# Patient Record
Sex: Female | Born: 1950
Health system: Southern US, Community
[De-identification: ages and names within clinical notes are randomized; demographics above are authoritative.]

## PROBLEM LIST (undated history)

## (undated) DIAGNOSIS — E669 Obesity, unspecified: Secondary | ICD-10-CM

## (undated) DIAGNOSIS — I4892 Unspecified atrial flutter: Secondary | ICD-10-CM

## (undated) DIAGNOSIS — I517 Cardiomegaly: Secondary | ICD-10-CM

## (undated) DIAGNOSIS — I10 Essential (primary) hypertension: Secondary | ICD-10-CM

## (undated) DIAGNOSIS — I639 Cerebral infarction, unspecified: Secondary | ICD-10-CM

## (undated) DIAGNOSIS — I4891 Unspecified atrial fibrillation: Secondary | ICD-10-CM

## (undated) HISTORY — DX: Cerebral infarction, unspecified: I63.9

## (undated) HISTORY — DX: Unspecified atrial flutter: I48.92

## (undated) HISTORY — DX: Cardiomegaly: I51.7

## (undated) HISTORY — PX: ABDOMINAL HYSTERECTOMY: SHX81

## (undated) HISTORY — DX: Essential (primary) hypertension: I10

## (undated) HISTORY — DX: Unspecified atrial fibrillation: I48.91

## (undated) HISTORY — DX: Obesity, unspecified: E66.9

---

## 1998-02-22 ENCOUNTER — Inpatient Hospital Stay (HOSPITAL_COMMUNITY): Admission: EM | Admit: 1998-02-22 | Discharge: 1998-02-23 | Payer: Self-pay | Admitting: *Deleted

## 1998-03-30 ENCOUNTER — Inpatient Hospital Stay (HOSPITAL_COMMUNITY): Admission: RE | Admit: 1998-03-30 | Discharge: 1998-04-01 | Payer: Self-pay | Admitting: Obstetrics and Gynecology

## 1999-11-05 ENCOUNTER — Encounter: Payer: Self-pay | Admitting: Obstetrics and Gynecology

## 1999-11-05 ENCOUNTER — Encounter: Admission: RE | Admit: 1999-11-05 | Discharge: 1999-11-05 | Payer: Self-pay | Admitting: Obstetrics and Gynecology

## 2001-01-19 ENCOUNTER — Encounter: Admission: RE | Admit: 2001-01-19 | Discharge: 2001-01-19 | Payer: Self-pay | Admitting: Obstetrics and Gynecology

## 2001-01-19 ENCOUNTER — Encounter: Payer: Self-pay | Admitting: Obstetrics and Gynecology

## 2002-01-25 ENCOUNTER — Encounter: Payer: Self-pay | Admitting: Obstetrics and Gynecology

## 2002-01-25 ENCOUNTER — Encounter: Admission: RE | Admit: 2002-01-25 | Discharge: 2002-01-25 | Payer: Self-pay | Admitting: Obstetrics and Gynecology

## 2002-10-24 ENCOUNTER — Encounter: Payer: Self-pay | Admitting: Emergency Medicine

## 2002-10-24 ENCOUNTER — Emergency Department (HOSPITAL_COMMUNITY): Admission: EM | Admit: 2002-10-24 | Discharge: 2002-10-24 | Payer: Self-pay | Admitting: Emergency Medicine

## 2003-01-31 ENCOUNTER — Encounter: Admission: RE | Admit: 2003-01-31 | Discharge: 2003-01-31 | Payer: Self-pay | Admitting: Obstetrics and Gynecology

## 2003-01-31 ENCOUNTER — Encounter: Payer: Self-pay | Admitting: Obstetrics and Gynecology

## 2004-02-13 ENCOUNTER — Ambulatory Visit (HOSPITAL_COMMUNITY): Admission: RE | Admit: 2004-02-13 | Discharge: 2004-02-13 | Payer: Self-pay | Admitting: Obstetrics and Gynecology

## 2005-03-04 ENCOUNTER — Ambulatory Visit (HOSPITAL_COMMUNITY): Admission: RE | Admit: 2005-03-04 | Discharge: 2005-03-04 | Payer: Self-pay | Admitting: Obstetrics and Gynecology

## 2006-03-10 ENCOUNTER — Ambulatory Visit (HOSPITAL_COMMUNITY): Admission: RE | Admit: 2006-03-10 | Discharge: 2006-03-10 | Payer: Self-pay | Admitting: Obstetrics and Gynecology

## 2007-03-16 ENCOUNTER — Ambulatory Visit (HOSPITAL_COMMUNITY): Admission: RE | Admit: 2007-03-16 | Discharge: 2007-03-16 | Payer: Self-pay | Admitting: Obstetrics and Gynecology

## 2008-03-21 ENCOUNTER — Ambulatory Visit (HOSPITAL_COMMUNITY): Admission: RE | Admit: 2008-03-21 | Discharge: 2008-03-21 | Payer: Self-pay | Admitting: Obstetrics and Gynecology

## 2008-05-22 ENCOUNTER — Ambulatory Visit: Payer: Self-pay | Admitting: Cardiology

## 2008-05-22 ENCOUNTER — Observation Stay (HOSPITAL_COMMUNITY): Admission: EM | Admit: 2008-05-22 | Discharge: 2008-05-23 | Payer: Self-pay | Admitting: Emergency Medicine

## 2008-05-30 ENCOUNTER — Ambulatory Visit: Payer: Self-pay | Admitting: Cardiology

## 2008-05-30 ENCOUNTER — Encounter: Payer: Self-pay | Admitting: Cardiology

## 2008-05-30 ENCOUNTER — Ambulatory Visit: Payer: Self-pay

## 2008-05-30 LAB — CONVERTED CEMR LAB
BUN: 12 mg/dL (ref 6–23)
CO2: 30 meq/L (ref 19–32)
Calcium: 9.4 mg/dL (ref 8.4–10.5)
Chloride: 104 meq/L (ref 96–112)
Potassium: 3.9 meq/L (ref 3.5–5.1)
Sodium: 140 meq/L (ref 135–145)

## 2008-06-16 ENCOUNTER — Ambulatory Visit: Payer: Self-pay | Admitting: Cardiology

## 2008-10-12 ENCOUNTER — Ambulatory Visit: Payer: Self-pay | Admitting: Cardiology

## 2008-10-12 DIAGNOSIS — I471 Supraventricular tachycardia, unspecified: Secondary | ICD-10-CM | POA: Insufficient documentation

## 2008-10-12 DIAGNOSIS — I10 Essential (primary) hypertension: Secondary | ICD-10-CM | POA: Insufficient documentation

## 2008-11-10 ENCOUNTER — Encounter: Payer: Self-pay | Admitting: Cardiology

## 2008-11-10 ENCOUNTER — Ambulatory Visit: Payer: Self-pay | Admitting: Cardiology

## 2008-11-10 ENCOUNTER — Encounter (INDEPENDENT_AMBULATORY_CARE_PROVIDER_SITE_OTHER): Payer: Self-pay

## 2008-11-10 DIAGNOSIS — E669 Obesity, unspecified: Secondary | ICD-10-CM | POA: Insufficient documentation

## 2008-12-29 ENCOUNTER — Encounter: Payer: Self-pay | Admitting: Cardiology

## 2008-12-29 ENCOUNTER — Ambulatory Visit: Payer: Self-pay | Admitting: Cardiology

## 2009-01-27 ENCOUNTER — Telehealth (INDEPENDENT_AMBULATORY_CARE_PROVIDER_SITE_OTHER): Payer: Self-pay | Admitting: *Deleted

## 2009-02-02 ENCOUNTER — Telehealth: Payer: Self-pay | Admitting: Cardiology

## 2009-02-15 ENCOUNTER — Telehealth: Payer: Self-pay | Admitting: Cardiology

## 2009-03-09 ENCOUNTER — Ambulatory Visit: Payer: Self-pay | Admitting: Cardiology

## 2009-03-09 DIAGNOSIS — I4891 Unspecified atrial fibrillation: Secondary | ICD-10-CM | POA: Insufficient documentation

## 2009-03-09 LAB — CONVERTED CEMR LAB
Chloride: 105 meq/L (ref 96–112)
Creatinine, Ser: 0.8 mg/dL (ref 0.4–1.2)
GFR calc non Af Amer: 95.1 mL/min (ref >60)
Potassium: 4.2 meq/L (ref 3.5–5.1)

## 2009-03-23 ENCOUNTER — Ambulatory Visit (HOSPITAL_COMMUNITY): Admission: RE | Admit: 2009-03-23 | Discharge: 2009-03-23 | Payer: Self-pay | Admitting: Obstetrics and Gynecology

## 2009-04-10 ENCOUNTER — Ambulatory Visit: Payer: Self-pay | Admitting: Cardiology

## 2009-05-12 ENCOUNTER — Telehealth: Payer: Self-pay | Admitting: Cardiology

## 2009-05-18 ENCOUNTER — Telehealth: Payer: Self-pay | Admitting: Cardiology

## 2009-05-23 ENCOUNTER — Ambulatory Visit: Payer: Self-pay | Admitting: Cardiology

## 2009-05-23 ENCOUNTER — Encounter: Payer: Self-pay | Admitting: Cardiology

## 2009-05-23 LAB — CONVERTED CEMR LAB: POC INR: 2

## 2009-05-29 ENCOUNTER — Ambulatory Visit: Payer: Self-pay | Admitting: Internal Medicine

## 2009-05-29 LAB — CONVERTED CEMR LAB: POC INR: 2

## 2009-06-05 ENCOUNTER — Ambulatory Visit: Payer: Self-pay | Admitting: Cardiology

## 2009-06-12 ENCOUNTER — Ambulatory Visit: Payer: Self-pay | Admitting: Cardiology

## 2009-06-19 ENCOUNTER — Ambulatory Visit: Payer: Self-pay | Admitting: Internal Medicine

## 2009-07-03 ENCOUNTER — Ambulatory Visit: Payer: Self-pay | Admitting: Cardiovascular Disease

## 2009-07-03 LAB — CONVERTED CEMR LAB: POC INR: 2

## 2009-07-10 ENCOUNTER — Ambulatory Visit: Payer: Self-pay | Admitting: Cardiology

## 2009-07-17 ENCOUNTER — Ambulatory Visit: Payer: Self-pay | Admitting: Internal Medicine

## 2009-07-24 ENCOUNTER — Ambulatory Visit: Payer: Self-pay | Admitting: Cardiology

## 2009-07-28 ENCOUNTER — Encounter: Payer: Self-pay | Admitting: Cardiology

## 2009-07-28 ENCOUNTER — Telehealth: Payer: Self-pay | Admitting: Cardiology

## 2009-07-31 ENCOUNTER — Ambulatory Visit: Payer: Self-pay | Admitting: Internal Medicine

## 2009-07-31 LAB — CONVERTED CEMR LAB: POC INR: 2.2

## 2009-08-03 ENCOUNTER — Encounter (INDEPENDENT_AMBULATORY_CARE_PROVIDER_SITE_OTHER): Payer: Self-pay | Admitting: Nurse Practitioner

## 2009-08-07 ENCOUNTER — Ambulatory Visit (HOSPITAL_COMMUNITY): Admission: RE | Admit: 2009-08-07 | Discharge: 2009-08-07 | Payer: Self-pay | Admitting: Cardiology

## 2009-08-07 ENCOUNTER — Encounter: Payer: Self-pay | Admitting: Cardiology

## 2009-08-14 ENCOUNTER — Ambulatory Visit: Payer: Self-pay | Admitting: Cardiology

## 2009-08-14 LAB — CONVERTED CEMR LAB: POC INR: 2.1

## 2009-08-21 ENCOUNTER — Encounter (INDEPENDENT_AMBULATORY_CARE_PROVIDER_SITE_OTHER): Payer: Self-pay | Admitting: Cardiology

## 2009-08-21 ENCOUNTER — Ambulatory Visit: Payer: Self-pay | Admitting: Cardiology

## 2009-08-28 ENCOUNTER — Ambulatory Visit: Payer: Self-pay | Admitting: Cardiovascular Disease

## 2009-08-28 LAB — CONVERTED CEMR LAB: POC INR: 2.9

## 2009-09-04 ENCOUNTER — Ambulatory Visit: Payer: Self-pay | Admitting: Cardiology

## 2009-09-04 LAB — CONVERTED CEMR LAB: POC INR: 2.5

## 2009-09-05 ENCOUNTER — Telehealth (INDEPENDENT_AMBULATORY_CARE_PROVIDER_SITE_OTHER): Payer: Self-pay | Admitting: *Deleted

## 2009-09-06 ENCOUNTER — Telehealth: Payer: Self-pay | Admitting: Cardiology

## 2009-09-07 ENCOUNTER — Telehealth: Payer: Self-pay | Admitting: Cardiology

## 2009-09-07 ENCOUNTER — Ambulatory Visit: Payer: Self-pay | Admitting: Cardiology

## 2009-09-07 ENCOUNTER — Ambulatory Visit (HOSPITAL_COMMUNITY): Admission: RE | Admit: 2009-09-07 | Discharge: 2009-09-07 | Payer: Self-pay | Admitting: Cardiology

## 2009-09-11 ENCOUNTER — Ambulatory Visit: Payer: Self-pay | Admitting: Internal Medicine

## 2009-09-11 ENCOUNTER — Encounter (INDEPENDENT_AMBULATORY_CARE_PROVIDER_SITE_OTHER): Payer: Self-pay | Admitting: *Deleted

## 2009-09-11 LAB — CONVERTED CEMR LAB: POC INR: 2.2

## 2009-09-18 ENCOUNTER — Telehealth: Payer: Self-pay | Admitting: Cardiology

## 2009-09-19 ENCOUNTER — Inpatient Hospital Stay (HOSPITAL_COMMUNITY): Admission: AD | Admit: 2009-09-19 | Discharge: 2009-09-22 | Payer: Self-pay | Admitting: Cardiology

## 2009-09-19 ENCOUNTER — Ambulatory Visit: Payer: Self-pay | Admitting: Cardiology

## 2009-09-20 ENCOUNTER — Encounter: Payer: Self-pay | Admitting: Cardiology

## 2009-09-25 ENCOUNTER — Telehealth: Payer: Self-pay | Admitting: Cardiology

## 2009-09-27 ENCOUNTER — Telehealth: Payer: Self-pay | Admitting: Cardiology

## 2009-10-02 ENCOUNTER — Ambulatory Visit: Payer: Self-pay | Admitting: Cardiology

## 2009-10-17 ENCOUNTER — Encounter (INDEPENDENT_AMBULATORY_CARE_PROVIDER_SITE_OTHER): Payer: Self-pay | Admitting: *Deleted

## 2009-10-17 ENCOUNTER — Ambulatory Visit: Payer: Self-pay | Admitting: Cardiology

## 2009-10-30 ENCOUNTER — Ambulatory Visit: Payer: Self-pay | Admitting: Internal Medicine

## 2009-10-30 LAB — CONVERTED CEMR LAB: POC INR: 2.2

## 2009-11-01 ENCOUNTER — Telehealth: Payer: Self-pay | Admitting: Cardiology

## 2009-11-27 ENCOUNTER — Ambulatory Visit: Payer: Self-pay | Admitting: Cardiology

## 2009-12-26 ENCOUNTER — Ambulatory Visit: Payer: Self-pay | Admitting: Cardiovascular Disease

## 2009-12-26 ENCOUNTER — Ambulatory Visit: Payer: Self-pay | Admitting: Cardiology

## 2009-12-26 LAB — CONVERTED CEMR LAB
Calcium: 9.1 mg/dL (ref 8.4–10.5)
Magnesium: 1.9 mg/dL (ref 1.5–2.5)
POC INR: 2
Potassium: 3.7 meq/L (ref 3.5–5.1)

## 2010-02-06 ENCOUNTER — Encounter: Payer: Self-pay | Admitting: Internal Medicine

## 2010-02-15 ENCOUNTER — Telehealth: Payer: Self-pay | Admitting: Cardiology

## 2010-02-20 ENCOUNTER — Encounter: Payer: Self-pay | Admitting: Cardiology

## 2010-02-26 ENCOUNTER — Telehealth: Payer: Self-pay | Admitting: Cardiology

## 2010-03-29 ENCOUNTER — Ambulatory Visit (HOSPITAL_COMMUNITY): Admission: RE | Admit: 2010-03-29 | Discharge: 2010-03-29 | Payer: Self-pay | Admitting: Obstetrics and Gynecology

## 2010-03-29 LAB — HM MAMMOGRAPHY: HM Mammogram: NORMAL

## 2010-07-03 ENCOUNTER — Encounter: Payer: Self-pay | Admitting: Cardiology

## 2010-07-03 ENCOUNTER — Ambulatory Visit: Payer: Self-pay | Admitting: Cardiology

## 2010-07-13 ENCOUNTER — Ambulatory Visit (HOSPITAL_COMMUNITY): Admission: RE | Admit: 2010-07-13 | Discharge: 2010-07-13 | Payer: Self-pay | Admitting: Cardiology

## 2010-07-13 ENCOUNTER — Encounter: Payer: Self-pay | Admitting: Cardiology

## 2010-07-13 ENCOUNTER — Ambulatory Visit: Payer: Self-pay

## 2010-07-13 ENCOUNTER — Ambulatory Visit: Payer: Self-pay | Admitting: Internal Medicine

## 2010-09-24 ENCOUNTER — Telehealth (INDEPENDENT_AMBULATORY_CARE_PROVIDER_SITE_OTHER): Payer: Self-pay | Admitting: *Deleted

## 2010-10-16 NOTE — Letter (Signed)
Summary: [employer]  [employer]   Imported By: Marylou Mccoy 11/02/2009 09:18:03  _____________________________________________________________________  External Attachment:    Type:   Image     Comment:   External Document

## 2010-10-16 NOTE — Medication Information (Signed)
Summary: [email or web address].mp  Anticoagulant Therapy  Managed by: Bethena Midget, RN, BSN Referring MD: Antoine Poche MD, Fayrene Fearing PCP: Alyce Pagan, MD Supervising MD: Jens Som MD, Arlys John Indication 1: Atrial Fibrillation Lab Used: [facility] [place] Site: [address] INR POC 2.2 INR RANGE 2.0-3.0  Dietary changes: no    Health status changes: no    Bleeding/hemorrhagic complications: no    Recent/future hospitalizations: no    Any changes in medication regimen? yes       Details: Ran out of Tikosyn, took last dose on Sunday AM. Schedule for BID. Ordered med pharmacy ran out  Recent/future dental: no  Any missed doses?: no       Is patient compliant with meds? yes       Allergies: No Known Drug Allergies  Anticoagulation Management History:      The patient is taking warfarin and comes in today for a routine follow up visit.  Negative risk factors for bleeding include an age less than 13 years old.  The bleeding index is 'low risk'.  Positive CHADS2 values include History of HTN.  Negative CHADS2 values include Age > 1 years old.  Anticoagulation responsible provider: Jens Som MD, Arlys John.  INR POC: 2.2.  Cuvette Lot#: [id number].  Exp: 12/2010.    Anticoagulation Management Assessment/Plan:      The patient's current anticoagulation dose is Coumadin 5 mg tabs: Take as directed by coumadin clinic..  The target INR is 2.0-3.0.  The next INR is due 11/27/2009.  Anticoagulation instructions were given to patient.  Results were reviewed/authorized by Bethena Midget, RN, BSN.  She was notified by Bethena Midget, RN, BSN.         Prior Anticoagulation Instructions: INR 2.8  Continue same dose of 1.5 tablets daily except 2 tablets on Mondays, Wednesdays, and Fridays. Recheck in 4 weeks.  Current Anticoagulation Instructions: INR 2.2 7.5mg s everyday excpet 10mg s on Mondays ,Wednesdays and Fridays. Recheck in 4 weeks.   Appended Document: rov.mp    Prescriptions: TIKOSYN 250 MCG CAPS  (DOFETILIDE) Take one capsule by mouth twice a day  #14 x 0   Entered by:   Bethena Midget, RN, BSN   Authorized by:   Shelby Dubin PharmD, BCPS, CPP   Signed by:   Bethena Midget, RN, BSN on 10/30/2009   Method used:   Samples Given   RxID:   [id number]  Tikosyn 250 micrograms caps #14 given to patient b/c retail pharmacy ran out and was unable to obtain prio r to patient missing 10/29/09 pm dose.  D/W Dr. Shirlee Latch who agreed to obtain sample and ask patient to resume normal dosing scheme.  Lot # [id number] / exp: 01/2011.  Pt verbalizes understanding of risks/benefits and agrees to continue dosing.  WCB in future if pharmacy not able to obtain.Shelby Dubin, PharmD

## 2010-10-16 NOTE — Letter (Signed)
Summary: Work Writer, Main Office  [address]   [place], [place] [address]   Phone: [phone]  Fax: [phone]         July 03, 2010    [facility]   The above named patient had a medical visit today 07/03/2010  Please take this into consideration when reviewing the time away from work/school.      Sincerely yours,     Sander Nephew, RN for Dr Rollene Rotunda [facility]

## 2010-10-16 NOTE — Medication Information (Signed)
Summary: [facility]  Anticoagulant Therapy  Managed by: Lew Dawes, PharmD Candidate Referring MD: Antoine Poche MD, Fayrene Fearing PCP: Alyce Pagan, MD Supervising MD: Jens Som MD, Arlys John Indication 1: Atrial Fibrillation Lab Used: [facility] [place] Site: [address] INR POC 2.8 INR RANGE 2.0-3.0  Dietary changes: no    Health status changes: no    Bleeding/hemorrhagic complications: no    Recent/future hospitalizations: yes       Details: Patient started Tikosyn. Was in hospital 1/4-1/7.  Any changes in medication regimen? yes       Details: Tikosyn bid.  Recent/future dental: no  Any missed doses?: no       Is patient compliant with meds? yes       Current Medications (verified): 1)  Benicar 40 Mg Tabs (Olmesartan Medoxomil) .... Take One Tablet By Mouth Daily 2)  Premarin 0.625 Mg Tabs (Estrogens Conjugated) .... One By Mouth Daily 3)  Coumadin 5 Mg Tabs (Warfarin Sodium) .... Take As Directed By Coumadin Clinic. 4)  Tikosyn 250 Mcg Caps (Dofetilide) .... Take One Capsule By Mouth Twice A Day  Allergies (verified): No Known Drug Allergies  Anticoagulation Management History:      The patient is taking warfarin and comes in today for a routine follow up visit.  Negative risk factors for bleeding include an age less than 57 years old.  The bleeding index is 'low risk'.  Positive CHADS2 values include History of HTN.  Negative CHADS2 values include Age > 45 years old.  Anticoagulation responsible provider: Jens Som MD, Arlys John.  INR POC: 2.8.  Cuvette Lot#: [id number].  Exp: 12/2010.    Anticoagulation Management Assessment/Plan:      The patient's current anticoagulation dose is Coumadin 5 mg tabs: Take as directed by coumadin clinic..  The target INR is 2.0-3.0.  The next INR is due 10/30/2009.  Anticoagulation instructions were given to patient.  Results were reviewed/authorized by Lew Dawes, PharmD Candidate.  She was notified by Lew Dawes, PharmD Candidate.         Prior Anticoagulation Instructions: INR 2.2  The patient is to continue with the same dose of coumadin.  This dosage includes: 1.5 tablets daily except 2 tablets on Monday, Wednesday, and Friday.  Pt will be admitted to the hospital on 09/19/09 to start Tikosyn.   Current Anticoagulation Instructions: INR 2.8  Continue same dose of 1.5 tablets daily except 2 tablets on Mondays, Wednesdays, and Fridays. Recheck in 4 weeks.

## 2010-10-16 NOTE — Medication Information (Signed)
Summary: [facility] Clinic  Anticoagulant Therapy  Managed by: Inactive Referring MD: Antoine Poche MD, Fayrene Fearing PCP: Alyce Pagan, MD Supervising MD: Excell Seltzer MD, Casimiro Needle Indication 1: Atrial Fibrillation Lab Used: [facility] [place] Site: [address] INR RANGE 2.0-3.0          Comments: Coumadin d/c'ed by Dr. Antoine Poche  Allergies: No Known Drug Allergies  Anticoagulation Management History:      Negative risk factors for bleeding include an age less than 59 years old.  The bleeding index is 'low risk'.  Positive CHADS2 values include History of HTN.  Negative CHADS2 values include Age > 59 years old.  Anticoagulation responsible provider: Excell Seltzer MD, Casimiro Needle.  Exp: 01/2011.    Anticoagulation Management Assessment/Plan:      The target INR is 2.0-3.0.  The next INR is due 01/23/2010.  Anticoagulation instructions were given to patient.  Results were reviewed/authorized by Inactive.         Prior Anticoagulation Instructions: INR 2.0  Continue on same dosage 1.5 tablets daily except 2 tablets on Mondays, Wednesdays, and Fridays.  Recheck in 4 weeks.

## 2010-10-16 NOTE — Progress Notes (Signed)
Summary: follow up letter  Phone Note Call from Patient Call back at [phone]   Reason for Call: Talk to Nurse Summary of Call: request to speak to nurse about letter she received Initial call taken by: Migdalia Dk,  February 26, 2010 4:41 PM  Follow-up for Phone Call        pt states she is not having palps and hasn't since she called last.  She will call back if palps re-occur Follow-up by: Charolotte Capuchin, RN,  February 26, 2010 4:46 PM

## 2010-10-16 NOTE — Progress Notes (Signed)
Summary: SOB  Phone Note Call from Patient Call back at Home Phone [phone] Call back at [phone]   Caller: Patient Summary of Call: PT HAVING SOB . Initial call taken by: Judie Grieve,  November 01, 2009 2:48 PM  Follow-up for Phone Call        Called patient, she states that she had problems with a refill for Tikosyn and missed one dose on Sunday. She states she told the [facility] Clinic about it on Monday and they gave her some samples to get her by for a short time until [facility] got in a supply. She states that she got a little upset on Tuesday after dinner and got a little SOB for a few minutes. This happened again after breakfast today. She denies being SOB currently. She denies any swelling in abdomen,legs, feet or ankles. She states that her heart rhythm feels fine and regular. She states that she thinks she had episodes of SOB related to being upset. Advised her to call us again if she has recurrent episodes of SOB. Follow-up by: Suzan Garibaldi RN

## 2010-10-16 NOTE — Progress Notes (Signed)
Summary: CALLING ABOUT APPT IN THE HOSPITAL lm to cb  Phone Note Call from Patient Call back at Home Phone [phone] Call back at [phone]   Caller: Patient Summary of Call: PT WOULD LIKE A CALL ABOUT APPT IN THE HOSPITAL 09/19/08 Initial call taken by: Judie Grieve,  September 18, 2009 9:43 AM  Follow-up for Phone Call        LM to C/B  Sander Nephew, RN Spoke with pt, aware admitting office will call her at home when a bed is available and she will report then to admitting.  Pt states understanding  Follow-up by: Charolotte Capuchin, RN,  September 18, 2009 10:22 AM

## 2010-10-16 NOTE — Progress Notes (Signed)
Summary: Pt was having palp onTuesday  NEEDS EVENT MONITOR  Phone Note Call from Patient Call back at Home Phone [phone] Call back at [phone]   Caller: Patient Summary of Call: Pt having palp Initial call taken by: Judie Grieve,  February 15, 2010 11:35 AM  Follow-up for Phone Call        having "palpitations and beating real fast"  after lunch on Tuesday.  Lasted 5 seconds and then again for 5 - 10 sec.  Yesterday she rested, didnt go to work and didnt have any.  None today.  Medicine helps but doesn't take care of it completely.  No chest pain no SOB.  Pt would like Dr Antoine Poche to be aware and see if he wants her to change anything.  Will forward to Dr Antoine Poche Follow-up by: Charolotte Capuchin, RN,  February 15, 2010 12:15 PM  Additional Follow-up for Phone Call Additional follow up Details #1::        Check event monitor. Additional Follow-up by: Rollene Rotunda, MD, [facility],  February 15, 2010 6:07 PM    Additional Follow-up for Phone Call Additional follow up Details #2::    left message on voicemail to call back  Sander Nephew, RN  returning call [phone], call after 4:30, Migdalia Dk  February 16, 2010 2:58 PM   phone now does not accept incoming calls.  letter will be mailed Follow-up by: Charolotte Capuchin, RN,  February 20, 2010 6:00 PM

## 2010-10-16 NOTE — Assessment & Plan Note (Signed)
Summary: EPH/JML   Visit Type:  Follow-up Primary Provider:  Alyce Pagan, MD  CC:  Atrial Fibrillation.  History of Present Illness: The patient presents for followup of atrial fibrillation. She was hospitalized early in January for a tedious and load. She converted to sinus rhythm with this medication. She had previously failed direct current cardioversion. Since that time she has had a few episodes were she thought she was out of rhythm. One lasting about 40 minutes. Overall she feels much better. She has much more energy. She has been dieting and lost 10 pounds. She has been exercising. She has had no chest pressure, neck or arm discomfort. She has had no palpitations, presyncope or syncope. She has no PND or orthopnea. She has no shortness of breath.  Current Medications (verified): 1)  Benicar 40 Mg Tabs (Olmesartan Medoxomil) .... Take One Tablet By Mouth Daily 2)  Premarin 0.625 Mg Tabs (Estrogens Conjugated) .... One By Mouth Daily 3)  Coumadin 5 Mg Tabs (Warfarin Sodium) .... Take As Directed By Coumadin Clinic. 4)  Tikosyn 250 Mcg Caps (Dofetilide) .... Take One Capsule By Mouth Twice A Day  Allergies (verified): No Known Drug Allergies  Past History:  Past Medical History: Reviewed history from 04/10/2009 and no changes required. PAT (ICD-427.0) HYPERTENSION, BENIGN (ICD-401.1) Mild Anemia LVH mild Atrial Fibrillation  Past Surgical History: Reviewed history from 12/29/2008 and no changes required. Hysterectomy  Review of Systems       As stated in the HPI and negative for all other systems.   Vital Signs:  Patient profile:   60 year old female Height:      63 inches Weight:      168 pounds BMI:     29.87 Pulse rate:   73 / minute Resp:     16 per minute BP sitting:   157 / 87  (right arm)  Vitals Entered By: Marrion Coy, CNA (October 17, 2009 8:52 AM)  Physical Exam  General:  Well developed, well nourished, in no acute distress. Head:   normocephalic and atraumatic Eyes:  PERRLA/EOM intact; conjunctiva and lids normal. Mouth:  Upper dentures, lower partial, gums and palate normal. Oral mucosa normal. Neck:  Neck supple, no JVD. No masses, thyromegaly or abnormal cervical nodes. Lungs:  Clear bilaterally to auscultation and percussion. Abdomen:  Bowel sounds positive; abdomen soft and non-tender without masses, organomegaly, or hernias noted. No hepatosplenomegaly. Msk:  Back normal, normal gait. Muscle strength and tone normal. Extremities:  No clubbing or cyanosis. Neurologic:  Alert and oriented x 3. Skin:  Intact without lesions or rashes. Psych:  Normal affect.   Detailed Cardiovascular Exam  Neck    Carotids: Carotids full and equal bilaterally without bruits.      Neck Veins: Normal, no JVD.    Heart    Inspection: no deformities or lifts noted.      Palpation: normal PMI with no thrills palpable.      Auscultation: regular rate and rhythm, S1, S2 without murmurs, rubs, gallops, or clicks.    Vascular    Abdominal Aorta: no palpable masses, pulsations, or audible bruits.      Femoral Pulses: normal femoral pulses bilaterally.      Pedal Pulses: normal pedal pulses bilaterally.      Radial Pulses: normal radial pulses bilaterally.      Peripheral Circulation: no clubbing, cyanosis, or edema noted with normal capillary refill.     EKG  Procedure date:  10/17/2009  Findings:  Sinus rhythm, premature atrial contractions, QTC 440 unchanged from posthospitalization, lateral deep T-wave inversions unchanged from previous  Impression & Recommendations:  Problem # 1:  ATRIAL FIBRILLATION (ICD-427.31)  The patient is tolerating Tikosyn and is maintaining sinus rhythm though she may have occasional breakthroughs of fibrillation. For now I will continue Coumadin. She will continue the current dose of Tikosyn.  No further evaluation is needed at this time.  Orders: EKG w/ Interpretation (93000)  Problem  # 2:  HYPERTENSION, BENIGN (ICD-401.1) Her blood pressure is slightly elevated. However, this has not been the case at recent visits or during her hospitalization. I have asked her to get a blood pressure cuff. I will not make any further changes.  Patient Instructions: 1)  Your physician recommends that you schedule a follow-up appointment in: 4 months with Dr Antoine Poche 2)  Your physician recommends that you continue on your current medications as directed. Please refer to the Current Medication list given to you today. 3)  You have been diagnosed with atrial fibrillation.  Atrial fibrillation is a condition in which one of the upper chambers of the heart has extra electrical cells causing it to beat very fast.  Please see the handout/brochure given to you today for further information.

## 2010-10-16 NOTE — Letter (Signed)
Summary: Work Writer, Main Office  [address]   [place], [place] [address]   Phone: [phone]  Fax: [phone]         October 17, 2009    [facility]   The above named patient had a medical visit today at: 8:45 am  Please take this into consideration when reviewing the time away from work/school.      Sincerely yours,     Dr Rollene Rotunda [facility]

## 2010-10-16 NOTE — Progress Notes (Signed)
Summary: returning call  Phone Note Call from Patient Call back at Home Phone [phone]   Caller: Patient Summary of Call: returning call, did receive her tikosyn Initial call taken by: Migdalia Dk,  September 27, 2009 3:02 PM

## 2010-10-16 NOTE — Medication Information (Signed)
Summary: rov.m  Anticoagulant Therapy  Managed by: Eda Keys, PharmD Referring MD: Antoine Poche MD, Fayrene Fearing PCP: Alyce Pagan, MD Supervising MD: Myrtis Ser MD, Tinnie Gens Indication 1: Atrial Fibrillation Lab Used: [facility] [place] Site: [address] INR POC 2.5 INR RANGE 2.0-3.0  Dietary changes: no    Health status changes: no    Bleeding/hemorrhagic complications: no    Recent/future hospitalizations: no    Any changes in medication regimen? no    Recent/future dental: no  Any missed doses?: no       Is patient compliant with meds? yes      Comments: Pt has been experiencing increased SOB since starting Tikosyn.  Pt is making f/u appt with cardiologist and will discuss this at that time.    Allergies: No Known Drug Allergies  Anticoagulation Management History:      The patient is taking warfarin and comes in today for a routine follow up visit.  Negative risk factors for bleeding include an age less than 54 years old.  The bleeding index is 'low risk'.  Positive CHADS2 values include History of HTN.  Negative CHADS2 values include Age > 27 years old.  Anticoagulation responsible provider: Myrtis Ser MD, Tinnie Gens.  INR POC: 2.5.  Cuvette Lot#: [id number].  Exp: 01/2011.    Anticoagulation Management Assessment/Plan:      The patient's current anticoagulation dose is Coumadin 5 mg tabs: Take as directed by coumadin clinic..  The target INR is 2.0-3.0.  The next INR is due 12/25/2009.  Anticoagulation instructions were given to patient.  Results were reviewed/authorized by Eda Keys, PharmD.  She was notified by Eda Keys.         Prior Anticoagulation Instructions: INR 2.2 7.5mg s everyday excpet 10mg s on Mondays ,Wednesdays and Fridays. Recheck in 4 weeks.   Current Anticoagulation Instructions: INR 2.5  Continue taking 2 tablets on monday, Wednesday, and Friday and 1.5 tablets all other days.  Return to clinic in 4 weeks.

## 2010-10-16 NOTE — Progress Notes (Signed)
Summary: pt cant get tykosin filled  Phone Note Call from Patient Call back at [phone]   Caller: Patient Reason for Call: Talk to Nurse, Talk to Doctor Summary of Call: pt cant get tykosin what can she do [facility] to [facility] Initial call taken by: Omer Jack,  September 25, 2009 4:59 PM  Follow-up for Phone Call        spoke w/pt she [facility] [address] on fr[facility] and they were suppose to order it, today when she went to pick it up they told her they are not autherized to order it, they called around and another [facility] is [facility] it and they have faxed rx over there, pt has enough for tonight and tom, and she is not sure how much more she has, if [facility] doesn't get it tom she will call back. Follow-up by: Meredith Staggers, RN,  September 25, 2009 5:23 PM     Appended Document: pt cant get tykosin filled left message for pt to see if she got her Tikosyn filled and to call back to let me know if she did.

## 2010-10-16 NOTE — Medication Information (Signed)
Summary: rov/eac  Anticoagulant Therapy  Managed by: Cloyde Reams, RN, BSN Referring MD: Antoine Poche MD, Fayrene Fearing PCP: Alyce Pagan, MD Supervising MD: Excell Seltzer MD, Casimiro Needle Indication 1: Atrial Fibrillation Lab Used: [facility] [place] Site: [address] INR POC 2.0 INR RANGE 2.0-3.0  Dietary changes: no    Health status changes: no    Bleeding/hemorrhagic complications: no    Recent/future hospitalizations: no    Any changes in medication regimen? no    Recent/future dental: yes     Details: Scheduled for DDS cleaning next week.  Any missed doses?: no       Is patient compliant with meds? yes       Allergies (verified): No Known Drug Allergies  Anticoagulation Management History:      The patient is taking warfarin and comes in today for a routine follow up visit.  Negative risk factors for bleeding include an age less than 53 years old.  The bleeding index is 'low risk'.  Positive CHADS2 values include History of HTN.  Negative CHADS2 values include Age > 24 years old.  Anticoagulation responsible provider: Excell Seltzer MD, Casimiro Needle.  INR POC: 2.0.  Cuvette Lot#: [id number].  Exp: 01/2011.    Anticoagulation Management Assessment/Plan:      The patient's current anticoagulation dose is Coumadin 5 mg tabs: Take as directed by coumadin clinic..  The target INR is 2.0-3.0.  The next INR is due 01/23/2010.  Anticoagulation instructions were given to patient.  Results were reviewed/authorized by Cloyde Reams, RN, BSN.  She was notified by Cloyde Reams RN.         Prior Anticoagulation Instructions: INR 2.5  Continue taking 2 tablets on monday, Wednesday, and Friday and 1.5 tablets all other days.  Return to clinic in 4 weeks.   Current Anticoagulation Instructions: INR 2.0  Continue on same dosage 1.5 tablets daily except 2 tablets on Mondays, Wednesdays, and Fridays.  Recheck in 4 weeks.

## 2010-10-16 NOTE — Assessment & Plan Note (Signed)
Summary: 6 month rov  427.31  HCM   Visit Type:  Follow-up Referring Provider:  Rollene Rotunda Primary Provider:  Alyce Pagan, MD  CC:  Atrial Fibrillation.  History of Present Illness: The patient presents for followup of the above. Since I last saw her she's had a little epigastric discomfort with burping she associates with certain foods. This hasn't happened in a while and was happening when she was eating tomato-based foods. She has felt some rare palpitations but no sustained symptoms she hadn't she was in fibrillation. She has had no chest pressure, neck or arm discomfort. She has had no new shortness of breath, PND or orthopnea.  Current Medications (verified): 1)  Benicar 40 Mg Tabs (Olmesartan Medoxomil) .... Take One Tablet By Mouth Daily 2)  Premarin 0.625 Mg Tabs (Estrogens Conjugated) .... One By Mouth Daily 3)  Tikosyn 250 Mcg Caps (Dofetilide) .... Take One Capsule By Mouth Twice A Day  Allergies (verified): No Known Drug Allergies  Past History:  Past Medical History: Reviewed history from 04/10/2009 and no changes required. PAT (ICD-427.0) HYPERTENSION, BENIGN (ICD-401.1) Mild Anemia LVH mild Atrial Fibrillation  Past Surgical History: Reviewed history from 12/29/2008 and no changes required. Hysterectomy  Review of Systems       As stated in the HPI and negative for all other systems.   Vital Signs:  Patient profile:   60 year old female Height:      63 inches Weight:      166 pounds BMI:     29.51 Pulse rate:   59 / minute Resp:     16 per minute BP sitting:   130 / 74  (right arm)  Vitals Entered By: Marrion Coy, CNA (July 03, 2010 9:50 AM)  Physical Exam  General:  Well developed, well nourished, in no acute distress. Head:  normocephalic and atraumatic Eyes:  PERRLA/EOM intact; conjunctiva and lids normal. Mouth:  Upper dentures, lower partial, gums and palate normal. Oral mucosa normal. Neck:  Neck supple, no JVD. No masses,  thyromegaly or abnormal cervical nodes. Chest Wall:  no deformities or breast masses noted Lungs:  Clear bilaterally to auscultation and percussion. Abdomen:  Bowel sounds positive; abdomen soft and non-tender without masses, organomegaly, or hernias noted. No hepatosplenomegaly. Msk:  Back normal, normal gait. Muscle strength and tone normal. Extremities:  No clubbing or cyanosis. Neurologic:  Alert and oriented x 3. Skin:  Intact without lesions or rashes. Cervical Nodes:  no significant adenopathy Inguinal Nodes:  no significant adenopathy Psych:  Normal affect.   Detailed Cardiovascular Exam  Neck    Carotids: Carotids full and equal bilaterally without bruits.      Neck Veins: Normal, no JVD.    Heart    Inspection: no deformities or lifts noted.      Palpation: normal PMI with no thrills palpable.      Auscultation: regular rate and rhythm, S1, S2 without murmurs, rubs, gallops, or clicks.    Vascular    Abdominal Aorta: no palpable masses, pulsations, or audible bruits.      Femoral Pulses: normal femoral pulses bilaterally.      Pedal Pulses: normal pedal pulses bilaterally.      Radial Pulses: normal radial pulses bilaterally.      Peripheral Circulation: no clubbing, cyanosis, or edema noted with normal capillary refill.     Impression & Recommendations:  Problem # 1:  ATRIAL FIBRILLATION (ICD-427.31) She is maintaining sinus rhythm. She tolerates Tikosyn.  No change in therapy  is indicated. Orders: EKG w/ Interpretation (93000) Echocardiogram (Echo)  Problem # 2:  HYPERTROPHIC CARDIOMYOPATHY (ICD-425.1) It has been 2 years since the last echo I will repeat this to evaluate her hypertrophic cardiomyopathy. Orders: Echocardiogram (Echo)  Problem # 3:  HYPERTENSION, BENIGN (ICD-401.1) Her blood pressure is well controlled. She will continue the meds as listed.  Patient Instructions: 1)  Your physician recommends that you schedule a follow-up appointment in: 6  months with Dr Antoine Poche 2)  Your physician recommends that you continue on your current medications as directed. Please refer to the Current Medication list given to you today. 3)  Your physician has requested that you have an echocardiogram.  Echocardiography is a painless test that uses sound waves to create images of your heart. It provides your doctor with information about the size and shape of your heart and how well your heart's chambers and valves are working.  This procedure takes approximately one hour. There are no restrictions for this procedure.

## 2010-10-16 NOTE — Letter (Signed)
Summary: [employer] Authorization  [facility] Authorization   Imported By: Roderic Ovens 10/17/2009 12:27:24  _____________________________________________________________________  External Attachment:    Type:   Image     Comment:   External Document

## 2010-10-16 NOTE — Assessment & Plan Note (Signed)
Summary: 4 month rov 427.31  pfh,rn   Visit Type:  Follow-up Primary Provider:  Alyce Pagan, MD  CC:  Atrial Fibrillation.  History of Present Illness: The patient presents for followup of atrial fibrillation. Since I last saw her she has done relatively well. She said she did have some palpitations for about 3-1/2 hours last month. She's had some sporadic palpitations since then but none in the last couple of weeks. She's had no presyncope or syncope. She has had no new shortness of breath, PND or orthopnea. She is not describing chest discomfort, neck or arm discomfort. She does get back muscle aches when she tries to exercise so she is not doing this routinely.  Current Medications (verified): 1)  Benicar 40 Mg Tabs (Olmesartan Medoxomil) .... Take One Tablet By Mouth Daily 2)  Premarin 0.625 Mg Tabs (Estrogens Conjugated) .... One By Mouth Daily 3)  Coumadin 5 Mg Tabs (Warfarin Sodium) .... Take As Directed By Coumadin Clinic. 4)  Tikosyn 250 Mcg Caps (Dofetilide) .... Take One Capsule By Mouth Twice A Day  Allergies (verified): No Known Drug Allergies  Past History:  Past Medical History: Reviewed history from 04/10/2009 and no changes required. PAT (ICD-427.0) HYPERTENSION, BENIGN (ICD-401.1) Mild Anemia LVH mild Atrial Fibrillation  Past Surgical History: Reviewed history from 12/29/2008 and no changes required. Hysterectomy  Review of Systems       As stated in the HPI and negative for all other systems.   Vital Signs:  Patient profile:   60 year old female Height:      63 inches Weight:      163 pounds BMI:     28.98 Pulse rate:   65 / minute Resp:     16 per minute BP sitting:   124 / 78  (right arm)  Vitals Entered By: Marrion Coy, CNA (December 26, 2009 10:48 AM)  Physical Exam  General:  Well developed, well nourished, in no acute distress. Head:  normocephalic and atraumatic Eyes:  PERRLA/EOM intact; conjunctiva and lids normal. Mouth:  Upper  dentures, lower partial, gums and palate normal. Oral mucosa normal. Neck:  Neck supple, no JVD. No masses, thyromegaly or abnormal cervical nodes. Chest Wall:  no deformities or breast masses noted Lungs:  Clear bilaterally to auscultation and percussion. Heart:  Non-displaced PMI, chest non-tender; irrregular rate and rhythm, S1, S2 without murmurs, rubs or gallops. Carotid upstroke normal, no bruit. Normal abdominal aortic size, no bruits. Femorals normal pulses, no bruits. Pedals normal pulses. No edema, no varicosities. Abdomen:  Bowel sounds positive; abdomen soft and non-tender without masses, organomegaly, or hernias noted. No hepatosplenomegaly. Msk:  Back normal, normal gait. Muscle strength and tone normal. Extremities:  No clubbing or cyanosis. Neurologic:  Alert and oriented x 3. Skin:  Intact without lesions or rashes. Cervical Nodes:  no significant adenopathy Axillary Nodes:  no significant adenopathy Inguinal Nodes:  no significant adenopathy Psych:  Normal affect.   EKG  Procedure date:  12/26/2009  Findings:      Sinus rhythm, rate 65, axis within normal limits, intervals within normal limits, QTC unchanged, and inferolateral T-wave inversions unchanged from previous EKGs.  Impression & Recommendations:  Problem # 1:  ATRIAL FIBRILLATION (ICD-427.31) At this point she is perhaps having slight short paroxysms of fibrillation though I cannot confirm this. There has been no sustained dysrhythmia. She has  no high-risk features for thromboembolism. Therefore, she will come off the Coumadin. She will remain on theTikosyn. Orders: EKG w/ Interpretation (93000)  Problem # 2:  HYPERTROPHIC CARDIOMYOPATHY (ICD-425.1) She has no symptoms related to this. I reviewed the echo from September of 2009. I will see her again in 6 months and consider followup echo.  Problem # 3:  HYPERTENSION, BENIGN (ICD-401.1)  Her blood pressure is controlled. She will continue the  Benicar. Orders: TLB-BMP (Basic Metabolic Panel-BMET) (80048-METABOL) TLB-Magnesium (Mg) (83735-MG)  Her updated medication list for this problem includes:    Benicar 40 Mg Tabs (Olmesartan medoxomil) .Marland Kitchen... Take one tablet by mouth daily  Patient Instructions: 1)  Your physician recommends that you schedule a follow-up appointment in: 6 MONTHS WITH DR [facility] 2)  Your physician recommends that you have lab work today  BMP and MG 427.31 v58.69 3)  Your physician has recommended you make the following change in your medication: stop Coumadin 4)  You have been diagnosed with atrial fibrillation.  Atrial fibrillation is a condition in which one of the upper chambers of the heart has extra electrical cells causing it to beat very fast.  Please see the handout/brochure given to you today for further information.

## 2010-10-16 NOTE — Letter (Signed)
Summary: unable to reach pt by phone for monitor  pfh,rn  [employer], Main Office  [address]   [place], [place] [address]   Phone: [phone]  Fax: [phone]            February 20, 2010 MRN: [id number]      [facility] [address] [place], [place]  [address]      Dear Ms. Hensley,    I have been unable to reach you by telephone to follow up with you about your palpitations.  Dr. Antoine Poche did order for you to wear a heart monitor so that he may be able to tell what is causing this.  Please call our office with a phone number where you may be reached.         Sincerely,      Charolotte Capuchin, RN for  Dr. Rollene Rotunda  This letter has been electronically signed by your physician.

## 2010-10-18 NOTE — Progress Notes (Signed)
----   Converted from flag ---- flag received   ---- 09/21/2010 1:08 PM, Bary Leriche wrote: Ward Givens, I am sending Dr. Antoine Poche a FMLA on his patient [name]  [birth year]ob October 15, 2050, to review.   Thank you,  Elease Hashimoto at [employer]. ------------------------------

## 2010-10-19 ENCOUNTER — Encounter: Payer: Self-pay | Admitting: Cardiology

## 2010-10-22 ENCOUNTER — Encounter: Payer: Self-pay | Admitting: Cardiology

## 2010-10-22 ENCOUNTER — Ambulatory Visit (INDEPENDENT_AMBULATORY_CARE_PROVIDER_SITE_OTHER): Payer: BC Managed Care – PPO | Admitting: Cardiology

## 2010-10-22 DIAGNOSIS — R9431 Abnormal electrocardiogram [ECG] [EKG]: Secondary | ICD-10-CM

## 2010-10-22 DIAGNOSIS — I4891 Unspecified atrial fibrillation: Secondary | ICD-10-CM

## 2010-10-24 NOTE — Miscellaneous (Signed)
  Clinical Lists Changes  Observations: Added new observation of ECHOINTERP:  - Left ventricle: The cavity size was normal. There was mild focal       basal hypertrophy of the septum. Systolic function was normal. The       estimated ejection fraction was in the range of 55% to 60%. Wall       motion was normal; there were no regional wall motion       abnormalities. Doppler parameters are consistent with a reversible       restrictive pattern, indicative of decreased left ventricular       diastolic compliance and/or increased left atrial pressure (grade       3 diastolic dysfunction).     - Aortic valve: Trivial regurgitation.     - Mitral valve: There was mild systolic anterior motion of the       chordal structures. Mild to moderate regurgitation.     - Left atrium: The atrium was moderately dilated.     - Right atrium: The atrium was mildly to moderately dilated.     - Pulmonary arteries: PA peak pressure: 32mm Hg (S). (07/13/2010 9:21)      Echocardiogram  Procedure date:  07/13/2010  Findings:       - Left ventricle: The cavity size was normal. There was mild focal       basal hypertrophy of the septum. Systolic function was normal. The       estimated ejection fraction was in the range of 55% to 60%. Wall       motion was normal; there were no regional wall motion       abnormalities. Doppler parameters are consistent with a reversible       restrictive pattern, indicative of decreased left ventricular       diastolic compliance and/or increased left atrial pressure (grade       3 diastolic dysfunction).     - Aortic valve: Trivial regurgitation.     - Mitral valve: There was mild systolic anterior motion of the       chordal structures. Mild to moderate regurgitation.     - Left atrium: The atrium was moderately dilated.     - Right atrium: The atrium was mildly to moderately dilated.     - Pulmonary arteries: PA peak pressure: 32mm Hg (S).

## 2010-10-30 ENCOUNTER — Encounter (INDEPENDENT_AMBULATORY_CARE_PROVIDER_SITE_OTHER): Payer: BC Managed Care – PPO

## 2010-10-30 ENCOUNTER — Encounter: Payer: Self-pay | Admitting: Cardiology

## 2010-10-30 ENCOUNTER — Encounter: Payer: Self-pay | Admitting: Internal Medicine

## 2010-10-30 DIAGNOSIS — I4891 Unspecified atrial fibrillation: Secondary | ICD-10-CM

## 2010-11-01 NOTE — Letter (Signed)
Summary: [employer], Main Office  [address]   [place], [place] [address]   Phone: [phone]  Fax: [phone]     October 22, 2010    [facility]   The above named patient had a medical visit today.  Please take this into consideration when reviewing the time away from work/school.      Sincerely yours, Genice Rouge, RN for Dr. Rollene Rotunda [facility]

## 2010-11-01 NOTE — Assessment & Plan Note (Signed)
Summary: f1y   Visit Type:  Follow-up Primary Provider:  Alyce Pagan, MD  CC:  Atrial fibrillation.  History of Present Illness: The patient presents for followup of atrial fibrillation. Since I last saw her she did have tachycardia palpitations particularly in the early and mid 2022/10/20. She was dealing with the stress of administering her mother's estate. Her mother died in the fall. Since late 10/20/22 her symptoms have predominantly abated. She has had no presyncope or syncope. She has had no chest discomfort, neck or arm discomfort. She has had no new shortness of breath, weight gain or edema. Of note I did stop her Coumadin at the last visit and she seemed to be maintaining sinus rhythm and was low risk for thromboembolic events.  Current Medications (verified): 1)  Benicar 40 Mg Tabs (Olmesartan Medoxomil) .... Take One Tablet By Mouth Daily 2)  Premarin 0.625 Mg Tabs (Estrogens Conjugated) .... One By Mouth Daily 3)  Tikosyn 250 Mcg Caps (Dofetilide) .Marland Kitchen.. 1 Capsule Two Times A Day  Allergies (verified): No Known Drug Allergies  Past History:  Past Medical History: Reviewed history from 04/10/2009 and no changes required. PAT (ICD-427.0) HYPERTENSION, BENIGN (ICD-401.1) Mild Anemia LVH mild Atrial Fibrillation  Past Surgical History: Reviewed history from 12/29/2008 and no changes required. Hysterectomy  Review of Systems       "Gas", belching.  Otherwise as stated in the HPI and negative for all other systems.  Vital Signs:  Patient profile:   60 year old female Height:      63 inches Weight:      157 pounds BMI:     27.91 Pulse rate:   70 / minute Resp:     16 per minute BP sitting:   162 / 98  (right arm)  Vitals Entered By: Marrion Coy, CNA (October 22, 2010 2:14 PM)  Physical Exam  General:  Well developed, well nourished, in no acute distress. Head:  normocephalic and atraumatic Eyes:  PERRLA/EOM intact; conjunctiva and lids normal. Mouth:  Upper  dentures, lower partial, gums and palate normal. Oral mucosa normal. Neck:  Neck supple, no JVD. No masses, thyromegaly or abnormal cervical nodes. Chest Wall:  no deformities or breast masses noted Lungs:  Clear bilaterally to auscultation and percussion. Abdomen:  Bowel sounds positive; abdomen soft and non-tender without masses, organomegaly, or hernias noted. No hepatosplenomegaly. Msk:  Back normal, normal gait. Muscle strength and tone normal. Extremities:  No clubbing or cyanosis. Neurologic:  Alert and oriented x 3. Skin:  Intact without lesions or rashes. Cervical Nodes:  no significant adenopathy Inguinal Nodes:  no significant adenopathy Psych:  Normal affect.   Detailed Cardiovascular Exam  Neck    Carotids: Carotids full and equal bilaterally without bruits.      Neck Veins: Normal, no JVD.    Heart    Inspection: no deformities or lifts noted.      Palpation: normal PMI with no thrills palpable.      Auscultation: irregular rate and rhythm, S1, S2 without murmurs, rubs, gallops, or clicks.    Vascular    Abdominal Aorta: no palpable masses, pulsations, or audible bruits.      Femoral Pulses: normal femoral pulses bilaterally.      Pedal Pulses: normal pedal pulses bilaterally.      Radial Pulses: normal radial pulses bilaterally.      Peripheral Circulation: no clubbing, cyanosis, or edema noted with normal capillary refill.     EKG  Procedure date:  10/22/2010  Findings:      atrial fibrillation, rate 96, axis within normal limits, intervals within normal limits, a lateral inversions unchanged from previous  Impression & Recommendations:  Problem # 1:  ATRIAL FIBRILLATION (ICD-427.31) The patient's initial EKG was sinus rhythm. However, while I listened to her she was irregular and I repeated an EKG which confirmed atrial fibrillation. Therefore, it is clear she is having paroxysms. The question is does this represent failure of Tikosyn or are these  palpitations very self limited.  She does report that she feels her heart rate now though she clearly isn't in distress or overly symptomatic and might not have mentioned it to me and I not heard it. Of note in the hospital she did not tolerate 500 micrograms dose as her QT prolonged.  Today I will place a 48-hour Holter to tryI judge how much fibrillation she has. It seems that she is failing this med, either with increased symptoms or presistent afib I will send her to see Dr. Johney Frame.  I reviewed her recent echo and still do not think that she is high risk for thromboembolism. Therefore, today I will not start Coumadin or Pradaxa. Orders: Holter (Holter)  Problem # 2:  HYPERTROPHIC CARDIOMYOPATHY (ICD-425.1) I will follow this up with echochardiograms.  Problem # 3:  HYPERTENSION, BENIGN (ICD-401.1) Her blood pressure is controlled and she will continue the meds as listed.  Other Orders: EKG w/ Interpretation (93000)  Patient Instructions: 1)  Your physician recommends that you schedule a follow-up appointment in: 3 months 2)  Your physician recommends that you continue on your current medications as directed. Please refer to the Current Medication list given to you today. 3)  Your physician has recommended that you wear a holter monitor.  Holter monitors are medical devices that record the heart's electrical activity. Doctors most often use these monitors to diagnose arrhythmias. Arrhythmias are problems with the speed or rhythm of the heartbeat. The monitor is a small, portable device. You can wear one while you do your normal daily activities. This is usually used to diagnose what is causing palpitations/syncope (passing out).

## 2010-11-13 NOTE — Procedures (Signed)
Summary: Summary Report  Summary Report   Imported By: Erle Crocker 11/07/2010 16:47:38  _____________________________________________________________________  External Attachment:    Type:   Image     Comment:   External Document

## 2010-12-02 LAB — BASIC METABOLIC PANEL
BUN: 10 mg/dL (ref 6–23)
BUN: 9 mg/dL (ref 6–23)
CO2: 27 mEq/L (ref 19–32)
CO2: 28 mEq/L (ref 19–32)
CO2: 29 mEq/L (ref 19–32)
Chloride: 104 mEq/L (ref 96–112)
Chloride: 105 mEq/L (ref 96–112)
Chloride: 106 mEq/L (ref 96–112)
Creatinine, Ser: 0.85 mg/dL (ref 0.4–1.2)
Creatinine, Ser: 0.89 mg/dL (ref 0.4–1.2)
GFR calc Af Amer: >60 mL/min (ref >60)
GFR calc Af Amer: >60 mL/min (ref >60)
GFR calc Af Amer: >60 mL/min (ref >60)
GFR calc non Af Amer: >60 mL/min (ref >60)
GFR calc non Af Amer: >60 mL/min (ref >60)
GFR calc non Af Amer: >60 mL/min (ref >60)
Glucose, Bld: 97 mg/dL (ref 70–99)
Glucose, Bld: 97 mg/dL (ref 70–99)
Glucose, Bld: 98 mg/dL (ref 70–99)
Potassium: 3.9 mEq/L (ref 3.5–5.1)
Potassium: 4 mEq/L (ref 3.5–5.1)
Potassium: 4 mEq/L (ref 3.5–5.1)
Sodium: 138 mEq/L (ref 135–145)
Sodium: 139 mEq/L (ref 135–145)

## 2010-12-02 LAB — MAGNESIUM
Magnesium: 1.9 mg/dL (ref 1.5–2.5)
Magnesium: 2 mg/dL (ref 1.5–2.5)

## 2010-12-02 LAB — PROTIME-INR
INR: 2.14 — ABNORMAL HIGH (ref 0.00–1.49)
INR: 2.43 — ABNORMAL HIGH (ref 0.00–1.49)
INR: 2.49 — ABNORMAL HIGH (ref 0.00–1.49)
Prothrombin Time: 23.3 seconds — ABNORMAL HIGH (ref 11.6–15.2)
Prothrombin Time: 26.7 seconds — ABNORMAL HIGH (ref 11.6–15.2)

## 2010-12-02 LAB — CBC
Hemoglobin: 11.6 g/dL — ABNORMAL LOW (ref 12.0–15.0)
RDW: 12.8 % (ref 11.5–15.5)

## 2010-12-02 LAB — APTT: aPTT: 32 seconds (ref 24–37)

## 2010-12-04 NOTE — Progress Notes (Signed)
Summary: Patients Event Diary  Patients Event Diary   Imported By: Roderic Ovens 11/22/2010 13:25:19  _____________________________________________________________________  External Attachment:    Type:   Image     Comment:   External Document

## 2010-12-17 LAB — APTT: aPTT: 33 seconds (ref 24–37)

## 2010-12-17 LAB — BASIC METABOLIC PANEL
Calcium: 8.8 mg/dL (ref 8.4–10.5)
GFR calc Af Amer: >60 mL/min (ref >60)
GFR calc non Af Amer: >60 mL/min (ref >60)
Glucose, Bld: 103 mg/dL — ABNORMAL HIGH (ref 70–99)
Potassium: 3.5 mEq/L (ref 3.5–5.1)
Sodium: 135 mEq/L (ref 135–145)

## 2010-12-17 LAB — CBC
HCT: 39.2 % (ref 36.0–46.0)
Hemoglobin: 12.9 g/dL (ref 12.0–15.0)
RDW: 13 % (ref 11.5–15.5)
WBC: 6.5 10*3/uL (ref 4.0–10.5)

## 2010-12-17 LAB — PROTIME-INR: INR: 2.14 — ABNORMAL HIGH (ref 0.00–1.49)

## 2010-12-19 LAB — PROTIME-INR: Prothrombin Time: 22.3 seconds — ABNORMAL HIGH (ref 11.6–15.2)

## 2010-12-21 ENCOUNTER — Other Ambulatory Visit: Payer: Self-pay | Admitting: *Deleted

## 2010-12-21 MED ORDER — OLMESARTAN MEDOXOMIL 40 MG PO TABS: 40.0000 mg | ORAL_TABLET | Freq: Every day | ORAL | Status: DC

## 2010-12-21 MED ORDER — DOFETILIDE 250 MCG PO CAPS: 250.0000 ug | ORAL_CAPSULE | Freq: Two times a day (BID) | ORAL | Status: DC

## 2011-01-08 ENCOUNTER — Telehealth: Payer: Self-pay | Admitting: Cardiology

## 2011-01-21 ENCOUNTER — Telehealth: Payer: Self-pay | Admitting: Cardiology

## 2011-01-21 NOTE — Telephone Encounter (Signed)
Pt needs benicar 40mg  to be called or fax to [facility] FAx# [phone]. Pt wants to be called when rs is called in or fax in.

## 2011-01-22 ENCOUNTER — Other Ambulatory Visit: Payer: Self-pay | Admitting: *Deleted

## 2011-01-22 MED ORDER — OLMESARTAN MEDOXOMIL 40 MG PO TABS: 40.0000 mg | ORAL_TABLET | Freq: Every day | ORAL | Status: DC

## 2011-01-29 NOTE — Assessment & Plan Note (Signed)
[facility]                            CARDIOLOGY OFFICE NOTE   [name], [name]                   MRN:          [id number]  DATE:06/16/2008                            DOB:          04-Apr-1951    PRIMARY CARE PHYSICIAN:  None.   REASON FOR PRESENTATION:  Evaluate the patient with palpitations.   HISTORY OF PRESENT ILLNESS:  This patient was admitted overnight from  May 22, 2008, to May 23, 2008.  She had some dyspnea and  palpitations.  She ruled out for myocardial infarction.  She did have a  markedly abnormal EKG, felt to be probably repolarization.  Since the  hospitalization, she has had occasional palpitations.  She has been  wearing a monitor and does demonstrate premature atrial contractions and  occasional 4 or 5 beats of atrial tachycardia.  These have not been  particularly bothersome.  She has had no presyncope or syncope.  She is  still drinking a cup of coffee a day.   She also had an echocardiogram, which confirms some mild-to-moderate  left ventricular hypertrophy with some mild basilar septal hypertrophy.  She otherwise had well-preserved ejection fraction and no significant  valvular abnormalities.  The patient did have her Benicar HCT increased  in the hospital.  She has done well with this.  She had follow up labs  demonstrating normal renal function and potassium.   The patient currently is having no chest, neck or arm discomfort.  She  has had no shortness of breath, PND or orthopnea.  She is walking for  exercise.   PAST MEDICAL HISTORY:  Hypertension, hysterectomy.   ALLERGIES:  None.   MEDICATIONS:  1. Benicar HCT 40/12.5 daily.  2. Potassium 20 mEq daily.  3. Premarin.   REVIEW OF SYSTEMS:  As stated in the HPI and otherwise negative for  other systems.   PHYSICAL EXAMINATION:  GENERAL:  The patient is in no distress.  VITAL SIGNS:  Blood pressure 130/78, heart rate 64 and regular, weight  176  pounds, body mass index 31.  HEENT:  Eyes are unremarkable.  Pupils, equal, round, and reactive to  light.  Fundi, not visualized.  Oral mucosa unremarkable.  NECK:  No jugular vein distention at 45 degrees.  Carotid upstroke brisk  and symmetrical.  No bruits, no thyromegaly.  LYMPHATICS:  No cervical, axillary, or inguinal adenopathy.  LUNGS:  Clear to auscultation bilaterally.  BACK:  No costovertebral angle tenderness.  CHEST:  Unremarkable.  HEART:  PMI not displaced or sustained.  S1 and S2 within normal limits.  No S3, no S4, no clicks, no rubs, and no murmurs.  ABDOMEN:  Flat, positive bowel sounds, normal in frequency and pitch, no  bruits, no rebound, no guarding or midline pulsatile mass.  No  hepatomegaly, no splenomegaly.  SKIN:  No rashes, no nodules.  EXTREMITIES:  2+ pulses, no edema.   EKG sinus rhythm, rate 63, axis within normal limits, intervals within  normal limits, lateral anterior T-wave inversions consistent with  repolarization.   ASSESSMENT AND PLAN:  1. Hypertension.  Blood pressure is now well controlled.  She takes it      at home and has excellent blood pressures.  At this point, she will      continue with the medications as listed.  No further workup is      planned.  2. Palpitations.  These are not particularly symptomatic.  I told her      if they do bother her, she should first cut out the caffeine      completely.  If they continue to be problematic, then we could      consider further management.  3. Anemia.  The patient had a mild anemia in the hospital.  She is to      find a primary care doctor.  She has had no evidence of bleeding.  4. Followup.  I will see back in 6 months to make sure she has found a      primary care doctor and things are being followed appropriately.      She can come and see me sooner if she has any complaints.     Rollene Rotunda, MD, [facility]  Electronically Signed    JH/MedQ  DD: 06/16/2008  DT: 06/16/2008  Job #:  [id number]

## 2011-01-29 NOTE — Assessment & Plan Note (Signed)
[facility]                            CARDIOLOGY OFFICE NOTE   DELITHA, ELMS                   MRN:          [id number]  DATE:10/12/2008                            DOB:          08-22-1951    PRIMARY CARDIOLOGIST:  Rollene Rotunda, MD, [facility]   [name] is a 60 year old African American female patient with  history of hypertension and palpitations on which she wore an event  recorder in September 2009.  This showed some premature atrial  contractions and occasional 45 beats of atrial tachycardia.  She also  had an echo at that time that showed mild to moderate LVH with mild  basilar septal hypertrophy with well preserved ejection fraction.   The patient is here today complaining of increase in palpitations in  December and January.  In December, she related that to eating too much  pork.  She says when she eats a lot of salt, she feels like she has more  trouble.  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  She says that her heart starts  beating real fast and she becomes a little bit dizzy for a few seconds  and then it goes away quickly.  She has not had any syncope.  She said  her worst episode when she became dizzy with shortly after eating a [employer]  pizza and drinking a Pepsi.  She drinks one Pepsi a day and also drinks  caffeinated green tea almost daily.  She does not drink any coffee.  She  exercises every morning, she has turned on a fitness channel and does an  aerobic workout and says she has no palpitations or problems during her  exercise, routine.   CURRENT MEDICATIONS:  1. Benicar.  2. Hydrochlorothiazide 40/12.5 mg daily.  3. Potassium 20 mEq daily.  4. Premarin 0.625 mg daily.   PHYSICAL EXAMINATION:  GENERAL:  This is a pleasant 60 year old African  American female in no acute distress.  VITAL SIGNS:  Blood pressure 126/74, pulse 64, and weight 174.  NECK:   Without JVD, HJR, bruit, or thyroid enlargement.  LUNGS:  Clear anterior, posterior, and lateral.  HEART:  Regular rate and rhythm at 64 beats per minute.  Normal S1-S2.  Positive S4 with a 2/6 systolic murmur at the apex and left sternal  border.  ABDOMEN:  Soft without organomegaly, masses, lesions, or abnormal  tenderness.  EXTREMITIES:  Without cyanosis, clubbing, or edema.  She has good distal  pulses.   EKG, normal sinus rhythm with moderate LVH, marked ST-T wave changes  with T-wave inversion in inferior lateral.  These are unchanged from  prior tracings.   IMPRESSION:  1. Recurrent palpitations with history of atrial contractions and      atrial tachycardia on event recorder in September.  2. Hypertension with left ventricular hypertrophy.  3. Mild to moderate left ventricular hypertrophy with some mild      basilar septal hypertrophy on echo in September 2009.  4. Prior history of anemia.   PLAN:  I have  asked the patient to stop drinking caffeine and to see if  this does not completely resolve her problems, we will check a TSH today  if these measures do not help, I asked her to call us back and she may  need some medications.  She will see Dr. Antoine Poche back in follow up in 1-  2 months or sooner if needed.      Jacolyn Reedy, PA-C  Electronically Signed      Luis Abed, MD, [facility]  Electronically Signed   ML/MedQ  DD: 10/12/2008  DT: 10/13/2008  Job #: [id number]

## 2011-01-29 NOTE — H&P (Signed)
[name], [name] NO.:  [id number]   MEDICAL RECORD NO.:  [id number]          PATIENT TYPE:  EMS   LOCATION:  ED                           [facility]ACILITY[facility]  [facility]   PHYSICIAN:  Rollene Rotunda, MD, FACCDATE OF BIRTH:  06-Oct-1950   DATE OF ADMISSION:  05/22/2008  DATE OF DISCHARGE:                              HISTORY & PHYSICAL   PRIMARY CARE PHYSICIAN:  None.   CARDIOLOGIST:  None.   REASON FOR PRESENTATION:  Palpitations.   HISTORY OF PRESENT ILLNESS:  The patient is a very pleasant 60 year old  African-American female who reports that about 10 years ago she a cath  by our group.  She said she had some extra muscle.  There were  apparently no blockages.  I do not have any of these records.  She did  have to see a cardiologist after this.  She has had high blood pressure  that she thinks has not been particularly well controlled.   About 2 weeks ago, she had palpitations at work.  She said it was hot  outside.  She was coming in.  She felt a rapid heart rate.  She felt  lightheaded.  This lasted for a few minutes.  She got a drink.  Things  passed spontaneously.  She did not have any syncopal episode.  She did  not have any chest discomfort or shortness of breath.  She did not seek  any help.  She had a second episode yesterday.  She was in the bathtub.  She felt her heart racing.  She felt a flutter a little bit.  The whole  episode lasted about 5 minutes.  She was lightheaded getting out of the  bathtub, but did not have any syncope.  It went away spontaneously.  She  had no chest discomfort.  She had no shortness of breath.   She takes care of her mom, whom she has to bathe and care for.  She  vacuums.  She rarely exercises.  She says that with this she gets no  chest pressure, neck or arm discomfort.  She has no shortness of breath,  no PND or orthopnea.  She does belch a lot, but this has been a chronic  problem.   The patient's daughter came today and  insisted that she come to the ER.  In the ER, she has had no complaints.  Her initial blood pressure was  quite elevated at 203/112, but improved to 163/91.  She does have an  abnormal EKG with deep T wave inversions in the lateral leads.  I do not  have any old EKGs for comparison.  Initial point of care markers have  been negative.  Her chest x-ray does suggestion cardiomegaly.  We are  consulted because of the abnormal EKG predominantly.   PAST MEDICAL HISTORY:  Hypertension; she thinks it has been going on for  about 2-3 years.   PAST SURGICAL HISTORY:  Hysterectomy.   ALLERGIES:  None.   MEDICATIONS:  1. Benicar 20 daily.  2. Premarin 0.625 mg daily.   SOCIAL HISTORY:  The patient smoked 2 packs  per day for 20 years,  quitting 10 years ago.  She lives at home with her mom.  She is a  Occupational psychologist.   FAMILY HISTORY:  Noncontributory for early coronary disease, though her  father has heart failure for years.   REVIEW OF SYSTEMS:  As stated in the HPI and positive for stress at  work.  Negative for all other systems.   PHYSICAL EXAMINATION:  GENERAL:  The patient is in no distress.  VITAL SIGNS:  Blood pressure 162/91, heart rate 60 and regular,  respiratory rate 18, afebrile.  HEENT:  Eyelids are unremarkable.  Pupils equal, round, reactive to  light.  Fundi not visualized.  Oral mucosa unremarkable.  NECK:  No jugular venous distention at 45 degrees.  Carotid upstroke  brisk and symmetric.  No bruits, no thyromegaly.  LYMPHATICS:  No cervical, axillary or inguinal adenopathy.  LUNGS:  Clear to auscultation bilaterally.  BACK:  No costovertebral tenderness.  CHEST:  Unremarkable.  HEART:  PMI not displaced or sustained.  S1 and S2 within normal limits.  No S3, no S4, no clicks, no rubs, no murmurs.  ABDOMEN:  Flat.  Positive bowel sounds normal in frequency and pitch.  No bruits, no rebound, no guarding, no midline pulsatile mass.  No  hepatomegaly,  no splenomegaly.  SKIN:  No rashes, no nodules.  EXTREMITIES:  2+ pulses throughout.  No edema, no cyanosis, no clubbing.  NEUROLOGIC:  Oriented to person, place and time.  Cranial nerves II-XII  grossly intact.  Motor grossly intact.   ELECTROCARDIOGRAM:  As above.   LABORATORY DATA:  Sodium 141, potassium 3.5, BUN 11, creatinine 0.9.  Point care markers negative x1.  WBCs 5.8, platelets 249, hemoglobin  11.3.   CHEST X-RAY:  Cardiomegaly, probable COPD.   ASSESSMENT/PLAN:  1. Palpitations.  The patient has had some palpitations but no      associated presyncope or syncope.  She has had some      lightheadedness.  At this point, will check a TSH.  Will monitor on      telemetry overnight.  She should have an outpatient event monitor      in our office.  2. Abnormal EKG.  I suspect this more likely LVH with repolarization.      She does not have an old EKG for comparison.  Will put her in and      rule out myocardial infarction.  Will check for any old EKGs and      repeat an EKG in the morning.  If her enzymes are normal and her      EKG is unchanged, then no further cardiac workup inpatient is      required.  She should get an outpatient echocardiogram.  3. Hypertension.  I suspect her blood pressure is not well controlled.      I will increase her Benicar to 40 mg and add hydrochlorothiazide      12.5.  This can be as a single pill.  Because her potassium is      slightly low, I will give her 40 mEq a day.  She should go home      with 20 mEq per day.  Will follow the BMET in the office.  4. Anemia.  The patient is slightly anemic.  Will check a stool      guaiac.  This can be followed up as an outpatient.     Rollene Rotunda, MD, [facility]  Electronically Signed  JH/MEDQ  D:  05/22/2008  T:  05/22/2008  Job:  [id number]

## 2011-01-29 NOTE — Discharge Summary (Signed)
[name], [name] NO.:  [id number]   MEDICAL RECORD NO.:  [id number]          PATIENT TYPE:  OBV   LOCATION:  1442                         FACILITY:  [facility]   PHYSICIAN:  Jonelle Sidle, MD DATE OF BIRTH:  Jan 06, 1951   DATE OF ADMISSION:  05/22/2008  DATE OF DISCHARGE:  05/23/2008                               DISCHARGE SUMMARY   ADMITTING PHYSICIAN:  Rollene Rotunda, M.D.   DISCHARGING PHYSICIAN:  Jonelle Sidle, M.D.   FINAL DIAGNOSIS:  Palpitations with documentation of occasional  premature atrial complexes, but no sustained arrhythmia's.   SECONDARY DIAGNOSES:  1. Hypertension, not optimally controlled.  2. Hypokalemia.  3. Abnormal electrocardiogram suggestive of left ventricular      hypertrophy with repolarization abnormalities.  4. Mild anemia, not otherwise specified.   BRIEF HISTORY OF PRESENT ILLNESS:  [name] is a 60 year old woman  with a history of hypertension and previous tobacco abuse who presented  to the [facility] emergency department with a complaint of  intermittent palpitations in the absence of any specific chest pain,  dizziness or syncope.  Please see the dictated history and physical by  Dr. Antoine Poche for full details of presentation.   HOSPITAL COURSE:  [name] was admitted for observation on telemetry.  She had occasional premature atrial complexes noted without any  significant sustained arrhythmia's or pauses.  Overall, she was not  significantly symptomatic during her observation.  She ruled out for  myocardial infarction with serial cardiac markers and experienced no  chest pain.  Her electrocardiogram demonstrates changes consistent with  left ventricular hypertrophy and repolarization abnormalities,  specifically lateral ST segment depression with T wave inversions (not  dynamic).   Medical therapy was adjusted, specifically Benicar was increased to  Benicar/HCT 40/12.5 mg daily and a  potassium supplement was added.  The  patient's potassium increased from 3.5 to 3.6 and she was noted to have  normal renal function with a BUN and creatinine of 8 and 0.7 as well as  a normal sodium of 138.  Her peak troponin-I level was 0.04 and her TSH  was normal at 1.4.  the patient's chest x-ray demonstrated mild  cardiomegaly without any acute process.  There was some hyperinflation  suggestive of potential chronic obstructive pulmonary disease.  The  patient was also noted to be mildly anemic with a hemoglobin and  hematocrit of 11.3 and 35.5 respectively.  MCV was 78.5.  Stool guaiac  was ordered, although not completed prior to discharge as the patient  did not have a bowel movement.  On the day of discharge she was  ambulating and reported no major symptomatology.   DISPOSITION:  [name] is being discharged home on the May 23, 2008.  She was given prescriptions for the new medication adjustments  outlined below.  A message was left with our office regarding scheduling  of further outpatient studies and appropriate followup.  The patient  will be scheduled for a BMET in approximately 2 weeks, an  echocardiogram, and placement of an event recorder for further  evaluation of her palpitations.  These  results are to be forwarded to  Dr. Antoine Poche and a formal visit with him is to be scheduled over the  next three weeks.  No specific limitations to activity were described  other than gradually increasing to baseline.   DISCHARGE MEDICATIONS:  1. Benicar/HCT 40/12.5 mg p.o. daily.  2. Potassium chloride 20 mEq p.o. daily.  3. Premarin 0.25 mg p.o. daily (taking previously).      Jonelle Sidle, MD  Electronically Signed     SGM/MEDQ  D:  05/23/2008  T:  05/23/2008  Job:  [id number]   cc:   Rollene Rotunda, MD, [facility]  [address]  [address]  [place]  [place] [address]

## 2011-02-22 ENCOUNTER — Other Ambulatory Visit (HOSPITAL_COMMUNITY): Payer: Self-pay | Admitting: Family Medicine

## 2011-02-22 DIAGNOSIS — Z1231 Encounter for screening mammogram for malignant neoplasm of breast: Secondary | ICD-10-CM

## 2011-04-02 ENCOUNTER — Encounter: Payer: Self-pay | Admitting: Family Medicine

## 2011-04-03 ENCOUNTER — Other Ambulatory Visit (HOSPITAL_COMMUNITY): Payer: Self-pay | Admitting: Family Medicine

## 2011-04-03 DIAGNOSIS — Z1231 Encounter for screening mammogram for malignant neoplasm of breast: Secondary | ICD-10-CM

## 2011-04-04 ENCOUNTER — Ambulatory Visit (HOSPITAL_COMMUNITY)
Admission: RE | Admit: 2011-04-04 | Discharge: 2011-04-04 | Disposition: A | Discharge status: Home / Self Care | Payer: Self-pay | Source: Ambulatory Visit | Attending: Family Medicine | Admitting: Family Medicine

## 2011-04-04 DIAGNOSIS — Z1231 Encounter for screening mammogram for malignant neoplasm of breast: Secondary | ICD-10-CM

## 2011-04-22 ENCOUNTER — Telehealth: Payer: Self-pay | Admitting: Cardiology

## 2011-04-22 MED ORDER — OLMESARTAN MEDOXOMIL 40 MG PO TABS: 40.0000 mg | ORAL_TABLET | Freq: Every day | ORAL | Status: DC

## 2011-04-22 NOTE — Telephone Encounter (Signed)
Pt calling back and wants to change pharmacy to [facility]

## 2011-04-22 NOTE — Telephone Encounter (Signed)
Per pt call, pt needing refill of pt BP medicine, benicar 40 mg. Pt no longer has insurance and would now like RX called into [facility] on [address]. [phone]

## 2011-04-23 ENCOUNTER — Other Ambulatory Visit: Payer: Self-pay | Admitting: Cardiology

## 2011-04-23 MED ORDER — OLMESARTAN MEDOXOMIL 40 MG PO TABS: 40.0000 mg | ORAL_TABLET | Freq: Every day | ORAL | Status: DC

## 2011-04-23 NOTE — Telephone Encounter (Signed)
Pt is going out of town tomorrow for 2 weeks,  pt has 20 pills now, but when pt comes back into town she wants to make sure pt RX refill has been called in

## 2011-04-30 ENCOUNTER — Telehealth: Payer: Self-pay | Admitting: Cardiology

## 2011-04-30 DIAGNOSIS — I5022 Chronic systolic (congestive) heart failure: Secondary | ICD-10-CM

## 2011-04-30 DIAGNOSIS — I10 Essential (primary) hypertension: Secondary | ICD-10-CM

## 2011-04-30 MED ORDER — LOSARTAN POTASSIUM 50 MG PO TABS: 50.0000 mg | ORAL_TABLET | Freq: Every day | ORAL | Status: DC

## 2011-04-30 NOTE — Telephone Encounter (Signed)
RX send into pharmacy as requested.

## 2011-04-30 NOTE — Telephone Encounter (Signed)
Will review with Dr Antoine Poche and call pt with any orders.

## 2011-04-30 NOTE — Telephone Encounter (Signed)
Pt need a gentric plan for her medication - benicar   due to lay off from job.

## 2011-04-30 NOTE — Telephone Encounter (Signed)
OK to switch to 50 mg Cozaar (one po daily, disp 90 with 3 refills).

## 2011-06-19 LAB — POCT CARDIAC MARKERS
CKMB, poc: 1.4
Myoglobin, poc: 30
Troponin i, poc: <0.05

## 2011-06-19 LAB — POCT I-STAT, CHEM 8
BUN: 11
Calcium, Ion: 1.21
Chloride: 105
Creatinine, Ser: 0.9
Glucose, Bld: 93
HCT: 36
Hemoglobin: 12.2
Potassium: 3.5
Sodium: 141
TCO2: 26

## 2011-06-19 LAB — TSH: TSH: 1.438

## 2011-06-19 LAB — DIFFERENTIAL
Basophils Absolute: 0
Basophils Relative: 1
Eosinophils Absolute: 0.1
Eosinophils Relative: 2
Lymphocytes Relative: 24
Lymphs Abs: 1.4
Monocytes Absolute: 0.3
Monocytes Relative: 5
Neutro Abs: 3.9
Neutrophils Relative %: 68

## 2011-06-19 LAB — BASIC METABOLIC PANEL
CO2: 28
Calcium: 8.9
Chloride: 105
GFR calc Af Amer: >60
Glucose, Bld: 96
Potassium: 3.6
Sodium: 138

## 2011-06-19 LAB — T4, FREE: Free T4: 1.17

## 2011-06-19 LAB — CBC
HCT: 35.5 — ABNORMAL LOW
Hemoglobin: 11.3 — ABNORMAL LOW
MCHC: 31.9
MCV: 78.5
Platelets: 249
RBC: 4.52
RDW: 13.2
WBC: 5.8

## 2011-06-19 LAB — CK TOTAL AND CKMB (NOT AT ARMC): Relative Index: 1.6

## 2011-06-19 LAB — TROPONIN I: Troponin I: 0.04

## 2011-07-15 ENCOUNTER — Telehealth: Payer: Self-pay | Admitting: Cardiology

## 2011-07-15 NOTE — Telephone Encounter (Signed)
She is calling about tikosyn and this med is too expensive for her to get. Please call her back

## 2011-07-15 NOTE — Telephone Encounter (Signed)
Pt is not working d/t a lay off and has no insurance coverage for medications.  Will look into patient assistance program.

## 2011-07-16 NOTE — Telephone Encounter (Signed)
Forms and samples placed at the front desk.

## 2011-07-16 NOTE — Telephone Encounter (Signed)
Pt returning calling stating that she takes tikosyn 0.25 mg -- one capsule 2x/day. Pt said she will come up to the office tomorrow to sign forms for the patient assistance program bringing with her verification of income.

## 2011-07-16 NOTE — Telephone Encounter (Signed)
Called pt to verify dosage of medication.  Had #28 Tikosyn 250 mcg [id number] May 13.  Pt needs to bring verification of income and sign forms for the patient assistance program.

## 2011-07-16 NOTE — Telephone Encounter (Signed)
Pt rtn your call

## 2011-07-19 NOTE — Telephone Encounter (Signed)
Patient calling back regarding Tikoysn Rx.

## 2011-07-19 NOTE — Telephone Encounter (Signed)
All forms signed.  Pt picked up samples.  Paperwork mailed into [employer]

## 2011-08-09 ENCOUNTER — Inpatient Hospital Stay (HOSPITAL_COMMUNITY)
Admission: EM | Admit: 2011-08-09 | Discharge: 2011-08-10 | DRG: 310 | Disposition: A | Discharge status: Home / Self Care | Payer: Self-pay | Attending: Internal Medicine | Admitting: Internal Medicine

## 2011-08-09 ENCOUNTER — Encounter (HOSPITAL_COMMUNITY): Payer: Self-pay | Admitting: Emergency Medicine

## 2011-08-09 ENCOUNTER — Emergency Department (HOSPITAL_COMMUNITY): Payer: Self-pay

## 2011-08-09 ENCOUNTER — Other Ambulatory Visit: Payer: Self-pay

## 2011-08-09 ENCOUNTER — Encounter: Payer: Self-pay | Admitting: Cardiovascular Disease

## 2011-08-09 DIAGNOSIS — R9431 Abnormal electrocardiogram [ECG] [EKG]: Secondary | ICD-10-CM

## 2011-08-09 DIAGNOSIS — I471 Supraventricular tachycardia, unspecified: Secondary | ICD-10-CM | POA: Insufficient documentation

## 2011-08-09 DIAGNOSIS — I340 Nonrheumatic mitral (valve) insufficiency: Secondary | ICD-10-CM

## 2011-08-09 DIAGNOSIS — E669 Obesity, unspecified: Secondary | ICD-10-CM | POA: Diagnosis present

## 2011-08-09 DIAGNOSIS — I4581 Long QT syndrome: Secondary | ICD-10-CM | POA: Diagnosis present

## 2011-08-09 DIAGNOSIS — I4891 Unspecified atrial fibrillation: Principal | ICD-10-CM | POA: Diagnosis present

## 2011-08-09 DIAGNOSIS — I059 Rheumatic mitral valve disease, unspecified: Secondary | ICD-10-CM | POA: Diagnosis present

## 2011-08-09 DIAGNOSIS — J329 Chronic sinusitis, unspecified: Secondary | ICD-10-CM | POA: Diagnosis present

## 2011-08-09 DIAGNOSIS — I10 Essential (primary) hypertension: Secondary | ICD-10-CM | POA: Diagnosis present

## 2011-08-09 DIAGNOSIS — J069 Acute upper respiratory infection, unspecified: Secondary | ICD-10-CM

## 2011-08-09 LAB — POCT I-STAT, CHEM 8
BUN: 15 mg/dL (ref 6–23)
Calcium, Ion: 1.2 mmol/L (ref 1.12–1.32)
Chloride: 105 mEq/L (ref 96–112)
Creatinine, Ser: 0.9 mg/dL (ref 0.50–1.10)

## 2011-08-09 LAB — CBC
HCT: 37.9 % (ref 36.0–46.0)
Hemoglobin: 12.3 g/dL (ref 12.0–15.0)
MCH: 25.1 pg — ABNORMAL LOW (ref 26.0–34.0)
MCV: 77.2 fL — ABNORMAL LOW (ref 78.0–100.0)
RBC: 4.91 MIL/uL (ref 3.87–5.11)

## 2011-08-09 LAB — TROPONIN I: Troponin I: <0.3 ng/mL (ref <0.30)

## 2011-08-09 MED ORDER — DOFETILIDE 250 MCG PO CAPS
250.0000 ug | ORAL_CAPSULE | Freq: Two times a day (BID) | ORAL | Status: DC
  Administered 2011-08-09 – 2011-08-10 (×2): 250 ug via ORAL
  Filled 2011-08-09 (×3): qty 1

## 2011-08-09 MED ORDER — ASPIRIN EC 325 MG PO TBEC
325.0000 mg | DELAYED_RELEASE_TABLET | Freq: Every day | ORAL | Status: DC
  Administered 2011-08-10: 325 mg via ORAL
  Filled 2011-08-09: qty 1

## 2011-08-09 MED ORDER — SODIUM CHLORIDE 0.9 % IV SOLN: 250.0000 mL | INTRAVENOUS | Status: DC

## 2011-08-09 MED ORDER — ENOXAPARIN SODIUM 40 MG/0.4ML ~~LOC~~ SOLN
40.0000 mg | SUBCUTANEOUS | Status: DC
  Filled 2011-08-09: qty 0.4

## 2011-08-09 MED ORDER — LOSARTAN POTASSIUM 50 MG PO TABS
50.0000 mg | ORAL_TABLET | Freq: Every day | ORAL | Status: DC
  Administered 2011-08-10: 50 mg via ORAL
  Filled 2011-08-09: qty 1

## 2011-08-09 MED ORDER — SODIUM CHLORIDE 0.9 % IJ SOLN: 3.0000 mL | INTRAMUSCULAR | Status: DC | PRN

## 2011-08-09 MED ORDER — ONDANSETRON HCL 4 MG/2ML IJ SOLN: 4.0000 mg | Freq: Four times a day (QID) | INTRAMUSCULAR | Status: DC | PRN

## 2011-08-09 MED ORDER — NITROGLYCERIN 0.4 MG SL SUBL: 0.4000 mg | SUBLINGUAL_TABLET | SUBLINGUAL | Status: DC | PRN

## 2011-08-09 MED ORDER — METOPROLOL TARTRATE 1 MG/ML IV SOLN
2.5000 mg | Freq: Once | INTRAVENOUS | Status: AC
  Administered 2011-08-09: 2.5 mg via INTRAVENOUS
  Filled 2011-08-09: qty 5

## 2011-08-09 MED ORDER — METOPROLOL TARTRATE 25 MG PO TABS
25.0000 mg | ORAL_TABLET | Freq: Four times a day (QID) | ORAL | Status: DC
  Administered 2011-08-09: 25 mg via ORAL
  Filled 2011-08-09 (×5): qty 1

## 2011-08-09 MED ORDER — ACETAMINOPHEN 325 MG PO TABS: 650.0000 mg | ORAL_TABLET | ORAL | Status: DC | PRN

## 2011-08-09 MED ORDER — AMOXICILLIN-POT CLAVULANATE 875-125 MG PO TABS
1.0000 | ORAL_TABLET | Freq: Two times a day (BID) | ORAL | Status: DC
  Administered 2011-08-09 – 2011-08-10 (×2): 1 via ORAL
  Filled 2011-08-09 (×3): qty 1

## 2011-08-09 NOTE — ED Notes (Signed)
Continues to rest quietly in bed. Denies pain. Resp even and unlabored. Awaiting admission.

## 2011-08-09 NOTE — ED Notes (Signed)
Pt went to urgent care this am for cold symptoms. While there she stated she felt like her heart began racing. Urgent care staff placed on heart monitor. Reports pt HR 170-180 for a few seconds. EMS reports pt had episode of SVT in route lasting a few seconds. Pt states she feels anxious and SOB during episodes. Denies pain or SOB at this time.

## 2011-08-09 NOTE — H&P (Signed)
Patient ID: [name] MRN: [id number], DOB/AGE: [birth year]   Admit date: 08/09/2011 Date of Consult: @TODAY @  Primary Physician: Leo Grosser, MD, MD Primary Cardiologist:Hochrein  HPI Patient is a 78 with a history of atrial fibrillation (PAF, on Tikosyn), HTN.  She went to urgent care today  for URI eval.  She had a sinus infection about one month ago.  Got better.  About 1 wk ago got congested again (nose, cough).  Now blowing out yellow sputum.  Denies fevers.  Breathing limited by stuffed nose.  Denies use of OTC meds. While at the urgent care she said she felt her heart racing.  Monitor showed increased HR and she was transferred to [facility] ER. No chest pain.  No syncope. Had holter last spring that showed SR with bursts of afib/flutter. Echo last spring showed mild LVH.  SAM of mitral chordae.  Mild to moderate MR> Problem List: Past Medical History  Diagnosis Date  . Hypertension   . Obesity   . Atrial fibrillation     History reviewed. No pertinent past surgical history.   Allergies: No Known Allergies  Home meds:  Tikosyn, Cozaar 50 qd.   Inpatient Medications:    . metoprolol  2.5 mg Intravenous Once    No family history on file.   History   Social History  . Marital Status: Divorced    Spouse Name: N/A    Number of Children: N/A  . Years of Education: N/A   Occupational History  . Not on file.   Social History Main Topics  . Smoking status: Never Smoker   . Smokeless tobacco: Not on file  . Alcohol Use: No  . Drug Use: No  . Sexually Active:    Other Topics Concern  . Not on file   Social History Narrative  . No narrative on file     Review of Systems: General: negative for fever, night sweats or weight changes. Cardiovascular: negative for chest pain, dyspnea on exertion, edema, orthopnea,  paroxysmal nocturnal dyspnea  Dermatological: negative for rash Respiratory: AS noted above. Urologic: negative for  hematuria Abdominal: negative for nausea, vomiting, diarrhea, bright red blood per rectum, melena, or hematemesis Neurologic: negative for visual changes, syncope, or dizziness All other systems reviewed and are otherwise negative except as noted above.  Physical Exam: Filed Vitals:   08/09/11 1659  BP: 156/98  Pulse: 94  Resp: 20   No intake or output data in the 24 hours ending 08/09/11 1745  General: Well developed, well nourished, in no acute distress.  She denies CP.  No palpitations. Head: Normocephalic, atraumatic, sclera non-icteric, no xanthomas, nares are without discharge.  Neck: Negative for carotid bruits. JVD not elevated. Lungs: Clear bilaterally to auscultation without wheezes, rales, or rhonchi. Breathing is unlabored. Heart: RRR with S1 S2. No murmurs, rubs, or gallops appreciated. Abdomen: Soft, non-tender, non-distended with normoactive bowel sounds. No hepatomegaly. No rebound/guarding. No obvious abdominal masses. Msk:  Strength and tone appears normal for age. Extremities: No clubbing, cyanosis or edema.  Distal pedal pulses are 2+ and equal bilaterally. Neuro: Alert and oriented X 3. Moves all extremities spontaneously. Psych:  Responds to questions appropriately with a normal affect.  Labs: No results found for this or any previous visit (from the past 24 hour(s)).  Radiology/Studies: No results found.  EKG:  ST  100.  LVH with strain pattern.  QTc 485 msec  ASSESSMENT AND PLAN: Active Problems:  HYPERTENSION, BENIGN  BP is a little high.  Will add b blocker for ectopy.  Follow.  COntinue cozaar.  HYPERTROPHIC CARDIOMYOPATHY  Echo shows only mild focal basal hypertrophy on report.  Not definite HCM.  SVT/ PSVT/ PAT  Atrial fibrillation  Pateint with bursts of afib/flutter.  B Blockade has been initiated in ER.  Plan to admit to telemetry.   I would continue b blockade q 6 hrs.  Continue tikosyn.  Check EKG in AM.  Check TSH.  Avoid stimulants.   Mitral regurgitation  MIld to moderate by echo in the spring.  URI (upper respiratory infection)  Patient does not appear to be septic.  Probable sinus infection.  Will discuss with pharmacy and ABX choice given current meds.      Scherrie Merritts 08/09/2011, 5:45 PM

## 2011-08-09 NOTE — ED Provider Notes (Signed)
History     CSN: [id number] Arrival date & time: 08/09/2011  4:46 PM   First MD Initiated Contact with Patient 08/09/11 1657      Chief Complaint  Patient presents with  . Atrial Fibrillation    (Consider location/radiation/quality/duration/timing/severity/associated sxs/prior treatment) Patient is a 60 y.o. female presenting with atrial fibrillation. The history is provided by the patient.  Atrial Fibrillation This is a recurrent problem. The current episode started 12 to 24 hours ago. Associated symptoms include shortness of breath. Pertinent negatives include no chest pain, no abdominal pain and no headaches.   patient has had a cough and upper respiratory symptoms. Mild shortness of breath and nasal congestion. She was seen in urgent care in her heart rate began racing. She does have a history of atrial fibrillation. There is no chest pain with the episodes. Will feel a little short of breath when her heart going fast. She's not on Coumadin currently. She has not been taking any stimulant cough medicines. No lightheadedness or dizziness. No fevers.  Past Medical History  Diagnosis Date  . Hypertension   . Obesity   . Atrial fibrillation     History reviewed. No pertinent past surgical history.  No family history on file.  History  Substance Use Topics  . Smoking status: Never Smoker   . Smokeless tobacco: Not on file  . Alcohol Use: No    OB History    Grav Para Term Preterm Abortions TAB SAB Ect Mult Living                  Review of Systems  Constitutional: Negative for activity change and appetite change.  HENT: Positive for congestion. Negative for neck stiffness.   Eyes: Negative for pain.  Respiratory: Positive for cough and shortness of breath. Negative for chest tightness.   Cardiovascular: Positive for palpitations. Negative for chest pain and leg swelling.  Gastrointestinal: Negative for nausea, vomiting, abdominal pain and diarrhea.  Genitourinary:  Negative for flank pain.  Musculoskeletal: Negative for back pain.  Skin: Negative for rash.  Neurological: Negative for weakness, numbness and headaches.  Psychiatric/Behavioral: Negative for behavioral problems.    Allergies  Review of patient's allergies indicates no known allergies.  Home Medications   Current Outpatient Rx  Name Route Sig Dispense Refill  . CALCIUM PO Oral Take 1 tablet by mouth 2 (two) times daily.      . DOFETILIDE 250 MCG PO CAPS Oral Take 1 capsule (250 mcg total) by mouth 2 (two) times daily. 180 capsule 3  . LOSARTAN POTASSIUM 50 MG PO TABS Oral Take 1 tablet (50 mg total) by mouth daily. 90 tablet 3  . OVER THE COUNTER MEDICATION Topical Apply 1 application topically daily as needed. "athletes foot cream"       BP 114/70  Pulse 67  Resp 20  Ht 6\' 3"  (1.905 m)  Wt 171 lb (77.565 kg)  BMI 21.37 kg/m2  SpO2 98%  Physical Exam  Nursing note and vitals reviewed. Constitutional: She is oriented to person, place, and time. She appears well-developed and well-nourished.  HENT:  Head: Normocephalic and atraumatic.  Eyes: EOM are normal. Pupils are equal, round, and reactive to light.  Neck: Normal range of motion. Neck supple.  Cardiovascular: Normal rate, regular rhythm and normal heart sounds.   No murmur heard.      Patient appears to be going in and out of sinus rhythm. She has episodes of tachycardia and will go back to a  normal sinus rhythm with a normal rate.  Pulmonary/Chest: Effort normal and breath sounds normal. No respiratory distress. She has no wheezes. She has no rales.  Abdominal: Soft. Bowel sounds are normal. She exhibits no distension. There is no tenderness. There is no rebound and no guarding.  Musculoskeletal: Normal range of motion.  Neurological: She is alert and oriented to person, place, and time. No cranial nerve deficit.  Skin: Skin is warm and dry.  Psychiatric: She has a normal mood and affect. Her speech is normal.    ED  Course  Procedures (including critical care time)  Labs Reviewed  CBC - Abnormal; Notable for the following:    MCV 77.2 (*)    MCH 25.1 (*)    All other components within normal limits  POCT I-STAT, CHEM 8 - Abnormal; Notable for the following:    Glucose, Bld 114 (*)    All other components within normal limits  TROPONIN I  I-STAT, CHEM 8   Dg Chest Port 1 View  08/09/2011  *RADIOLOGY REPORT*  Clinical Data: Shortness of breath and cough.  Tachycardia.  PORTABLE CHEST - 1 VIEW  Comparison: None.  Findings: Trachea is midline.  Heart size normal.  Lungs are clear. No pleural fluid.  IMPRESSION: No acute findings.  Original Report Authenticated By: Reyes Ivan, M.D.     1. Atrial fibrillation with RVR      Date: 08/09/2011  Rate: 100  Rhythm: sinus tachycardia  QRS Axis: normal  Intervals: normal  ST/T Wave abnormalities: repolarization abnormalities laterally and inferiorly simmilar to previous.   Conduction Disutrbances:first-degree A-V block   Narrative Interpretation: LVH  Old EKG Reviewed: unchanged    MDM  Patient has had URI symptoms. She's also been having episodes today of atrial fibrillation with RVR. She's a history of the same for which she is on Tikosyn. She has been going to A. fib with RVR and back to sinus rhythm here she has some LVH with strain on her EKG. She is back in sinus rhythm after 2.5 mg of IV metoprolol. Patient was seen by [facility] cardiology, and he will admit her to the hospital.        Juliet Rude. Rubin Payor, MD 08/09/11 1906

## 2011-08-10 ENCOUNTER — Other Ambulatory Visit: Payer: Self-pay

## 2011-08-10 DIAGNOSIS — R9431 Abnormal electrocardiogram [ECG] [EKG]: Secondary | ICD-10-CM

## 2011-08-10 LAB — BASIC METABOLIC PANEL
BUN: 14 mg/dL (ref 6–23)
CO2: 28 mEq/L (ref 19–32)
Chloride: 105 mEq/L (ref 96–112)
Glucose, Bld: 104 mg/dL — ABNORMAL HIGH (ref 70–99)
Potassium: 3.8 mEq/L (ref 3.5–5.1)
Sodium: 142 mEq/L (ref 135–145)

## 2011-08-10 LAB — CBC
HCT: 36.1 % (ref 36.0–46.0)
Hemoglobin: 11.8 g/dL — ABNORMAL LOW (ref 12.0–15.0)
MCH: 25.3 pg — ABNORMAL LOW (ref 26.0–34.0)
MCHC: 32.7 g/dL (ref 30.0–36.0)
MCV: 77.5 fL — ABNORMAL LOW (ref 78.0–100.0)
RBC: 4.66 MIL/uL (ref 3.87–5.11)

## 2011-08-10 LAB — PROTIME-INR
INR: 1.02 (ref 0.00–1.49)
Prothrombin Time: 13.6 seconds (ref 11.6–15.2)

## 2011-08-10 LAB — CREATININE, SERUM: Creatinine, Ser: 0.82 mg/dL (ref 0.50–1.10)

## 2011-08-10 MED ORDER — WARFARIN SODIUM 5 MG PO TABS: 5.0000 mg | ORAL_TABLET | Freq: Every day | ORAL | Status: DC

## 2011-08-10 MED ORDER — METOPROLOL TARTRATE 25 MG PO TABS
25.0000 mg | ORAL_TABLET | Freq: Two times a day (BID) | ORAL | Status: DC
  Administered 2011-08-10: 25 mg via ORAL
  Filled 2011-08-10 (×2): qty 1

## 2011-08-10 MED ORDER — METOPROLOL TARTRATE 25 MG PO TABS: 25.0000 mg | ORAL_TABLET | Freq: Two times a day (BID) | ORAL | Status: DC

## 2011-08-10 MED ORDER — POTASSIUM CHLORIDE ER 10 MEQ PO TBCR: 20.0000 meq | EXTENDED_RELEASE_TABLET | Freq: Every day | ORAL | Status: DC

## 2011-08-10 NOTE — Discharge Summary (Signed)
Physician Discharge Summary  Patient ID: [name],  MRN: [id number], DOB/AGE: 59-03-1951 59 y.o.  Admit date: 08/09/2011 Discharge date: 08/10/2011  Primary Discharge Diagnosis:  1. Paroxysmal atrial fibrillation  - initiated on BB this admission as well as Coumadin 2. Question of HCM - 2D Echocardiogram 06/2010 demonstrating mild focal basal hypertrophy of the septum, EF 55-60%, doppler parameters were c/w restrictive physiology with grade 3 diastolic dysfunction, mild SAM of MV chordae with mild-mod MR - for outpatient cardiac MRI 3. URI, likely sinus infection  Secondary Discharge Diagnosis:  1. HTN 2. Obesity  Outstanding lab tests -- The patient had a baseline PT/INR drawn prior to discharge. I will review this weekend when it results.  Hospital Course: [name] presented initially to Urgent Care for URI eval. She had had a sinus infection about one month ago that got better. About 1 week ago, she got congested again so presented to UC for eval. While there, she said she felt her heart racing and the monitor showed increased HR. She was subsequently sent to the [facility] ER for further evaluation. She was found to have bursts of afib/flutter and beta blocker was initiated in ER. Dr. Tenny Craw also makes note of adding BB for ectopy, so this was added q6hr. Tikosyn was continued. TSH was checked but is not back yet. She was observed overnight and was mostly in sinus rhythm. Dr. Graciela Husbands with EP saw the patient today and felt that with her qt prolongation, she is not a candidate for uptitration of more Tikosyn. He feels she would be for amiodarone but is very young. He felt that he would have a low threshold to refer for consideration of PVI but in this regard, clarifying the diagnosis of HCM is important as there are some concerns about the effectiveness in this subset of patients. The patient is known to have a CHADS2 score of 1 and CHADSVASC of 2, so Dr. Graciela Husbands recommended initiation of  anticoagulation therapy which was done at discharge - he discussed Coumadin and the alternatives in depth with the patient. I spoke with the patient directly, again reviewed the medications, and she prefers to start Coumadin. (She was on it in 2011 and was taken off after she hadn't had recurrent afib and was felt at low risk at that time for thromboembolic events, so was familiar with the medicine.) In regards to question of HCM, Dr. Graciela Husbands felt that this diagnosis needs to be pursued given the assymmetry on her echo with a basal septum of about 14mm. This has implications as noted above in terms of therapeutics as well as prognostic issues related to genetics. He would like her to have an outpatient cardiac MRI. In regards to meds, he would like her to go home on metoprolol 25mg  bid, daily potassium repletion (per verbal discussion), and new initiation of Coumadin. Dr. Graciela Husbands has seen & examined the patient today and feels she is stable for discharge.  In regards to the original complaint of her URI, the patient did not appear to be septic and felt to be a probable sinus infection. Dr. Graciela Husbands did not want to initiate antibiotic therapy. The patient was afebrile with a normal WBC count.  Discharge Vitals: Blood pressure 130/78, pulse 70, temperature 98.4 F (36.9 C), temperature source Oral, resp. rate 16, height 5\' 3"  (1.6 m), weight 174 lb 12.8 oz (79.289 kg), SpO2 98.00%.  Labs: Lab Results  Component Value Date   WBC 8.6 08/10/2011   HGB 11.8* 08/10/2011  HCT 36.1 08/10/2011   MCV 77.5* 08/10/2011   PLT 313 08/10/2011    Lab 08/10/11 0545  NA 142  K 3.8  CL 105  CO2 28  BUN 14  CREATININE 0.90  CALCIUM 9.7  PROT --  BILITOT --  ALKPHOS --  ALT --  AST --  GLUCOSE 104*    Basename 08/09/11 1751  CKTOTAL --  CKMB --  TROPONINI <0.30  TSH was normal, 0.777  Diagnostic Studies/Procedures:  1. 08/09/2011 PORTABLE CHEST - 1 VIEW -  IMPRESSION: No acute findings.    Discharge  Medications:  Current Discharge Medication List    START taking these medications   Details  metoprolol tartrate (LOPRESSOR) 25 MG tablet Take 1 tablet (25 mg total) by mouth 2 (two) times daily. Qty: 60 tablet, Refills: 6    potassium chloride (K-DUR) 10 MEQ tablet Take 2 tablets (20 mEq total) by mouth daily. You want to take TOTAL daily. (The pharmacy may substitute a tablet - if that is the case, take only 1 tablet daily.)  I gave the patient a handwritten prescription for tablets - [facility] would not give me the option - only has tablets in the [facility]    warfarin (COUMADIN) 5 MG tablet Take 1 tablet (5 mg total) by mouth daily. Our office will contact you this week to set up a time to check your Coumadin level (INR). Until then, continue this dose. Qty: 30 tablet, Refills: 2      CONTINUE these medications which have NOT CHANGED   Details  CALCIUM PO Take 1 tablet by mouth 2 (two) times daily.      dofetilide (TIKOSYN) 250 MCG capsule Take 1 capsule (250 mcg total) by mouth 2 (two) times daily. Qty: 180 capsule, Refills: 3    losartan (COZAAR) 50 MG tablet Take 1 tablet (50 mg total) by mouth daily. Qty: 90 tablet, Refills: 3   Associated Diagnoses: Essential hypertension, benign; Chronic systolic heart failure    OVER THE COUNTER MEDICATION Apply 1 application topically daily as needed. "athletes foot cream"       STOP taking these medications     estrogens, conjugated, (PREMARIN) 0.625 MG tablet      olmesartan (BENICAR) 40 MG tablet         Disposition:  The patient will be discharged in stable condition to home. Discharge Orders    Future Orders Please Complete By Expires   Diet - low sodium heart healthy      Increase activity slowly        Follow-up Information    Follow up with [facility]. (Our office will call you to schedule Coumadin Clinic visit, your cardiac MRI, and to follow-up with Dr. Johney Frame.)    Contact information:   [address] [place] [address]          Duration of Discharge Encounter: Greater than 30 minutes including physician and PA time.  Signed, Kriste Basque Arrin Ishler PA-C 08/10/2011, 1:07 PM

## 2011-08-10 NOTE — Progress Notes (Addendum)
Patient Name: [name]    ASSESSMENT AND PLAN:  Patient Active Hospital Problem List: Atrial fibrillation (03/09/2009)   Assessment:  Mostly in sinus rhythm.  With her qt prolongation , she is not a candidate for more tikosyn.  She would be for amio but she is very young.  Her LA dimension is only 42 or so, so i would have a low threshold to refer for consideration of PVI  In this regard, the diagnosis of HCM becomes more relevant as there are some concerns about the effectiveness of straight PVI in this cohort;  Some prefer prompt referral for convergent ablation She has a CHADS score of 1 and CHADSVAS of 2  As such, i have recommended long term anticoagulation.  We have discussed both coumadin and the alternatives. HCM seems to be a somewhat higher risk subgroup anyway      Plan: 1) continue lopressor 2) discharge and referral to Dr Fawn Kirk for PVI 3) needs anticoagulation HYPERTROPHIC CARDIOMYOPATHY (11/10/2008)   Assessment: this diagnosis needs to be pursued given the assymmetry on her echo with a basal septum of about 14mm.  This has implications as noted above in terms of therapeutics as well as prognostic issues related to genetics  Her daughter already carries a diagnosis of "CHF"    Plan: outpt MRI HYPERTENSION, BENIGN (10/12/2008)   Assessment: as above   Plan: continue current meds well contolled presently  QT prolongation --precludes tikosyn uptitration  Huston Foley --will decrease metoprolol to 25 bid       SUBJECTIVE: feels better with fewer palpitions  Past Medical History  Diagnosis Date  . Hypertension   . Obesity   . Atrial fibrillation     PHYSICAL EXAM Filed Vitals:   08/09/11 1834 08/09/11 1904 08/09/11 2115 08/10/11 0500  BP: 135/86 114/70 144/91 113/76  Pulse: 73 67 92 58  Temp:   99.4 F (37.4 C) 98.4 F (36.9 C)  TempSrc:   Oral Oral  Resp:   18 16  Height:   5\' 3"  (1.6 m)   Weight:   171 lb 15.3 oz (78 kg) 174 lb 12.8 oz (79.289 kg)  SpO2:   96%  98%    General appearance: alert, cooperative, appears stated age and no distress Neck: no adenopathy, no carotid bruit, no JVD and supple, symmetrical, trachea midline Lungs: clear to auscultation bilaterally Heart: regular rate and rhythm, S1: normal, S2: normal, S4 present and systolic murmur: early systolic 1/6, not increase with valsalva at lower left sternal border Abdomen: soft, non-tender; bowel sounds normal; no masses,  no organomegaly Extremities: extremities normal, atraumatic, no cyanosis or edema Pulses: 2+ and symmetric Skin: Skin color, texture, turgor normal. No rashes or lesions Neurologic: Alert and oriented X 3, normal strength and tone. Normal symmetric reflexes. Normal coordination and gait  TELEMETRY: Reviewed telemetry pt in sinus with burst of atrial fib at 150-180 for a bout  30 sec last pm:    Intake/Output Summary (Last 24 hours) at 08/10/11 0957 Last data filed at 08/10/11 0700  Gross per 24 hour  Intake    480 ml  Output    225 ml  Net    255 ml    LABS: Basic Metabolic Panel:  Lab 08/10/11 1610 08/10/11 0028 08/09/11 1813  NA 142 -- 143  K 3.8 -- 3.8  CL 105 -- 105  CO2 28 -- --  GLUCOSE 104* -- 114*  BUN 14 -- 15  CREATININE 0.90 0.82 0.90  CALCIUM 9.7 -- --  MG -- -- --  PHOS -- -- --   Cardiac Enzymes:  Basename 08/09/11 1751  CKTOTAL --  CKMB --  CKMBINDEX --  TROPONINI <0.30   CBC:  Lab 08/10/11 0028 08/09/11 1813 08/09/11 1751  WBC 8.6 -- 7.0  NEUTROABS -- -- --  HGB 11.8* 13.9 12.3  HCT 36.1 41.0 37.9  MCV 77.5* -- 77.2*  PLT 313 -- 313   ECG  Sinus with 56  .21/.08/47 repol abnormality with LVH  Signed, Sherryl Manges MD  08/10/2011

## 2011-08-10 NOTE — Progress Notes (Signed)
DC'd pt home with daughter. DC'd IV with catheter intact. RN reviewed dc instructions and med rec with pt and dtr. Pt verbalized understanding of follow up appointments with MD. c Sally-Anne Wamble, rn

## 2011-08-11 ENCOUNTER — Telehealth: Payer: Self-pay | Admitting: Physician Assistant

## 2011-08-11 NOTE — Telephone Encounter (Signed)
Accidentally opened phone note. Please disregard

## 2011-08-14 ENCOUNTER — Ambulatory Visit (INDEPENDENT_AMBULATORY_CARE_PROVIDER_SITE_OTHER): Payer: Self-pay | Admitting: Internal Medicine

## 2011-08-14 ENCOUNTER — Ambulatory Visit (INDEPENDENT_AMBULATORY_CARE_PROVIDER_SITE_OTHER): Payer: Self-pay | Admitting: *Deleted

## 2011-08-14 ENCOUNTER — Encounter: Payer: Self-pay | Admitting: Internal Medicine

## 2011-08-14 VITALS — BP 126/76 | HR 66 | Resp 20 | Ht 63.0 in | Wt 169.8 lb

## 2011-08-14 DIAGNOSIS — I4891 Unspecified atrial fibrillation: Secondary | ICD-10-CM

## 2011-08-14 DIAGNOSIS — I10 Essential (primary) hypertension: Secondary | ICD-10-CM

## 2011-08-14 DIAGNOSIS — I421 Obstructive hypertrophic cardiomyopathy: Secondary | ICD-10-CM

## 2011-08-14 DIAGNOSIS — Z7901 Long term (current) use of anticoagulants: Secondary | ICD-10-CM | POA: Insufficient documentation

## 2011-08-14 LAB — POCT INR: INR: 1.1

## 2011-08-14 NOTE — Assessment & Plan Note (Signed)
The patient has symptomatic afib and atrial flutter.  This is complicated (and likely secondary to) hypertrophic heart disease.  Presently, she feels that her afib is controlled on tikosyn.  She thinks last weeks afib episode was due to URI. Therapeutic strategies for afib including medicine and ablation were discussed in detail with the patient today. Risk, benefits, and alternatives to EP study and radiofrequency ablation for afib were also discussed in detail today.  At this time, she is very clear in her decision to decline ablation.  If her afib worsens, then she will reconsider.   She will be enrolled in our coumadin clinic for management of INRs. She will follow-up with Dr Antoine Poche in 6 weeks.  I will see her as needed.

## 2011-08-14 NOTE — Assessment & Plan Note (Signed)
Stable No change required today  

## 2011-08-14 NOTE — Patient Instructions (Signed)
Your physician recommends that you schedule a follow-up appointment in: 6 weeks with Dr Antoine Poche  Get set up in the [facility] clinic

## 2011-08-14 NOTE — Assessment & Plan Note (Signed)
Echo suggests hypertrophic heart disease.  Dr Graciela Husbands ordered a cardiac MRI.  The patient has no insurance and therefore is reluctant to proceed with MRI at this time.  No changes today.

## 2011-08-14 NOTE — Progress Notes (Signed)
PCP:  Leo Grosser, MD, MD  The patient presents today for electrophysiology followup after her recent hospitalization for afib.  She has known afib for which she is chronically anticoagulated with tikosyn.  She reports that her afib has been well controlled until last week when she developed URI symptoms.  She subsequently developed tachypalpitations.  She presented to [facility] and was found to have afib and atrial flutter.  She was placed on metoprolol.  She has done very well since that time with no further symptosm of arrhythmia.  She has assymmetric LVH with SAM for which an MRI was ordered by Dr Graciela Husbands.  She has not yet had her MRI performed.  She was also placed on coumadin but has not yet been established in the coumadin clinic.  Today, she denies symptoms of palpitations, chest pain, shortness of breath, orthopnea, PND, lower extremity edema, dizziness, presyncope, syncope, or neurologic sequela.  The patient feels that she is tolerating medications without difficulties and is otherwise without complaint today.   Past Medical History  Diagnosis Date  . Hypertension   . Obesity   . Atrial fibrillation   . Atrial flutter   . Left ventricular hypertrophy     possibly hypertrophic cardiomyopathy   No past surgical history on file.  Current Outpatient Prescriptions  Medication Sig Dispense Refill  . CALCIUM PO Take 1 tablet by mouth 2 (two) times daily.        Marland Kitchen dofetilide (TIKOSYN) 250 MCG capsule Take 1 capsule (250 mcg total) by mouth 2 (two) times daily.  180 capsule  3  . hydrocortisone 1 % lotion Apply 1 application topically daily. For dry skin on face.       Marland Kitchen losartan (COZAAR) 50 MG tablet Take 1 tablet (50 mg total) by mouth daily.  90 tablet  3  . metoprolol tartrate (LOPRESSOR) 25 MG tablet Take 1 tablet (25 mg total) by mouth 2 (two) times daily.  60 tablet  6  . OVER THE COUNTER MEDICATION Apply 1 application topically daily as needed. "athletes foot cream"       .  potassium chloride (K-DUR) 10 MEQ tablet Take 2 tablets (20 mEq total) by mouth daily. You want to take TOTAL daily. (The pharmacy may substitute a tablet - if that is the case, take only 1 tablet daily.)      . warfarin (COUMADIN) 5 MG tablet Take 1 tablet (5 mg total) by mouth daily. Our office will contact you this week to set up a time to check your Coumadin level (INR). Until then, continue this dose.  30 tablet  2    No Known Allergies  History   Social History  . Marital Status: Divorced    Spouse Name: N/A    Number of Children: N/A  . Years of Education: N/A   Occupational History  . Not on file.   Social History Main Topics  . Smoking status: Never Smoker   . Smokeless tobacco: Not on file  . Alcohol Use: No  . Drug Use: No  . Sexually Active: Not on file   Other Topics Concern  . Not on file   Social History Narrative  . No narrative on file    ROS-  All systems are reviewed and are negative except as outlined in the HPI above   Physical Exam: Filed Vitals:   08/14/11 1407  BP: 126/76  Pulse: 66  Resp: 20  Height: 5\' 3"  (1.6 m)  Weight:  169 lb 12.8 oz (77.021 kg)    GEN- The patient is well appearing, alert and oriented x 3 today.   Head- normocephalic, atraumatic Eyes-  Sclera clear, conjunctiva pink Ears- hearing intact Oropharynx- clear Neck- supple, no JVP Lymph- no cervical lymphadenopathy Lungs- Clear to ausculation bilaterally, normal work of breathing Heart- Regular rate and rhythm, no murmurs, rubs or gallops, PMI not laterally displaced GI- soft, NT, ND, + BS Extremities- no clubbing, cyanosis, or edema  ekg today reveals sinus rhythm 64 bpm, PR 212, QRS 84, QTc 439, LVH with diffuse TWI  Assessment and Plan:

## 2011-08-20 NOTE — Discharge Summary (Signed)
afib Need to exclude HCM

## 2011-08-21 ENCOUNTER — Ambulatory Visit (INDEPENDENT_AMBULATORY_CARE_PROVIDER_SITE_OTHER): Payer: Self-pay | Admitting: *Deleted

## 2011-08-21 DIAGNOSIS — Z7901 Long term (current) use of anticoagulants: Secondary | ICD-10-CM

## 2011-08-21 DIAGNOSIS — I4891 Unspecified atrial fibrillation: Secondary | ICD-10-CM

## 2011-08-21 LAB — POCT INR: INR: 1.6

## 2011-08-21 MED ORDER — WARFARIN SODIUM 5 MG PO TABS: 5.0000 mg | ORAL_TABLET | Freq: Every day | ORAL | Status: DC

## 2011-08-28 ENCOUNTER — Ambulatory Visit (INDEPENDENT_AMBULATORY_CARE_PROVIDER_SITE_OTHER): Payer: Self-pay | Admitting: *Deleted

## 2011-08-28 DIAGNOSIS — Z7901 Long term (current) use of anticoagulants: Secondary | ICD-10-CM

## 2011-08-28 DIAGNOSIS — I4891 Unspecified atrial fibrillation: Secondary | ICD-10-CM

## 2011-09-04 ENCOUNTER — Ambulatory Visit (INDEPENDENT_AMBULATORY_CARE_PROVIDER_SITE_OTHER): Payer: Self-pay | Admitting: *Deleted

## 2011-09-04 DIAGNOSIS — Z7901 Long term (current) use of anticoagulants: Secondary | ICD-10-CM

## 2011-09-04 DIAGNOSIS — I4891 Unspecified atrial fibrillation: Secondary | ICD-10-CM

## 2011-09-04 LAB — POCT INR: INR: 1.8

## 2011-09-11 ENCOUNTER — Ambulatory Visit (INDEPENDENT_AMBULATORY_CARE_PROVIDER_SITE_OTHER): Payer: Self-pay | Admitting: *Deleted

## 2011-09-11 DIAGNOSIS — Z7901 Long term (current) use of anticoagulants: Secondary | ICD-10-CM

## 2011-09-11 DIAGNOSIS — I4891 Unspecified atrial fibrillation: Secondary | ICD-10-CM

## 2011-09-11 LAB — POCT INR: INR: 1.9

## 2011-09-23 ENCOUNTER — Ambulatory Visit (INDEPENDENT_AMBULATORY_CARE_PROVIDER_SITE_OTHER): Payer: Self-pay | Admitting: *Deleted

## 2011-09-23 DIAGNOSIS — I4891 Unspecified atrial fibrillation: Secondary | ICD-10-CM

## 2011-09-23 DIAGNOSIS — Z7901 Long term (current) use of anticoagulants: Secondary | ICD-10-CM

## 2011-09-27 ENCOUNTER — Ambulatory Visit (INDEPENDENT_AMBULATORY_CARE_PROVIDER_SITE_OTHER): Payer: Self-pay | Admitting: Cardiology

## 2011-09-27 ENCOUNTER — Encounter: Payer: Self-pay | Admitting: Cardiology

## 2011-09-27 DIAGNOSIS — I421 Obstructive hypertrophic cardiomyopathy: Secondary | ICD-10-CM

## 2011-09-27 DIAGNOSIS — I4891 Unspecified atrial fibrillation: Secondary | ICD-10-CM

## 2011-09-27 DIAGNOSIS — I10 Essential (primary) hypertension: Secondary | ICD-10-CM

## 2011-09-27 NOTE — Assessment & Plan Note (Signed)
She declined further evaluation with an MRI. She's not having any symptoms. She will continue with medications as listed.   

## 2011-09-27 NOTE — Patient Instructions (Signed)
The current medical regimen is effective;  continue present plan and medications.  Follow up in 6 months with Dr Hochrein.  You will receive a letter in the mail 2 months before you are due.  Please call us when you receive this letter to schedule your follow up appointment.  

## 2011-09-27 NOTE — Assessment & Plan Note (Signed)
She declined consideration of ablation. She's maintaining sinus rhythm on Tikosyn.  I will continue this therapy.

## 2011-09-27 NOTE — Assessment & Plan Note (Signed)
The blood pressure is at target. No change in medications is indicated. We will continue with therapeutic lifestyle changes (TLC).  

## 2011-09-27 NOTE — Progress Notes (Signed)
   HPI The patient presents for followup of atrial fibrillation and hypertrophic cardiomyopathy. She was last seen by Dr. Johney Frame.  No change to therapy was indicated. Since that visit she has maintained sinus rhythm and doesn't have palpitations. She's not having any shortness of breath, PND or orthopnea. She's remaining active though not exercising. There is a new baby in her house which is taking her time. She denies any chest pressure, neck or arm discomfort.  No Known Allergies  Current Outpatient Prescriptions  Medication Sig Dispense Refill  . CALCIUM PO Take 1 tablet by mouth 2 (two) times daily.        Marland Kitchen dofetilide (TIKOSYN) 250 MCG capsule Take 1 capsule (250 mcg total) by mouth 2 (two) times daily.  180 capsule  3  . hydrocortisone 1 % lotion Apply 1 application topically daily. For dry skin on face.       Marland Kitchen losartan (COZAAR) 50 MG tablet Take 1 tablet (50 mg total) by mouth daily.  90 tablet  3  . metoprolol tartrate (LOPRESSOR) 25 MG tablet Take 1 tablet (25 mg total) by mouth 2 (two) times daily.  60 tablet  6  . OVER THE COUNTER MEDICATION Apply 1 application topically daily as needed. "athletes foot cream"       . potassium chloride (K-DUR) 10 MEQ tablet Take 2 tablets (20 mEq total) by mouth daily. You want to take TOTAL daily. (The pharmacy may substitute a tablet - if that is the case, take only 1 tablet daily.)      . warfarin (COUMADIN) 5 MG tablet Take 1 tablet (5 mg total) by mouth daily.  60 tablet  2    Past Medical History  Diagnosis Date  . Hypertension   . Obesity   . Atrial fibrillation   . Atrial flutter   . Left ventricular hypertrophy     possibly hypertrophic cardiomyopathy    No past surgical history on file.  ROS:  As stated in the HPI and negative for all other systems.  PHYSICAL EXAM BP 114/73  Pulse 59  Ht 5\' 3"  (1.6 m)  Wt 174 lb (78.926 kg)  BMI 30.82 kg/m2 GENERAL:  Well appearing HEENT:  Pupils equal round and reactive, fundi  not visualized, oral mucosa unremarkable NECK:  No jugular venous distention, waveform within normal limits, carotid upstroke brisk and symmetric, no bruits, no thyromegaly LYMPHATICS:  No cervical, inguinal adenopathy LUNGS:  Clear to auscultation bilaterally BACK:  No CVA tenderness CHEST:  Unremarkable HEART:  PMI not displaced or sustained,S1 and S2 within normal limits, no S3, no S4, no clicks, no rubs, no murmurs ABD:  Flat, positive bowel sounds normal in frequency in pitch, no bruits, no rebound, no guarding, no midline pulsatile mass, no hepatomegaly, no splenomegaly EXT:  2 plus pulses throughout, no edema, no cyanosis no clubbing SKIN:  No rashes no nodules NEURO:  Cranial nerves II through XII grossly intact, motor grossly intact throughout PSYCH:  Cognitively intact, oriented to person place and time   ASSESSMENT AND PLAN

## 2011-10-07 ENCOUNTER — Ambulatory Visit (INDEPENDENT_AMBULATORY_CARE_PROVIDER_SITE_OTHER): Payer: Self-pay | Admitting: *Deleted

## 2011-10-07 DIAGNOSIS — I4891 Unspecified atrial fibrillation: Secondary | ICD-10-CM

## 2011-10-07 DIAGNOSIS — Z7901 Long term (current) use of anticoagulants: Secondary | ICD-10-CM

## 2011-10-07 LAB — POCT INR: INR: 2.3

## 2011-10-17 ENCOUNTER — Telehealth: Payer: Self-pay | Admitting: Cardiology

## 2011-10-17 NOTE — Telephone Encounter (Signed)
New Problem   Patient would like return call from nurse to discuss medication, she can be reached at   (H) [phone]

## 2011-10-17 NOTE — Telephone Encounter (Signed)
Pt needs Tikosyn reordered.  Her pt ID is [id number] [phone] pt assistance program Requested - [id number] order number and should be in in 4 to 6 days

## 2011-10-28 ENCOUNTER — Ambulatory Visit (INDEPENDENT_AMBULATORY_CARE_PROVIDER_SITE_OTHER): Payer: Self-pay | Admitting: Pharmacist

## 2011-10-28 DIAGNOSIS — I4891 Unspecified atrial fibrillation: Secondary | ICD-10-CM

## 2011-10-28 DIAGNOSIS — Z7901 Long term (current) use of anticoagulants: Secondary | ICD-10-CM

## 2011-10-28 LAB — POCT INR: INR: 1.9

## 2011-11-18 ENCOUNTER — Ambulatory Visit (INDEPENDENT_AMBULATORY_CARE_PROVIDER_SITE_OTHER): Payer: Self-pay | Admitting: Pharmacist

## 2011-11-18 DIAGNOSIS — Z7901 Long term (current) use of anticoagulants: Secondary | ICD-10-CM

## 2011-11-18 DIAGNOSIS — I4891 Unspecified atrial fibrillation: Secondary | ICD-10-CM

## 2011-11-18 MED ORDER — WARFARIN SODIUM 5 MG PO TABS: ORAL_TABLET | ORAL | Status: DC

## 2011-12-09 ENCOUNTER — Ambulatory Visit (INDEPENDENT_AMBULATORY_CARE_PROVIDER_SITE_OTHER): Payer: Self-pay | Admitting: *Deleted

## 2011-12-09 DIAGNOSIS — Z7901 Long term (current) use of anticoagulants: Secondary | ICD-10-CM

## 2011-12-09 DIAGNOSIS — I4891 Unspecified atrial fibrillation: Secondary | ICD-10-CM

## 2012-01-06 ENCOUNTER — Ambulatory Visit (INDEPENDENT_AMBULATORY_CARE_PROVIDER_SITE_OTHER): Payer: Self-pay | Admitting: *Deleted

## 2012-01-06 DIAGNOSIS — Z7901 Long term (current) use of anticoagulants: Secondary | ICD-10-CM

## 2012-01-06 DIAGNOSIS — I4891 Unspecified atrial fibrillation: Secondary | ICD-10-CM

## 2012-01-09 ENCOUNTER — Telehealth: Payer: Self-pay | Admitting: Cardiology

## 2012-01-09 NOTE — Telephone Encounter (Signed)
New msg Pt wants to talk to you about tikosyn. She gets thru assistance program. Please call her back

## 2012-01-10 ENCOUNTER — Telehealth: Payer: Self-pay | Admitting: *Deleted

## 2012-01-10 NOTE — Telephone Encounter (Signed)
Pt calling wanting tikosyn reordered as she is close to running out--advised i had reordered the med--pt agrees--nt

## 2012-01-15 ENCOUNTER — Telehealth: Payer: Self-pay | Admitting: *Deleted

## 2012-01-15 NOTE — Telephone Encounter (Signed)
Samples of Tikosyn 0.250 mg [id number] lot - exp 1/15.  Pt aware and will pick up from the front desk.

## 2012-02-03 ENCOUNTER — Ambulatory Visit (INDEPENDENT_AMBULATORY_CARE_PROVIDER_SITE_OTHER): Payer: Self-pay

## 2012-02-03 DIAGNOSIS — I4891 Unspecified atrial fibrillation: Secondary | ICD-10-CM

## 2012-02-03 DIAGNOSIS — Z7901 Long term (current) use of anticoagulants: Secondary | ICD-10-CM

## 2012-02-26 ENCOUNTER — Other Ambulatory Visit: Payer: Self-pay | Admitting: Physician Assistant

## 2012-03-16 ENCOUNTER — Ambulatory Visit (INDEPENDENT_AMBULATORY_CARE_PROVIDER_SITE_OTHER): Payer: Self-pay | Admitting: *Deleted

## 2012-03-16 DIAGNOSIS — I4891 Unspecified atrial fibrillation: Secondary | ICD-10-CM

## 2012-03-16 DIAGNOSIS — Z7901 Long term (current) use of anticoagulants: Secondary | ICD-10-CM

## 2012-03-16 LAB — POCT INR: INR: 2.2

## 2012-03-25 ENCOUNTER — Ambulatory Visit (INDEPENDENT_AMBULATORY_CARE_PROVIDER_SITE_OTHER): Payer: Self-pay | Admitting: Cardiology

## 2012-03-25 ENCOUNTER — Encounter: Payer: Self-pay | Admitting: Cardiology

## 2012-03-25 VITALS — BP 140/89 | HR 60 | Ht 63.0 in | Wt 193.0 lb

## 2012-03-25 DIAGNOSIS — I4891 Unspecified atrial fibrillation: Secondary | ICD-10-CM

## 2012-03-25 DIAGNOSIS — I10 Essential (primary) hypertension: Secondary | ICD-10-CM

## 2012-03-25 LAB — MAGNESIUM: Magnesium: 1.8 mg/dL (ref 1.5–2.5)

## 2012-03-25 LAB — BASIC METABOLIC PANEL
BUN: 16 mg/dL (ref 6–23)
Calcium: 9.3 mg/dL (ref 8.4–10.5)
Chloride: 106 mEq/L (ref 96–112)
Creatinine, Ser: 1 mg/dL (ref 0.4–1.2)

## 2012-03-25 NOTE — Assessment & Plan Note (Signed)
She declined further evaluation with an MRI. She's not having any symptoms. She will continue with medications as listed.

## 2012-03-25 NOTE — Progress Notes (Signed)
   HPI The patient presents for followup of atrial fibrillation and hypertrophic cardiomyopathy. Since I last saw her she has done well.  The patient denies any new symptoms such as chest discomfort, neck or arm discomfort. There has been no new shortness of breath, PND or orthopnea. There have been no reported palpitations, presyncope or syncope.   No Known Allergies  Current Outpatient Prescriptions  Medication Sig Dispense Refill  . CALCIUM PO Take 1 tablet by mouth 2 (two) times daily.        Marland Kitchen dofetilide (TIKOSYN) 250 MCG capsule Take 1 capsule (250 mcg total) by mouth 2 (two) times daily.  180 capsule  3  . hydrocortisone 1 % lotion Apply 1 application topically daily. For dry skin on face.       Marland Kitchen losartan (COZAAR) 50 MG tablet Take 1 tablet (50 mg total) by mouth daily.  90 tablet  3  . metoprolol tartrate (LOPRESSOR) 25 MG tablet TAKE ONE TABLET BY MOUTH TWICE DAILY  60 tablet  6  . OVER THE COUNTER MEDICATION Apply 1 application topically daily as needed. "athletes foot cream"       . potassium chloride SA (KLOR-CON M20) 20 MEQ tablet Take 1 tablet (20 mEq total) by mouth daily.  30 each  6  . warfarin (COUMADIN) 5 MG tablet Take as directed by the coumadin clinic.  60 tablet  3    Past Medical History  Diagnosis Date  . Hypertension   . Obesity   . Atrial fibrillation   . Atrial flutter   . Left ventricular hypertrophy     possibly hypertrophic cardiomyopathy    Past Surgical History  Procedure Date  . Abdominal hysterectomy     ROS:  As stated in the HPI and negative for all other systems.  PHYSICAL EXAM BP 140/89  Pulse 60  Ht 5\' 3"  (1.6 m)  Wt 193 lb (87.544 kg)  BMI 34.19 kg/m2 GENERAL:  Well appearing NECK:  No jugular venous distention, waveform within normal limits, carotid upstroke brisk and symmetric, no bruits, no thyromegaly LUNGS:  Clear to auscultation bilaterally BACK:  No CVA tenderness CHEST:  Unremarkable HEART:  PMI not displaced or  sustained,S1 and S2 within normal limits, no S3, no S4, no clicks, no rubs, no murmurs ABD:  Flat, positive bowel sounds normal in frequency in pitch, no bruits, no rebound, no guarding, no midline pulsatile mass, no hepatomegaly, no splenomegaly EXT:  2 plus pulses throughout, no edema, no cyanosis no clubbing  EKG:  Normal sinus rhythm, rate 60, axis within normal limits, QTC slightly prolonged, inferior and anterior deep T-wave inversions.  Unchanged from previous ECG.  03/25/2012  ASSESSMENT AND PLAN

## 2012-03-25 NOTE — Patient Instructions (Addendum)
The current medical regimen is effective;  continue present plan and medications.  Please have blood work today (BMP, MG)  Follow up in 6 months with Dr Antoine Poche.  You will receive a letter in the mail 2 months before you are due.  Please call us when you receive this letter to schedule your follow up appointment.

## 2012-03-25 NOTE — Assessment & Plan Note (Signed)
She has had no symptomatic paroxysms. She tolerates anticoagulation and does not want to change therapies. Although her QTC is slightly elevated this is a mild change and no change in dosing is indicated. I will check a basic metabolic profile and magnesium today.

## 2012-03-25 NOTE — Assessment & Plan Note (Signed)
The blood pressure is at target. No change in medications is indicated. We will continue with therapeutic lifestyle changes (TLC).  

## 2012-03-30 ENCOUNTER — Telehealth: Payer: Self-pay | Admitting: Cardiology

## 2012-03-30 ENCOUNTER — Telehealth: Payer: Self-pay

## 2012-03-30 NOTE — Telephone Encounter (Signed)
New problem:  Returning call back to [facility]  - test results

## 2012-03-30 NOTE — Telephone Encounter (Signed)
left message for patient to call about lab result that are normal

## 2012-03-30 NOTE — Telephone Encounter (Signed)
Advised patient of lab results  

## 2012-04-03 ENCOUNTER — Telehealth: Payer: Self-pay | Admitting: Cardiology

## 2012-04-03 NOTE — Telephone Encounter (Signed)
Patient called was told Dr.Hochrein's nurse out of office today will send message to her and she will call back next week.

## 2012-04-03 NOTE — Telephone Encounter (Signed)
New msg Pt wants to order tikosyn from [employer] Please call

## 2012-04-06 ENCOUNTER — Encounter: Payer: Self-pay | Admitting: Cardiology

## 2012-04-06 NOTE — Telephone Encounter (Signed)
[name] 04/06/2012 10:20 AM Signed  Please return call to patient [phone] regarding Tikosyn refill  Enrolled through 07/24/2012  Call [facility] [phone] to re-order her medication Pt ID # [id number] Reorder # [id number]  Reorder done --- order # [id number]  Allow 7-10 days to receive medication  Pt aware

## 2012-04-06 NOTE — Telephone Encounter (Signed)
This encounter was created in error - please disregard.

## 2012-04-06 NOTE — Telephone Encounter (Signed)
Please return call to patient  [phone] regarding Tikosyn refill

## 2012-04-08 ENCOUNTER — Other Ambulatory Visit: Payer: Self-pay | Admitting: Physician Assistant

## 2012-04-15 ENCOUNTER — Telehealth: Payer: Self-pay | Admitting: *Deleted

## 2012-04-15 NOTE — Telephone Encounter (Signed)
Pt aware her Tikosyn samples are in and will be at the front desk.  Lot # [id number] exp 11/13/2013 #270 for a 90 day supply.

## 2012-04-27 ENCOUNTER — Ambulatory Visit (INDEPENDENT_AMBULATORY_CARE_PROVIDER_SITE_OTHER): Payer: Self-pay | Admitting: *Deleted

## 2012-04-27 DIAGNOSIS — I4891 Unspecified atrial fibrillation: Secondary | ICD-10-CM

## 2012-04-27 DIAGNOSIS — Z7901 Long term (current) use of anticoagulants: Secondary | ICD-10-CM

## 2012-04-27 LAB — POCT INR: INR: 3.1

## 2012-04-29 ENCOUNTER — Other Ambulatory Visit: Payer: Self-pay | Admitting: Cardiology

## 2012-06-01 ENCOUNTER — Other Ambulatory Visit (HOSPITAL_COMMUNITY): Payer: Self-pay | Admitting: Family Medicine

## 2012-06-01 DIAGNOSIS — Z1231 Encounter for screening mammogram for malignant neoplasm of breast: Secondary | ICD-10-CM

## 2012-06-08 ENCOUNTER — Ambulatory Visit (INDEPENDENT_AMBULATORY_CARE_PROVIDER_SITE_OTHER): Payer: Self-pay

## 2012-06-08 DIAGNOSIS — Z7901 Long term (current) use of anticoagulants: Secondary | ICD-10-CM

## 2012-06-08 DIAGNOSIS — I4891 Unspecified atrial fibrillation: Secondary | ICD-10-CM

## 2012-06-08 LAB — POCT INR: INR: 3.1

## 2012-06-11 ENCOUNTER — Ambulatory Visit (HOSPITAL_COMMUNITY)
Admission: RE | Admit: 2012-06-11 | Discharge: 2012-06-11 | Disposition: A | Discharge status: Home / Self Care | Payer: Self-pay | Source: Ambulatory Visit | Attending: Family Medicine | Admitting: Family Medicine

## 2012-06-11 DIAGNOSIS — Z1231 Encounter for screening mammogram for malignant neoplasm of breast: Secondary | ICD-10-CM

## 2012-07-03 ENCOUNTER — Other Ambulatory Visit: Payer: Self-pay | Admitting: Cardiology

## 2012-07-03 NOTE — Telephone Encounter (Signed)
Pt needs tykosin ordered from [employer] the drug company

## 2012-07-03 NOTE — Telephone Encounter (Addendum)
Ordered refill for Tikosyn at 1 [phone] thru [employer]. Order confirmation is [id number]. Patient ID is [id number] Will be received in 7 to 10 days.

## 2012-07-08 MED ORDER — DOFETILIDE 250 MCG PO CAPS: 250.0000 ug | ORAL_CAPSULE | Freq: Two times a day (BID) | ORAL | Status: DC

## 2012-07-13 ENCOUNTER — Ambulatory Visit (INDEPENDENT_AMBULATORY_CARE_PROVIDER_SITE_OTHER): Payer: Self-pay | Admitting: *Deleted

## 2012-07-13 ENCOUNTER — Telehealth: Payer: Self-pay | Admitting: Cardiology

## 2012-07-13 ENCOUNTER — Telehealth: Payer: Self-pay | Admitting: *Deleted

## 2012-07-13 DIAGNOSIS — I4891 Unspecified atrial fibrillation: Secondary | ICD-10-CM

## 2012-07-13 DIAGNOSIS — Z7901 Long term (current) use of anticoagulants: Secondary | ICD-10-CM

## 2012-07-13 NOTE — Telephone Encounter (Signed)
Pt inquiring if Pam RN for Dr Antoine Poche received msg that she needed her tikosyn ordered. Informed her that i will forward a MSG since nurse out of office today, pt agreed to plan, told pt that nurse will call her tomorrow to update her.

## 2012-07-13 NOTE — Telephone Encounter (Signed)
Triage had a written msg asking about her tikosyn, unable to reach pt, msg left to return call on both home and cell number.

## 2012-07-13 NOTE — Telephone Encounter (Signed)
Walk In pt Form " pt Needs Medication Refill" Sent to Message Nurse  07/13/12/KM

## 2012-07-14 NOTE — Telephone Encounter (Signed)
Tikosyn samples 0.25 mg #60 X 3 obtained via [facility].  Pt aware   Lot # [id number]  Exp 01/13/14  Pt aware she will be receiving new application to be completed in the mail.

## 2012-08-16 ENCOUNTER — Other Ambulatory Visit: Payer: Self-pay | Admitting: Cardiology

## 2012-08-18 ENCOUNTER — Telehealth: Payer: Self-pay | Admitting: Cardiology

## 2012-08-18 NOTE — Telephone Encounter (Signed)
Walk in pt Form " [employer] paperwork" Pt Dropped off For Completion Sent to  Pam/Hochrein 08/18/12/KM

## 2012-08-18 NOTE — Telephone Encounter (Signed)
New Problem:    Patient called in wanting to let you know that she left paperwork for Dr. Antoine Poche after her visit today regarding some of her medication and would like you to give her a call back once you have received them.  Please call back.

## 2012-08-18 NOTE — Telephone Encounter (Signed)
Received paperwork and it has been completed.  Will give to Dr Antoine Poche to sign tomorrow.

## 2012-08-24 ENCOUNTER — Ambulatory Visit (INDEPENDENT_AMBULATORY_CARE_PROVIDER_SITE_OTHER): Payer: Self-pay | Admitting: *Deleted

## 2012-08-24 DIAGNOSIS — I4891 Unspecified atrial fibrillation: Secondary | ICD-10-CM

## 2012-08-24 DIAGNOSIS — Z7901 Long term (current) use of anticoagulants: Secondary | ICD-10-CM

## 2012-08-28 ENCOUNTER — Telehealth: Payer: Self-pay | Admitting: Cardiology

## 2012-08-28 NOTE — Telephone Encounter (Signed)
[employer] paperwork completed By [employer], Mailed to  [employer] to [facility]. [id number] [place] [address] 08/28/12/KM

## 2012-08-30 ENCOUNTER — Other Ambulatory Visit: Payer: Self-pay | Admitting: Cardiology

## 2012-09-15 NOTE — Telephone Encounter (Signed)
Paperwork has been completed and was mailed/faxed 12/13 as documented.

## 2012-09-22 ENCOUNTER — Encounter: Payer: Self-pay | Admitting: Cardiology

## 2012-09-22 ENCOUNTER — Ambulatory Visit (INDEPENDENT_AMBULATORY_CARE_PROVIDER_SITE_OTHER): Payer: Self-pay | Admitting: Cardiology

## 2012-09-22 VITALS — BP 130/82 | HR 78 | Ht 63.0 in | Wt 193.8 lb

## 2012-09-22 DIAGNOSIS — I471 Supraventricular tachycardia, unspecified: Secondary | ICD-10-CM

## 2012-09-22 DIAGNOSIS — I4891 Unspecified atrial fibrillation: Secondary | ICD-10-CM

## 2012-09-22 DIAGNOSIS — I059 Rheumatic mitral valve disease, unspecified: Secondary | ICD-10-CM

## 2012-09-22 DIAGNOSIS — I340 Nonrheumatic mitral (valve) insufficiency: Secondary | ICD-10-CM

## 2012-09-22 DIAGNOSIS — I10 Essential (primary) hypertension: Secondary | ICD-10-CM

## 2012-09-22 DIAGNOSIS — R9431 Abnormal electrocardiogram [ECG] [EKG]: Secondary | ICD-10-CM

## 2012-09-22 NOTE — Progress Notes (Signed)
HPI The patient presents for followup of atrial fibrillation and hypertrophic cardiomyopathy. Since I last saw her she has done well.  She does have cold like symptoms today. She's had some stuffiness and cough. She's not describing fevers or chills. She has some rare palpitations but no presyncope or syncope. She has no chest pressure, neck or arm discomfort. She does some walking for exercise. With this she does not report any limitations. She's getting no new shortness of breath, PND or orthopnea. She's had no weight gain or edema.  No Known Allergies  Current Outpatient Prescriptions  Medication Sig Dispense Refill  . CALCIUM PO Take 1 tablet by mouth 2 (two) times daily.        Marland Kitchen dofetilide (TIKOSYN) 250 MCG capsule Take 1 capsule (250 mcg total) by mouth 2 (two) times daily.  180 capsule  3  . hydrocortisone 1 % lotion Apply 1 application topically daily. For dry skin on face.       Marland Kitchen KLOR-CON M20 20 MEQ tablet TAKE ONE  BY MOUTH EVERY DAY  30 tablet  5  . losartan (COZAAR) 50 MG tablet TAKE ONE TABLET BY MOUTH EVERY DAY  90 tablet  3  . metoprolol tartrate (LOPRESSOR) 25 MG tablet TAKE ONE TABLET BY MOUTH TWICE DAILY  60 tablet  5  . OVER THE COUNTER MEDICATION Apply 1 application topically daily as needed. "athletes foot cream"       . warfarin (COUMADIN) 5 MG tablet TAKE AS DIRECTED BY ANTICOAGULATION  CLINIC  65 tablet  3    Past Medical History  Diagnosis Date  . Hypertension   . Obesity   . Atrial fibrillation   . Atrial flutter   . Left ventricular hypertrophy     possibly hypertrophic cardiomyopathy    Past Surgical History  Procedure Date  . Abdominal hysterectomy     ROS:  Cough and cold like symptoms.  As stated in the HPI and negative for all other systems.  PHYSICAL EXAM BP 130/82  Pulse 78  Ht 5\' 3"  (1.6 m)  Wt 193 lb 12.8 oz (87.907 kg)  BMI 34.33 kg/m2 GENERAL:  Well appearing NECK:  No jugular venous distention, waveform within normal limits,  carotid upstroke brisk and symmetric, no bruits, no thyromegaly LUNGS:  Clear to auscultation bilaterally BACK:  No CVA tenderness CHEST:  Unremarkable HEART:  PMI not displaced or sustained,S1 and S2 within normal limits, no S3, no S4, no clicks, no rubs, slight apical systolic murmur not increasing with a strain phase of Valsalva murmurs ABD:  Flat, positive bowel sounds normal in frequency in pitch, no bruits, no rebound, no guarding, no midline pulsatile mass, no hepatomegaly, no splenomegaly EXT:  2 plus pulses throughout, no edema, no cyanosis no clubbing  EKG:  Normal sinus rhythm, rate 78, axis within normal limits, QTC shortened compared with previous, inferior and anterior deep T-wave inversions.  Unchanged from previous ECG.  09/22/2012  ASSESSMENT AND PLAN  Atrial fibrillation -  She has had no symptomatic paroxysms. She tolerates anticoagulation and does not want to change therapies. She is tolerating the Tikosyn.  No change in therapy is indicated.  HYPERTENSION, BENIGN -  The blood pressure is at target. No change in medications is indicated. We will continue with therapeutic lifestyle changes (TLC).   HYPERTROPHIC CARDIOMYOPATHY - She declined further evaluation with an MRI which was suggested at the time of a hospitalization in 2012. She's not had an echocardiogram since 2011. I will  followup suggestion of SAM and hypertrophic cardiomyopathy with a repeat echocardiogram.

## 2012-09-22 NOTE — Patient Instructions (Addendum)

## 2012-09-28 ENCOUNTER — Ambulatory Visit (HOSPITAL_COMMUNITY): Payer: Self-pay | Attending: Cardiovascular Disease

## 2012-09-28 DIAGNOSIS — I421 Obstructive hypertrophic cardiomyopathy: Secondary | ICD-10-CM

## 2012-09-28 DIAGNOSIS — I10 Essential (primary) hypertension: Secondary | ICD-10-CM | POA: Insufficient documentation

## 2012-09-28 DIAGNOSIS — I369 Nonrheumatic tricuspid valve disorder, unspecified: Secondary | ICD-10-CM | POA: Insufficient documentation

## 2012-09-28 DIAGNOSIS — I422 Other hypertrophic cardiomyopathy: Secondary | ICD-10-CM | POA: Insufficient documentation

## 2012-09-28 DIAGNOSIS — I340 Nonrheumatic mitral (valve) insufficiency: Secondary | ICD-10-CM

## 2012-09-28 DIAGNOSIS — I080 Rheumatic disorders of both mitral and aortic valves: Secondary | ICD-10-CM | POA: Insufficient documentation

## 2012-09-28 DIAGNOSIS — I4891 Unspecified atrial fibrillation: Secondary | ICD-10-CM | POA: Insufficient documentation

## 2012-09-28 NOTE — Progress Notes (Signed)
Echocardiogram performed.  

## 2012-10-05 ENCOUNTER — Ambulatory Visit (INDEPENDENT_AMBULATORY_CARE_PROVIDER_SITE_OTHER): Payer: Self-pay | Admitting: *Deleted

## 2012-10-05 DIAGNOSIS — Z7901 Long term (current) use of anticoagulants: Secondary | ICD-10-CM

## 2012-10-05 DIAGNOSIS — I4891 Unspecified atrial fibrillation: Secondary | ICD-10-CM

## 2012-10-12 ENCOUNTER — Telehealth: Payer: Self-pay | Admitting: Cardiology

## 2012-10-12 NOTE — Telephone Encounter (Signed)
Pt rtn call from last week to get her results and if you can't reach her on her home phone please call cell number thank you

## 2012-10-12 NOTE — Telephone Encounter (Signed)
Reviewed test results with pt who states understating.

## 2012-11-02 ENCOUNTER — Ambulatory Visit (INDEPENDENT_AMBULATORY_CARE_PROVIDER_SITE_OTHER): Payer: Self-pay | Admitting: Pharmacist

## 2012-11-02 DIAGNOSIS — Z7901 Long term (current) use of anticoagulants: Secondary | ICD-10-CM

## 2012-11-02 DIAGNOSIS — I4891 Unspecified atrial fibrillation: Secondary | ICD-10-CM

## 2012-11-30 ENCOUNTER — Ambulatory Visit (INDEPENDENT_AMBULATORY_CARE_PROVIDER_SITE_OTHER): Payer: Self-pay | Admitting: *Deleted

## 2012-11-30 DIAGNOSIS — Z7901 Long term (current) use of anticoagulants: Secondary | ICD-10-CM

## 2012-11-30 DIAGNOSIS — I4891 Unspecified atrial fibrillation: Secondary | ICD-10-CM

## 2012-11-30 LAB — POCT INR: INR: 2.7

## 2012-12-13 ENCOUNTER — Other Ambulatory Visit: Payer: Self-pay | Admitting: Cardiology

## 2012-12-31 ENCOUNTER — Telehealth: Payer: Self-pay | Admitting: Cardiology

## 2012-12-31 NOTE — Telephone Encounter (Signed)
TIKOSYN   250 MCG  REORDERED ORDER NUMBER  [id number]  ALLOW 7- 10  BUS DAYS FOR DELIVERY./CY

## 2012-12-31 NOTE — Telephone Encounter (Signed)
New problem   Meds was ordered from [employer] [phone] ID# [id number] and it is now time to reorder her Tikosyn 0.250mg . Please call pt if needed.

## 2013-01-04 ENCOUNTER — Telehealth: Payer: Self-pay | Admitting: *Deleted

## 2013-01-04 ENCOUNTER — Ambulatory Visit (INDEPENDENT_AMBULATORY_CARE_PROVIDER_SITE_OTHER): Payer: Self-pay | Admitting: *Deleted

## 2013-01-04 DIAGNOSIS — I4891 Unspecified atrial fibrillation: Secondary | ICD-10-CM

## 2013-01-04 DIAGNOSIS — Z7901 Long term (current) use of anticoagulants: Secondary | ICD-10-CM

## 2013-01-04 LAB — POCT INR: INR: 3

## 2013-01-04 NOTE — Telephone Encounter (Signed)
Received Tikosyn 0.250 mg X 60 in each of 3 bottles samples for pt.  Lot # [id number]  Exp 07/16/2014.  Left message samples at front desk.

## 2013-02-15 ENCOUNTER — Ambulatory Visit (INDEPENDENT_AMBULATORY_CARE_PROVIDER_SITE_OTHER): Payer: Self-pay | Admitting: *Deleted

## 2013-02-15 DIAGNOSIS — I4891 Unspecified atrial fibrillation: Secondary | ICD-10-CM

## 2013-02-15 DIAGNOSIS — Z7901 Long term (current) use of anticoagulants: Secondary | ICD-10-CM

## 2013-02-15 LAB — POCT INR: INR: 2.1

## 2013-03-29 ENCOUNTER — Ambulatory Visit (INDEPENDENT_AMBULATORY_CARE_PROVIDER_SITE_OTHER): Payer: Self-pay

## 2013-03-29 DIAGNOSIS — Z7901 Long term (current) use of anticoagulants: Secondary | ICD-10-CM

## 2013-03-29 DIAGNOSIS — I4891 Unspecified atrial fibrillation: Secondary | ICD-10-CM

## 2013-03-29 LAB — POCT INR: INR: 2.5

## 2013-04-02 ENCOUNTER — Telehealth: Payer: Self-pay | Admitting: *Deleted

## 2013-04-02 ENCOUNTER — Other Ambulatory Visit: Payer: Self-pay | Admitting: Cardiology

## 2013-04-02 ENCOUNTER — Other Ambulatory Visit: Payer: Self-pay | Admitting: *Deleted

## 2013-04-02 NOTE — Telephone Encounter (Signed)
Pt called to have Tikosyn 0.250 mg  Re-order. [employer] called medication was order for the next tree months order # [id number].  This enrollment expires December 23 rd 2014. Pt needs to re in roll in the assistant program prior this date.

## 2013-04-06 NOTE — Telephone Encounter (Signed)
noted 

## 2013-04-08 ENCOUNTER — Telehealth: Payer: Self-pay | Admitting: *Deleted

## 2013-04-08 NOTE — Telephone Encounter (Signed)
Left message for pt samples of Tikosyn here Lot Number [id number]   Exp 10/16/2014.  Paperwork included for pt to complete to continue to receive samples.  Samples at front desk.

## 2013-04-26 ENCOUNTER — Other Ambulatory Visit: Payer: Self-pay | Admitting: *Deleted

## 2013-04-26 ENCOUNTER — Telehealth: Payer: Self-pay | Admitting: Cardiology

## 2013-04-26 MED ORDER — WARFARIN SODIUM 5 MG PO TABS: 5.0000 mg | ORAL_TABLET | ORAL | Status: DC

## 2013-04-26 NOTE — Telephone Encounter (Signed)
Walk In pt Form " Envelope left For Pam will hold onto until pam comes back in Office on Tuesday  04/26/13/KM

## 2013-04-28 ENCOUNTER — Other Ambulatory Visit: Payer: Self-pay | Admitting: Cardiology

## 2013-05-10 ENCOUNTER — Ambulatory Visit (INDEPENDENT_AMBULATORY_CARE_PROVIDER_SITE_OTHER): Payer: Self-pay | Admitting: *Deleted

## 2013-05-10 DIAGNOSIS — Z7901 Long term (current) use of anticoagulants: Secondary | ICD-10-CM

## 2013-05-10 DIAGNOSIS — I4891 Unspecified atrial fibrillation: Secondary | ICD-10-CM

## 2013-06-01 ENCOUNTER — Telehealth: Payer: Self-pay | Admitting: Cardiology

## 2013-06-01 NOTE — Telephone Encounter (Signed)
Signed patient application form and signed physician application from [employer] and personal  information received, instructions to be reviewed prior to mailing packet. 06/01/13 rmf.

## 2013-06-02 NOTE — Telephone Encounter (Signed)
Patient application, information and signed healthcare provider application form mailed to [employer]. Copy of all documents mailed to patient. rmf 06/02/13.

## 2013-06-10 ENCOUNTER — Telehealth: Payer: Self-pay | Admitting: *Deleted

## 2013-06-10 NOTE — Telephone Encounter (Signed)
Left message for pt Tikosyn samples are in and at the front desk.  Requested she call back if questions or needs.

## 2013-06-21 ENCOUNTER — Ambulatory Visit (INDEPENDENT_AMBULATORY_CARE_PROVIDER_SITE_OTHER): Payer: Self-pay | Admitting: Pharmacist

## 2013-06-21 DIAGNOSIS — I4891 Unspecified atrial fibrillation: Secondary | ICD-10-CM

## 2013-06-21 DIAGNOSIS — Z7901 Long term (current) use of anticoagulants: Secondary | ICD-10-CM

## 2013-07-12 ENCOUNTER — Ambulatory Visit (INDEPENDENT_AMBULATORY_CARE_PROVIDER_SITE_OTHER): Payer: Self-pay | Admitting: General Practice

## 2013-07-12 DIAGNOSIS — I4891 Unspecified atrial fibrillation: Secondary | ICD-10-CM

## 2013-07-12 DIAGNOSIS — Z7901 Long term (current) use of anticoagulants: Secondary | ICD-10-CM

## 2013-07-12 LAB — POCT INR: INR: 3.4

## 2013-07-29 ENCOUNTER — Other Ambulatory Visit (HOSPITAL_COMMUNITY): Payer: Self-pay | Admitting: Family Medicine

## 2013-07-29 DIAGNOSIS — Z1231 Encounter for screening mammogram for malignant neoplasm of breast: Secondary | ICD-10-CM

## 2013-07-30 ENCOUNTER — Ambulatory Visit (HOSPITAL_COMMUNITY): Payer: BC Managed Care – PPO

## 2013-08-02 ENCOUNTER — Ambulatory Visit (INDEPENDENT_AMBULATORY_CARE_PROVIDER_SITE_OTHER): Payer: BC Managed Care – PPO | Admitting: *Deleted

## 2013-08-02 DIAGNOSIS — I4891 Unspecified atrial fibrillation: Secondary | ICD-10-CM

## 2013-08-02 DIAGNOSIS — Z7901 Long term (current) use of anticoagulants: Secondary | ICD-10-CM

## 2013-08-13 ENCOUNTER — Ambulatory Visit (HOSPITAL_COMMUNITY)
Admission: RE | Admit: 2013-08-13 | Discharge: 2013-08-13 | Disposition: A | Discharge status: Home / Self Care | Payer: BC Managed Care – PPO | Source: Ambulatory Visit | Attending: Family Medicine | Admitting: Family Medicine

## 2013-08-13 ENCOUNTER — Telehealth: Payer: Self-pay

## 2013-08-13 DIAGNOSIS — Z1231 Encounter for screening mammogram for malignant neoplasm of breast: Secondary | ICD-10-CM

## 2013-08-13 NOTE — Telephone Encounter (Signed)
called patient to let her know that her [name] was at the office

## 2013-08-30 ENCOUNTER — Telehealth: Payer: Self-pay | Admitting: *Deleted

## 2013-08-30 ENCOUNTER — Ambulatory Visit (INDEPENDENT_AMBULATORY_CARE_PROVIDER_SITE_OTHER): Payer: BC Managed Care – PPO | Admitting: General Practice

## 2013-08-30 DIAGNOSIS — Z7901 Long term (current) use of anticoagulants: Secondary | ICD-10-CM

## 2013-08-30 DIAGNOSIS — I4891 Unspecified atrial fibrillation: Secondary | ICD-10-CM

## 2013-08-30 NOTE — Telephone Encounter (Signed)
[facility] [facility] for Tikosyn capsules, shipped to office, during enrollment period [facility] will place order [phone]

## 2013-09-02 ENCOUNTER — Other Ambulatory Visit: Payer: Self-pay | Admitting: Cardiology

## 2013-09-06 ENCOUNTER — Other Ambulatory Visit: Payer: Self-pay | Admitting: *Deleted

## 2013-09-06 MED ORDER — WARFARIN SODIUM 5 MG PO TABS: 5.0000 mg | ORAL_TABLET | ORAL | Status: DC

## 2013-09-17 ENCOUNTER — Encounter: Payer: Self-pay | Admitting: Cardiology

## 2013-09-17 ENCOUNTER — Ambulatory Visit (INDEPENDENT_AMBULATORY_CARE_PROVIDER_SITE_OTHER): Payer: BC Managed Care – PPO | Admitting: Cardiology

## 2013-09-17 VITALS — BP 112/76 | HR 63 | Ht 63.0 in | Wt 199.1 lb

## 2013-09-17 DIAGNOSIS — Z79899 Other long term (current) drug therapy: Secondary | ICD-10-CM

## 2013-09-17 DIAGNOSIS — I4891 Unspecified atrial fibrillation: Secondary | ICD-10-CM

## 2013-09-17 LAB — CBC WITH DIFFERENTIAL/PLATELET
Basophils Absolute: 0 10*3/uL (ref 0.0–0.1)
Basophils Relative: 0.7 % (ref 0.0–3.0)
EOS PCT: 2.4 % (ref 0.0–5.0)
Eosinophils Absolute: 0.1 10*3/uL (ref 0.0–0.7)
HCT: 38.3 % (ref 36.0–46.0)
Hemoglobin: 12.2 g/dL (ref 12.0–15.0)
LYMPHS PCT: 26.1 % (ref 12.0–46.0)
Lymphs Abs: 1.3 10*3/uL (ref 0.7–4.0)
MCHC: 31.9 g/dL (ref 30.0–36.0)
MCV: 77.4 fl — ABNORMAL LOW (ref 78.0–100.0)
MONO ABS: 0.4 10*3/uL (ref 0.1–1.0)
Monocytes Relative: 8.1 % (ref 3.0–12.0)
NEUTROS PCT: 62.7 % (ref 43.0–77.0)
Neutro Abs: 3.1 10*3/uL (ref 1.4–7.7)
Platelets: 231 10*3/uL (ref 150.0–400.0)
RBC: 4.95 Mil/uL (ref 3.87–5.11)
RDW: 14.5 % (ref 11.5–14.6)
WBC: 4.9 10*3/uL (ref 4.5–10.5)

## 2013-09-17 LAB — BASIC METABOLIC PANEL
BUN: 18 mg/dL (ref 6–23)
CHLORIDE: 104 meq/L (ref 96–112)
CO2: 28 mEq/L (ref 19–32)
CREATININE: 0.9 mg/dL (ref 0.4–1.2)
Calcium: 9.2 mg/dL (ref 8.4–10.5)
GFR: 65.86 mL/min (ref >60.00)
Glucose, Bld: 105 mg/dL — ABNORMAL HIGH (ref 70–99)
Potassium: 4.1 mEq/L (ref 3.5–5.1)
Sodium: 138 mEq/L (ref 135–145)

## 2013-09-17 LAB — MAGNESIUM: MAGNESIUM: 1.9 mg/dL (ref 1.5–2.5)

## 2013-09-17 NOTE — Patient Instructions (Signed)
Your physician wants you to follow-up in:   [name] will receive a reminder letter in the mail two months in advance. If you don't receive a letter, please call our office to schedule the follow-up appointment. Your physician recommends that you continue on your current medications as directed. Please refer to the Current Medication list given to you today.  Your physician recommends that you return for lab work in:  TODAY   BMET CBC  AND  MAG

## 2013-09-17 NOTE — Progress Notes (Signed)
HPI The patient presents for followup of atrial fibrillation and hypertrophic cardiomyopathy. Since I last saw her she has done well.   She has some rare palpitations but no presyncope or syncope. She has no chest pressure, neck or arm discomfort.  With this she does not report any limitations. She's getting no new shortness of breath, PND or orthopnea. She has been gaining weight because she eats too much.    No Known Allergies  Current Outpatient Prescriptions  Medication Sig Dispense Refill  . CALCIUM PO Take 1 tablet by mouth 2 (two) times daily.        . cyclobenzaprine (FLEXERIL) 5 MG tablet Take 5 mg by mouth 3 (three) times daily as needed for muscle spasms.      Marland Kitchen dofetilide (TIKOSYN) 250 MCG capsule Take 1 capsule (250 mcg total) by mouth 2 (two) times daily.  180 capsule  3  . hydrocortisone 1 % lotion Apply 1 application topically daily. For dry skin on face.       Marland Kitchen KLOR-CON M20 20 MEQ tablet TAKE ONE  BY MOUTH EVERY DAY  30 tablet  6  . losartan (COZAAR) 50 MG tablet TAKE ONE TABLET BY MOUTH EVERY DAY  90 tablet  1  . metoprolol tartrate (LOPRESSOR) 25 MG tablet TAKE ONE TABLET BY MOUTH TWICE DAILY  60 tablet  6  . OVER THE COUNTER MEDICATION Apply 1 application topically daily as needed. "athletes foot cream"       . warfarin (COUMADIN) 5 MG tablet Take 1 tablet (5 mg total) by mouth as directed.  65 tablet  3   No current facility-administered medications for this visit.    Past Medical History  Diagnosis Date  . Hypertension   . Obesity   . Atrial fibrillation   . Atrial flutter   . Left ventricular hypertrophy     possibly hypertrophic cardiomyopathy    Past Surgical History  Procedure Laterality Date  . Abdominal hysterectomy    . Abdominal hysterectomy      ROS:  Cough and cold like symptoms.  As stated in the HPI and negative for all other systems.  PHYSICAL EXAM BP 112/76  Pulse 63  Ht 5\' 3"  (1.6 m)  Wt 199 lb 1.9 oz (90.32 kg)  BMI 35.28  kg/m2 GENERAL:  Well appearing NECK:   Positive jugular venous distention  At 45 at about 10 cm, waveform within normal limits, carotid upstroke brisk and symmetric, no bruits, no thyromegaly LUNGS:  Clear to auscultation bilaterally BACK:  No CVA tenderness CHEST:  Unremarkable HEART:  PMI not displaced or sustained,S1 and S2 within normal limits, no S3, no S4, no clicks, no rubs, slight apical systolic murmur not increasing with a strain phase of Valsalva murmurs ABD:  Flat, positive bowel sounds normal in frequency in pitch, no bruits, no rebound, no guarding, no midline pulsatile mass, no hepatomegaly, no splenomegaly EXT:  2 plus pulses throughout, no edema, no cyanosis no clubbing  EKG:  Normal sinus rhythm, rate 63, axis within normal limits, QTC shortened compared with previous, inferior and anterior deep T-wave inversions.  Unchanged from previous ECG.  09/17/2013  ASSESSMENT AND PLAN  Atrial fibrillation -  She has had no symptomatic paroxysms. She tolerates anticoagulation and does not want to change therapies. The patient is not interested in switching to a novel oral anticoagulant.  She is tolerating the Tikosyn.  No change in therapy is indicated.  I will check  Basic metabolic profile and  magnesium.  HYPERTENSION, BENIGN -  The blood pressure is at target. No change in medications is indicated. We will continue with therapeutic lifestyle changes (TLC).   HYPERTROPHIC CARDIOMYOPATHY  She has no new symptoms.  I will not repeat an echo this year but will likely do one again when I see her in one year.   Of note I do recognize that she has some pulmonary hypertension but she's not having any symptoms related to this.

## 2013-09-22 ENCOUNTER — Telehealth: Payer: Self-pay | Admitting: Cardiology

## 2013-09-22 NOTE — Telephone Encounter (Signed)
Follow Up ° °Pt returned call for results. Please call  °

## 2013-09-22 NOTE — Telephone Encounter (Signed)
Left message for pt of results - and to call back with any questions or concerns.

## 2013-09-23 ENCOUNTER — Telehealth: Payer: Self-pay | Admitting: Cardiology

## 2013-09-23 NOTE — Telephone Encounter (Signed)
Left message on pt's voicemail with lab results.

## 2013-09-23 NOTE — Telephone Encounter (Signed)
New Problem:  Pt states she is returning a call to the nurse.... Requesting a call back

## 2013-09-27 ENCOUNTER — Ambulatory Visit (INDEPENDENT_AMBULATORY_CARE_PROVIDER_SITE_OTHER): Payer: BC Managed Care – PPO | Admitting: *Deleted

## 2013-09-27 DIAGNOSIS — I4891 Unspecified atrial fibrillation: Secondary | ICD-10-CM

## 2013-09-27 DIAGNOSIS — Z7901 Long term (current) use of anticoagulants: Secondary | ICD-10-CM

## 2013-09-27 LAB — POCT INR: INR: 2.4

## 2013-10-24 ENCOUNTER — Other Ambulatory Visit: Payer: Self-pay | Admitting: Cardiology

## 2013-10-28 ENCOUNTER — Other Ambulatory Visit: Payer: Self-pay | Admitting: *Deleted

## 2013-10-28 MED ORDER — LOSARTAN POTASSIUM 50 MG PO TABS: ORAL_TABLET | ORAL | Status: DC

## 2013-11-08 ENCOUNTER — Ambulatory Visit (INDEPENDENT_AMBULATORY_CARE_PROVIDER_SITE_OTHER): Payer: BC Managed Care – PPO

## 2013-11-08 DIAGNOSIS — Z5181 Encounter for therapeutic drug level monitoring: Secondary | ICD-10-CM

## 2013-11-08 DIAGNOSIS — Z7901 Long term (current) use of anticoagulants: Secondary | ICD-10-CM

## 2013-11-08 DIAGNOSIS — I4891 Unspecified atrial fibrillation: Secondary | ICD-10-CM

## 2013-11-08 LAB — POCT INR: INR: 2.6

## 2013-12-20 ENCOUNTER — Ambulatory Visit (INDEPENDENT_AMBULATORY_CARE_PROVIDER_SITE_OTHER): Payer: BC Managed Care – PPO

## 2013-12-20 DIAGNOSIS — Z5181 Encounter for therapeutic drug level monitoring: Secondary | ICD-10-CM

## 2013-12-20 DIAGNOSIS — I4891 Unspecified atrial fibrillation: Secondary | ICD-10-CM

## 2013-12-20 DIAGNOSIS — Z7901 Long term (current) use of anticoagulants: Secondary | ICD-10-CM

## 2013-12-20 LAB — POCT INR: INR: 2.3

## 2013-12-31 ENCOUNTER — Telehealth: Payer: Self-pay | Admitting: *Deleted

## 2013-12-31 NOTE — Telephone Encounter (Signed)
Called tikosyn in to [place] for patient. Order #[id number].

## 2014-01-05 ENCOUNTER — Telehealth: Payer: Self-pay

## 2014-01-05 NOTE — Telephone Encounter (Signed)
Called patient to let her know that her tikosyn was here at the office 

## 2014-01-11 ENCOUNTER — Other Ambulatory Visit: Payer: Self-pay | Admitting: Cardiology

## 2014-01-28 ENCOUNTER — Ambulatory Visit (INDEPENDENT_AMBULATORY_CARE_PROVIDER_SITE_OTHER): Payer: BC Managed Care – PPO

## 2014-01-28 DIAGNOSIS — Z5181 Encounter for therapeutic drug level monitoring: Secondary | ICD-10-CM

## 2014-01-28 DIAGNOSIS — Z7901 Long term (current) use of anticoagulants: Secondary | ICD-10-CM

## 2014-01-28 DIAGNOSIS — I4891 Unspecified atrial fibrillation: Secondary | ICD-10-CM

## 2014-01-28 LAB — POCT INR: INR: 3

## 2014-02-14 ENCOUNTER — Encounter: Payer: Self-pay | Admitting: Cardiology

## 2014-03-11 ENCOUNTER — Ambulatory Visit (INDEPENDENT_AMBULATORY_CARE_PROVIDER_SITE_OTHER): Payer: BC Managed Care – PPO

## 2014-03-11 DIAGNOSIS — Z5181 Encounter for therapeutic drug level monitoring: Secondary | ICD-10-CM

## 2014-03-11 DIAGNOSIS — I4891 Unspecified atrial fibrillation: Secondary | ICD-10-CM

## 2014-03-11 DIAGNOSIS — Z7901 Long term (current) use of anticoagulants: Secondary | ICD-10-CM

## 2014-03-11 LAB — POCT INR: INR: 2.6

## 2014-04-05 ENCOUNTER — Telehealth: Payer: Self-pay

## 2014-04-05 NOTE — Telephone Encounter (Signed)
Pt called in needing tikosyn refilled Need refilled to [place] [facility] ID # [id number] REORDER # [phone]  Pt Home # [phone] Pt Cell # [phone]

## 2014-04-07 NOTE — Telephone Encounter (Signed)
Called and order tikosyn for patient . Order #[id number] ID # [phone]

## 2014-04-12 ENCOUNTER — Telehealth: Payer: Self-pay

## 2014-04-12 NOTE — Telephone Encounter (Signed)
Left message for patient to pick up [facility]

## 2014-04-18 ENCOUNTER — Other Ambulatory Visit: Payer: Self-pay | Admitting: Cardiology

## 2014-04-20 ENCOUNTER — Other Ambulatory Visit: Payer: Self-pay

## 2014-04-20 MED ORDER — LOSARTAN POTASSIUM 50 MG PO TABS: ORAL_TABLET | ORAL | Status: DC

## 2014-04-22 ENCOUNTER — Ambulatory Visit (INDEPENDENT_AMBULATORY_CARE_PROVIDER_SITE_OTHER): Payer: BC Managed Care – PPO | Admitting: *Deleted

## 2014-04-22 DIAGNOSIS — Z5181 Encounter for therapeutic drug level monitoring: Secondary | ICD-10-CM

## 2014-04-22 DIAGNOSIS — Z7901 Long term (current) use of anticoagulants: Secondary | ICD-10-CM

## 2014-04-22 DIAGNOSIS — I4891 Unspecified atrial fibrillation: Secondary | ICD-10-CM

## 2014-04-22 LAB — POCT INR: INR: 2.4

## 2014-05-25 ENCOUNTER — Other Ambulatory Visit: Payer: Self-pay | Admitting: *Deleted

## 2014-05-25 MED ORDER — WARFARIN SODIUM 5 MG PO TABS: ORAL_TABLET | ORAL | Status: DC

## 2014-06-03 ENCOUNTER — Ambulatory Visit (INDEPENDENT_AMBULATORY_CARE_PROVIDER_SITE_OTHER): Payer: BC Managed Care – PPO

## 2014-06-03 DIAGNOSIS — Z7901 Long term (current) use of anticoagulants: Secondary | ICD-10-CM

## 2014-06-03 DIAGNOSIS — Z5181 Encounter for therapeutic drug level monitoring: Secondary | ICD-10-CM

## 2014-06-03 DIAGNOSIS — I4891 Unspecified atrial fibrillation: Secondary | ICD-10-CM

## 2014-06-03 LAB — POCT INR: INR: 2.5

## 2014-07-01 NOTE — Telephone Encounter (Signed)
Nothing was typed/tmj 

## 2014-07-14 ENCOUNTER — Other Ambulatory Visit (HOSPITAL_COMMUNITY): Payer: Self-pay | Admitting: Family Medicine

## 2014-07-14 DIAGNOSIS — Z1231 Encounter for screening mammogram for malignant neoplasm of breast: Secondary | ICD-10-CM

## 2014-07-15 ENCOUNTER — Ambulatory Visit (INDEPENDENT_AMBULATORY_CARE_PROVIDER_SITE_OTHER): Payer: BC Managed Care – PPO | Admitting: *Deleted

## 2014-07-15 DIAGNOSIS — Z7901 Long term (current) use of anticoagulants: Secondary | ICD-10-CM

## 2014-07-15 DIAGNOSIS — Z5181 Encounter for therapeutic drug level monitoring: Secondary | ICD-10-CM

## 2014-07-15 DIAGNOSIS — I4891 Unspecified atrial fibrillation: Secondary | ICD-10-CM

## 2014-07-15 LAB — POCT INR: INR: 2.5

## 2014-07-21 ENCOUNTER — Other Ambulatory Visit: Payer: Self-pay | Admitting: *Deleted

## 2014-07-21 MED ORDER — DOFETILIDE 250 MCG PO CAPS: 250.0000 ug | ORAL_CAPSULE | Freq: Two times a day (BID) | ORAL | Status: DC

## 2014-07-22 ENCOUNTER — Telehealth: Payer: Self-pay

## 2014-07-22 ENCOUNTER — Telehealth: Payer: Self-pay | Admitting: Cardiology

## 2014-07-22 NOTE — Telephone Encounter (Signed)
I called and spoke with the pharmacist.  He realized that there is no generic for Tikosyn after he contacted our office.  I will attempt to contact the patient and discuss options.

## 2014-07-22 NOTE — Telephone Encounter (Signed)
Med assistance form faxed by Erasmo Downer, PharmD on 11/2. She has enough tikosyn to last tomorrow, Sunday and Monday AM - Erasmo Downer will contact Gay Filler, PharmD to obtain samples for patient to pick up at [facility] on Monday. Patient notified of this. Voiced understanding

## 2014-07-22 NOTE — Telephone Encounter (Signed)
New message      Talk to the nurse regarding tikosyn.  Have we completed the application for [employer]?

## 2014-07-22 NOTE — Telephone Encounter (Signed)
This message has been addressed in another encounter. Med assistance form faxed on 11/2 by Erasmo Downer, PharmD and patient was advised to contact [facility] office on Monday for samples of Tikosyn - to ask for [employer]

## 2014-07-22 NOTE — Telephone Encounter (Signed)
[name] is calling from [facility] to see if they can have permission to change Tikosyn 250 mg capsules and it is costing $400.31 to something that is more cost effective . Please call   Thanks

## 2014-07-25 ENCOUNTER — Telehealth: Payer: Self-pay | Admitting: Cardiology

## 2014-07-25 NOTE — Telephone Encounter (Signed)
New Message  Pt called states that her TIKOSYN order will not not get here in time. Will need samples as soon as possible.. Please call back to discuss

## 2014-07-25 NOTE — Telephone Encounter (Signed)
Tikosyn arrived this am, called patient, let her know to come to [facility] office to pick up medication.  Pt voiced understanding.

## 2014-08-19 ENCOUNTER — Ambulatory Visit (HOSPITAL_COMMUNITY): Payer: BC Managed Care – PPO

## 2014-08-26 ENCOUNTER — Ambulatory Visit (INDEPENDENT_AMBULATORY_CARE_PROVIDER_SITE_OTHER): Payer: BC Managed Care – PPO | Admitting: *Deleted

## 2014-08-26 DIAGNOSIS — Z5181 Encounter for therapeutic drug level monitoring: Secondary | ICD-10-CM

## 2014-08-26 DIAGNOSIS — Z7901 Long term (current) use of anticoagulants: Secondary | ICD-10-CM

## 2014-08-26 DIAGNOSIS — I4891 Unspecified atrial fibrillation: Secondary | ICD-10-CM

## 2014-08-26 LAB — POCT INR: INR: 2.3

## 2014-09-19 ENCOUNTER — Encounter: Payer: Self-pay | Admitting: Cardiology

## 2014-09-19 ENCOUNTER — Ambulatory Visit (INDEPENDENT_AMBULATORY_CARE_PROVIDER_SITE_OTHER): Payer: BLUE CROSS/BLUE SHIELD | Admitting: Cardiology

## 2014-09-19 VITALS — BP 180/80 | HR 60 | Ht 63.0 in | Wt 203.0 lb

## 2014-09-19 DIAGNOSIS — I4891 Unspecified atrial fibrillation: Secondary | ICD-10-CM

## 2014-09-19 DIAGNOSIS — E878 Other disorders of electrolyte and fluid balance, not elsewhere classified: Secondary | ICD-10-CM

## 2014-09-19 LAB — BASIC METABOLIC PANEL
BUN: 16 mg/dL (ref 6–23)
CALCIUM: 9.5 mg/dL (ref 8.4–10.5)
CO2: 26 mEq/L (ref 19–32)
Chloride: 104 mEq/L (ref 96–112)
Creat: 0.85 mg/dL (ref 0.50–1.10)
Glucose, Bld: 100 mg/dL — ABNORMAL HIGH (ref 70–99)
Potassium: 4.5 mEq/L (ref 3.5–5.3)
SODIUM: 138 meq/L (ref 135–145)

## 2014-09-19 LAB — MAGNESIUM: MAGNESIUM: 1.9 mg/dL (ref 1.5–2.5)

## 2014-09-19 NOTE — Progress Notes (Signed)
HPI The patient presents for followup of atrial fibrillation and hypertrophic cardiomyopathy. Since I last saw her she has done well.   She is not having palpitations and no presyncope or syncope. She has no chest pressure, neck or arm discomfort.  She's getting no new shortness of breath, PND or orthopnea. She has been gaining weight because she eats too much.  The patient denies any new symptoms such as chest discomfort, neck or arm discomfort. She is not being as active as I would like because she has to take care of her grandchild.   No Known Allergies  Current Outpatient Prescriptions  Medication Sig Dispense Refill  . CALCIUM PO Take 1 tablet by mouth 2 (two) times daily.      Marland Kitchen dofetilide (TIKOSYN) 250 MCG capsule Take 1 capsule (250 mcg total) by mouth 2 (two) times daily. 60 capsule 5  . hydrocortisone 1 % lotion Apply 1 application topically daily. For dry skin on face.     Marland Kitchen KLOR-CON M20 20 MEQ tablet TAKE ONE TABLET BY MOUTH ONCE DAILY 30 tablet 4  . losartan (COZAAR) 50 MG tablet TAKE ONE TABLET BY MOUTH EVERY DAY 30 tablet 5  . metoprolol tartrate (LOPRESSOR) 25 MG tablet TAKE ONE TABLET BY MOUTH TWICE DAILY 60 tablet 4  . OVER THE COUNTER MEDICATION Apply 1 application topically daily as needed. "athletes foot cream"     . warfarin (COUMADIN) 5 MG tablet Take as directed by Coumadin clinic 65 tablet 3   No current facility-administered medications for this visit.    Past Medical History  Diagnosis Date  . Hypertension   . Obesity   . Atrial fibrillation   . Atrial flutter   . Left ventricular hypertrophy     possibly hypertrophic cardiomyopathy    Past Surgical History  Procedure Laterality Date  . Abdominal hysterectomy    . Abdominal hysterectomy      ROS:  Hot flashes.  As stated in the HPI and negative for all other systems.  PHYSICAL EXAM BP 180/80 mmHg  Pulse 60  Ht 5\' 3"  (1.6 m)  Wt 203 lb (92.08 kg)  BMI 35.97 kg/m2 GENERAL:  Well appearing NECK:    Positive jugular venous distention  At 45 at about 10 cm, waveform within normal limits, carotid upstroke brisk and symmetric, no bruits, no thyromegaly LUNGS:  Clear to auscultation bilaterally BACK:  No CVA tenderness CHEST:  Unremarkable HEART:  PMI not displaced or sustained,S1 and S2 within normal limits, no S3, no S4, no clicks, no rubs, slight apical systolic murmur not increasing with a strain phase of Valsalva murmurs ABD:  Flat, positive bowel sounds normal in frequency in pitch, no bruits, no rebound, no guarding, no midline pulsatile mass, no hepatomegaly, no splenomegaly EXT:  2 plus pulses throughout, no edema, no cyanosis no clubbing  EKG:  Normal sinus rhythm, rate 60, axis within normal limits, QTC unchanged,  inferior and anterior deep T-wave inversions.  Unchanged from previous ECG.  09/19/2014  ASSESSMENT AND PLAN  Atrial fibrillation -  She has had no symptomatic paroxysms. She tolerates anticoagulation and does not want to change therapies. The patient is not interested in switching to a novel oral anticoagulant.  She is tolerating the Tikosyn.  No change in therapy is indicated.  I will check  Basic metabolic profile and magnesium.  HYPERTENSION, BENIGN -  The blood pressure iselevated.  However this is unusual.  I have instructed her to keep a BP diary.  Any change in therapy will be based on this.  HYPERTROPHIC CARDIOMYOPATHY  She has no new symptoms.  I will not repeat an echo this year.   Of note I do recognize that she has some pulmonary hypertension but she's not having any symptoms related to this.  I will follow all of this clinically.  OVERWEIGHT We talked about a 10 lb weight gain over 2 years.

## 2014-09-19 NOTE — Patient Instructions (Signed)
Your physician recommends that you schedule a follow-up appointment in: one year with Dr. Percival Spanish  We are ordering labs for you to get done

## 2014-09-23 ENCOUNTER — Other Ambulatory Visit: Payer: Self-pay | Admitting: Cardiology

## 2014-09-23 ENCOUNTER — Other Ambulatory Visit: Payer: Self-pay

## 2014-09-23 MED ORDER — WARFARIN SODIUM 5 MG PO TABS: ORAL_TABLET | ORAL | Status: DC

## 2014-09-23 MED ORDER — METOPROLOL TARTRATE 25 MG PO TABS: 25.0000 mg | ORAL_TABLET | Freq: Two times a day (BID) | ORAL | Status: DC

## 2014-09-23 MED ORDER — LOSARTAN POTASSIUM 50 MG PO TABS: ORAL_TABLET | ORAL | Status: DC

## 2014-09-23 MED ORDER — POTASSIUM CHLORIDE CRYS ER 20 MEQ PO TBCR: 20.0000 meq | EXTENDED_RELEASE_TABLET | Freq: Every day | ORAL | Status: DC

## 2014-09-23 NOTE — Telephone Encounter (Signed)
PT still have not received her medicine. Please call her Warfarin,Losartan,Klor-Con and Metoprolol  today to [phone].

## 2014-09-23 NOTE — Telephone Encounter (Signed)
Warfarin rx refill sent to [facility] as requested.

## 2014-09-26 ENCOUNTER — Telehealth: Payer: Self-pay | Admitting: Cardiology

## 2014-09-26 ENCOUNTER — Other Ambulatory Visit: Payer: Self-pay

## 2014-09-26 MED ORDER — METOPROLOL TARTRATE 25 MG PO TABS: 25.0000 mg | ORAL_TABLET | Freq: Two times a day (BID) | ORAL | Status: DC

## 2014-09-26 MED ORDER — POTASSIUM CHLORIDE CRYS ER 20 MEQ PO TBCR: 20.0000 meq | EXTENDED_RELEASE_TABLET | Freq: Every day | ORAL | Status: DC

## 2014-09-26 NOTE — Telephone Encounter (Signed)
Pt still have not received her Metoprolol and Klor-Con,called to the wrong pharmacy. Please call them to today to [facility].

## 2014-09-26 NOTE — Telephone Encounter (Signed)
Left message for patient to call, paperwork for the [place] assistance program for tikosyn faxed to [phone]

## 2014-09-27 NOTE — Telephone Encounter (Signed)
LM for patient that refills were sent in yesterday and that Tikosyn assistance forms were faxed yesterday as well.

## 2014-09-27 NOTE — Telephone Encounter (Signed)
Returning Debra call from yesterday. °

## 2014-10-04 ENCOUNTER — Other Ambulatory Visit: Payer: Self-pay | Admitting: *Deleted

## 2014-10-04 MED ORDER — MAGNESIUM 400 MG PO CAPS
400.0000 mg | ORAL_CAPSULE | Freq: Every day | ORAL | Status: DC
Start: 2014-10-04 — End: 2014-10-04

## 2014-10-04 MED ORDER — MAGNESIUM 400 MG PO TABS: 400.0000 mg | ORAL_TABLET | Freq: Every day | ORAL | Status: DC

## 2014-10-14 ENCOUNTER — Ambulatory Visit (INDEPENDENT_AMBULATORY_CARE_PROVIDER_SITE_OTHER): Payer: BLUE CROSS/BLUE SHIELD | Admitting: *Deleted

## 2014-10-14 DIAGNOSIS — I4891 Unspecified atrial fibrillation: Secondary | ICD-10-CM

## 2014-10-14 DIAGNOSIS — Z7901 Long term (current) use of anticoagulants: Secondary | ICD-10-CM

## 2014-10-14 DIAGNOSIS — Z5181 Encounter for therapeutic drug level monitoring: Secondary | ICD-10-CM

## 2014-10-14 LAB — POCT INR: INR: 3.1

## 2014-10-21 ENCOUNTER — Telehealth: Payer: Self-pay | Admitting: Cardiology

## 2014-10-21 MED ORDER — LOSARTAN POTASSIUM 50 MG PO TABS: ORAL_TABLET | ORAL | Status: DC

## 2014-10-21 NOTE — Telephone Encounter (Signed)
°  1. Which medications need to be refilled? Losartan 50mg    2. Which pharmacy is medication to be sent to? [facility] on [address]//needs a new prescription sent   3. Do they need a 30 day or 90 day supply? 30  4. Would they like a call back once the medication has been sent to the pharmacy? Yes

## 2014-10-21 NOTE — Telephone Encounter (Signed)
Refill submitted to patient's preferred pharmacy. Left message on machine patient.

## 2014-11-25 ENCOUNTER — Ambulatory Visit (INDEPENDENT_AMBULATORY_CARE_PROVIDER_SITE_OTHER): Payer: BLUE CROSS/BLUE SHIELD

## 2014-11-25 DIAGNOSIS — Z7901 Long term (current) use of anticoagulants: Secondary | ICD-10-CM

## 2014-11-25 DIAGNOSIS — I4891 Unspecified atrial fibrillation: Secondary | ICD-10-CM

## 2014-11-25 DIAGNOSIS — Z5181 Encounter for therapeutic drug level monitoring: Secondary | ICD-10-CM

## 2014-11-25 LAB — POCT INR: INR: 2.6

## 2014-12-23 ENCOUNTER — Telehealth: Payer: Self-pay | Admitting: Cardiology

## 2014-12-23 NOTE — Telephone Encounter (Signed)
Pt says she need her Tikosyn. She get this through [employer] program. Her CY#[id number] .Pt need 3 bottles of this with 60 each in each bottle.Please call her when this is called in.

## 2014-12-23 NOTE — Telephone Encounter (Signed)
Tikosyn ordered, arrive in 7-10 days

## 2015-01-13 ENCOUNTER — Ambulatory Visit (INDEPENDENT_AMBULATORY_CARE_PROVIDER_SITE_OTHER): Payer: BLUE CROSS/BLUE SHIELD | Admitting: *Deleted

## 2015-01-13 DIAGNOSIS — I4891 Unspecified atrial fibrillation: Secondary | ICD-10-CM

## 2015-01-13 DIAGNOSIS — Z7901 Long term (current) use of anticoagulants: Secondary | ICD-10-CM | POA: Diagnosis not present

## 2015-01-13 DIAGNOSIS — Z5181 Encounter for therapeutic drug level monitoring: Secondary | ICD-10-CM

## 2015-01-13 LAB — POCT INR: INR: 3.1

## 2015-01-24 ENCOUNTER — Other Ambulatory Visit: Payer: Self-pay | Admitting: Cardiology

## 2015-02-24 ENCOUNTER — Ambulatory Visit (INDEPENDENT_AMBULATORY_CARE_PROVIDER_SITE_OTHER): Payer: BLUE CROSS/BLUE SHIELD | Admitting: *Deleted

## 2015-02-24 DIAGNOSIS — Z5181 Encounter for therapeutic drug level monitoring: Secondary | ICD-10-CM | POA: Diagnosis not present

## 2015-02-24 DIAGNOSIS — Z7901 Long term (current) use of anticoagulants: Secondary | ICD-10-CM

## 2015-02-24 DIAGNOSIS — I4891 Unspecified atrial fibrillation: Secondary | ICD-10-CM

## 2015-02-24 LAB — POCT INR: INR: 2

## 2015-04-07 ENCOUNTER — Ambulatory Visit (INDEPENDENT_AMBULATORY_CARE_PROVIDER_SITE_OTHER): Payer: BLUE CROSS/BLUE SHIELD | Admitting: *Deleted

## 2015-04-07 DIAGNOSIS — Z5181 Encounter for therapeutic drug level monitoring: Secondary | ICD-10-CM | POA: Diagnosis not present

## 2015-04-07 DIAGNOSIS — I4891 Unspecified atrial fibrillation: Secondary | ICD-10-CM

## 2015-04-07 DIAGNOSIS — Z7901 Long term (current) use of anticoagulants: Secondary | ICD-10-CM

## 2015-04-07 LAB — POCT INR: INR: 2.1

## 2015-05-17 ENCOUNTER — Other Ambulatory Visit: Payer: Self-pay | Admitting: Cardiology

## 2015-05-19 ENCOUNTER — Ambulatory Visit (INDEPENDENT_AMBULATORY_CARE_PROVIDER_SITE_OTHER): Payer: BLUE CROSS/BLUE SHIELD | Admitting: *Deleted

## 2015-05-19 DIAGNOSIS — Z5181 Encounter for therapeutic drug level monitoring: Secondary | ICD-10-CM

## 2015-05-19 DIAGNOSIS — I4891 Unspecified atrial fibrillation: Secondary | ICD-10-CM | POA: Diagnosis not present

## 2015-05-19 DIAGNOSIS — Z7901 Long term (current) use of anticoagulants: Secondary | ICD-10-CM | POA: Diagnosis not present

## 2015-05-19 LAB — POCT INR: INR: 2.5

## 2015-06-16 ENCOUNTER — Telehealth: Payer: Self-pay | Admitting: Cardiology

## 2015-06-16 NOTE — Telephone Encounter (Signed)
Needs dofetilide (TIKOSYN) 250 MCG capsule ID# [id number]

## 2015-06-16 NOTE — Telephone Encounter (Signed)
Ordered from [employer]

## 2015-06-29 ENCOUNTER — Ambulatory Visit (INDEPENDENT_AMBULATORY_CARE_PROVIDER_SITE_OTHER): Payer: BLUE CROSS/BLUE SHIELD | Admitting: *Deleted

## 2015-06-29 DIAGNOSIS — I4891 Unspecified atrial fibrillation: Secondary | ICD-10-CM | POA: Diagnosis not present

## 2015-06-29 DIAGNOSIS — Z7901 Long term (current) use of anticoagulants: Secondary | ICD-10-CM

## 2015-06-29 DIAGNOSIS — Z5181 Encounter for therapeutic drug level monitoring: Secondary | ICD-10-CM | POA: Diagnosis not present

## 2015-06-29 LAB — POCT INR: INR: 2.1

## 2015-07-24 ENCOUNTER — Telehealth: Payer: Self-pay | Admitting: Pharmacist Clinician (PhC)/ Clinical Pharmacy Specialist

## 2015-07-24 NOTE — Telephone Encounter (Signed)
Please call her final refill for Tikosyn---patient is on the medication assistance program for this medication.

## 2015-07-26 NOTE — Telephone Encounter (Signed)
Reordered Tikosyn, not due to be shipped until Dec 3.  LMOM for patient

## 2015-08-08 ENCOUNTER — Ambulatory Visit (INDEPENDENT_AMBULATORY_CARE_PROVIDER_SITE_OTHER): Payer: BLUE CROSS/BLUE SHIELD | Admitting: *Deleted

## 2015-08-08 DIAGNOSIS — Z7901 Long term (current) use of anticoagulants: Secondary | ICD-10-CM | POA: Diagnosis not present

## 2015-08-08 DIAGNOSIS — Z5181 Encounter for therapeutic drug level monitoring: Secondary | ICD-10-CM | POA: Diagnosis not present

## 2015-08-08 DIAGNOSIS — I4891 Unspecified atrial fibrillation: Secondary | ICD-10-CM

## 2015-08-08 LAB — POCT INR: INR: 2.4

## 2015-08-14 ENCOUNTER — Other Ambulatory Visit: Payer: Self-pay | Admitting: Cardiology

## 2015-08-14 NOTE — Telephone Encounter (Signed)
Rx request sent to pharmacy.  

## 2015-08-21 ENCOUNTER — Telehealth: Payer: Self-pay | Admitting: Pharmacist Clinician (PhC)/ Clinical Pharmacy Specialist

## 2015-08-21 NOTE — Telephone Encounter (Signed)
Patient states it is time to reorder her Tikosyn.

## 2015-08-23 NOTE — Telephone Encounter (Signed)
Tikosyn arrived, patient aware

## 2015-09-15 ENCOUNTER — Other Ambulatory Visit: Payer: Self-pay | Admitting: Cardiology

## 2015-09-19 ENCOUNTER — Ambulatory Visit (INDEPENDENT_AMBULATORY_CARE_PROVIDER_SITE_OTHER): Payer: 59 | Admitting: Surgery

## 2015-09-19 DIAGNOSIS — Z5181 Encounter for therapeutic drug level monitoring: Secondary | ICD-10-CM

## 2015-09-19 DIAGNOSIS — I4891 Unspecified atrial fibrillation: Secondary | ICD-10-CM | POA: Diagnosis not present

## 2015-09-19 DIAGNOSIS — Z7901 Long term (current) use of anticoagulants: Secondary | ICD-10-CM | POA: Diagnosis not present

## 2015-09-19 LAB — POCT INR: INR: 1.6

## 2015-09-21 ENCOUNTER — Telehealth: Payer: Self-pay | Admitting: Cardiology

## 2015-09-21 MED ORDER — WARFARIN SODIUM 5 MG PO TABS: ORAL_TABLET | ORAL | Status: DC

## 2015-09-21 NOTE — Telephone Encounter (Signed)
Pt states when she went to pick up her Coumadin refill was only 6 tablets given . Will e scribe refill on her coumadin today of 65 tablets and 3 refills and she states understanding

## 2015-09-21 NOTE — Telephone Encounter (Signed)
Pt calling inquiring about the amount of warfarin medication given. Pt stated that the amount was wrong and would like for someone to give her a call back, ([phone] or (h) [phone], concerning this matter. Please advise

## 2015-09-28 ENCOUNTER — Ambulatory Visit: Payer: BLUE CROSS/BLUE SHIELD | Admitting: Cardiology

## 2015-10-03 ENCOUNTER — Ambulatory Visit (INDEPENDENT_AMBULATORY_CARE_PROVIDER_SITE_OTHER): Payer: 59 | Admitting: *Deleted

## 2015-10-03 DIAGNOSIS — Z7901 Long term (current) use of anticoagulants: Secondary | ICD-10-CM

## 2015-10-03 DIAGNOSIS — I4891 Unspecified atrial fibrillation: Secondary | ICD-10-CM

## 2015-10-03 DIAGNOSIS — Z5181 Encounter for therapeutic drug level monitoring: Secondary | ICD-10-CM | POA: Diagnosis not present

## 2015-10-03 LAB — POCT INR: INR: 2.1

## 2015-10-11 ENCOUNTER — Encounter: Payer: Self-pay | Admitting: *Deleted

## 2015-10-17 ENCOUNTER — Other Ambulatory Visit: Payer: Self-pay | Admitting: Cardiology

## 2015-10-23 ENCOUNTER — Ambulatory Visit (INDEPENDENT_AMBULATORY_CARE_PROVIDER_SITE_OTHER): Payer: 59 | Admitting: Pharmacist Clinician (PhC)/ Clinical Pharmacy Specialist

## 2015-10-23 ENCOUNTER — Encounter: Payer: Self-pay | Admitting: Cardiology

## 2015-10-23 ENCOUNTER — Ambulatory Visit (INDEPENDENT_AMBULATORY_CARE_PROVIDER_SITE_OTHER): Payer: 59 | Admitting: Cardiology

## 2015-10-23 VITALS — BP 148/90 | HR 62 | Ht 63.0 in | Wt 203.1 lb

## 2015-10-23 DIAGNOSIS — Z5181 Encounter for therapeutic drug level monitoring: Secondary | ICD-10-CM | POA: Diagnosis not present

## 2015-10-23 DIAGNOSIS — Z7901 Long term (current) use of anticoagulants: Secondary | ICD-10-CM | POA: Diagnosis not present

## 2015-10-23 DIAGNOSIS — I4891 Unspecified atrial fibrillation: Secondary | ICD-10-CM | POA: Diagnosis not present

## 2015-10-23 DIAGNOSIS — I519 Heart disease, unspecified: Secondary | ICD-10-CM

## 2015-10-23 LAB — BASIC METABOLIC PANEL
BUN: 17 mg/dL (ref 7–25)
CO2: 26 mmol/L (ref 20–31)
CREATININE: 0.89 mg/dL (ref 0.50–0.99)
Calcium: 9.3 mg/dL (ref 8.6–10.4)
Chloride: 106 mmol/L (ref 98–110)
Glucose, Bld: 99 mg/dL (ref 65–99)
Potassium: 4.6 mmol/L (ref 3.5–5.3)
Sodium: 139 mmol/L (ref 135–146)

## 2015-10-23 LAB — MAGNESIUM: MAGNESIUM: 1.9 mg/dL (ref 1.5–2.5)

## 2015-10-23 LAB — POCT INR: INR: 2.2

## 2015-10-23 NOTE — Progress Notes (Signed)
HPI The patient presents for followup of atrial fibrillation and hypertrophic cardiomyopathy. Since I last saw her she has done well.   She is not having palpitations and no presyncope or syncope. She has no chest pressure, neck or arm discomfort.  She's getting no new shortness of breath, PND or orthopnea.   The patient denies any new symptoms such as chest discomfort, neck or arm discomfort. She has gained a little weight but she joined the [employer].    No Known Allergies  Current Outpatient Prescriptions  Medication Sig Dispense Refill  . CALCIUM PO Take 1 tablet by mouth 2 (two) times daily.      Marland Kitchen dofetilide (TIKOSYN) 250 MCG capsule Take 1 capsule (250 mcg total) by mouth 2 (two) times daily. 60 capsule 5  . hydrocortisone 1 % lotion Apply 1 application topically daily. For dry skin on face.     Marland Kitchen KLOR-CON M20 20 MEQ tablet TAKE ONE TABLET BY MOUTH ONCE DAILY 30 tablet 3  . losartan (COZAAR) 50 MG tablet TAKE ONE TABLET BY MOUTH ONCE DAILY 30 tablet 1  . Magnesium 400 MG TABS Take 400 mg by mouth daily. 90 tablet 3  . metoprolol tartrate (LOPRESSOR) 25 MG tablet TAKE ONE TABLET BY MOUTH TWICE DAILY 60 tablet 2  . OVER THE COUNTER MEDICATION Apply 1 application topically daily as needed. "athletes foot cream"     . warfarin (COUMADIN) 5 MG tablet Take as directed by coumadin clinic 65 tablet 3   No current facility-administered medications for this visit.    Past Medical History  Diagnosis Date  . Hypertension   . Obesity   . Atrial fibrillation ([place])   . Atrial flutter ([place])   . Left ventricular hypertrophy     possibly hypertrophic cardiomyopathy    Past Surgical History  Procedure Laterality Date  . Abdominal hysterectomy    . Abdominal hysterectomy      ROS:  Hot flashes.  As stated in the HPI and negative for all other systems.  PHYSICAL EXAM BP 148/90 mmHg  Pulse 62  Ht 5\' 3"  (1.6 m)  Wt 203 lb 1.6 oz (92.126 kg)  BMI 35.99 kg/m2 GENERAL:  Well appearing NECK:    No JVD, carotid upstroke brisk and symmetric, no bruits, no thyromegaly LUNGS:  Clear to auscultation bilaterally BACK:  No CVA tenderness CHEST:  Unremarkable HEART:  PMI not displaced or sustained,S1 and S2 within normal limits, no S3, no S4, no clicks, no rubs, slight apical systolic murmur not increasing with a strain phase of Valsalva murmurs ABD:  Flat, positive bowel sounds normal in frequency in pitch, no bruits, no rebound, no guarding, no midline pulsatile mass, no hepatomegaly, no splenomegaly EXT:  2 plus pulses throughout, no edema, no cyanosis no clubbing  EKG:  Normal sinus rhythm, rate 62, axis within normal limits, QTC unchanged,  inferior and anterior deep T-wave inversions.  Unchanged from previous ECG.  10/23/2015  ASSESSMENT AND PLAN  Atrial fibrillation -  She has had rare symptomatic paroxysms. She tolerates anticoagulation and does not want to change therapies. The patient is not interested in switching to a novel oral anticoagulant.  She is tolerating the Tikosyn.  No change in therapy is indicated.  I will check  Basic metabolic profile and magnesium today.  Ms. Jonel Whipp has a CHA2DS2 - VASc score of 2 with a risk of stroke of 2%.  HYPERTENSION, BENIGN -  The blood pressure is elevated.  She will keep a  BP diary.    HYPERTROPHIC CARDIOMYOPATHY  She has no new symptoms.  It has been three years and I will check an echocardiogram.   OVERWEIGHT We talked about this and she has a plan to lose weight.

## 2015-10-23 NOTE — Patient Instructions (Signed)
Medication Instructions:  Your physician recommends that you continue on your current medications as directed. Please refer to the Current Medication list given to you today.   Labwork: Your physician recommends that you return for lab work in: TODAY (bmet/mg) The lab can be found on the FIRST FLOOR of out building in [address]   Testing/Procedures: Your physician has requested that you have an echocardiogram. Echocardiography is a painless test that uses sound waves to create images of your heart. It provides your doctor with information about the size and shape of your heart and how well your heart's chambers and valves are working. This procedure takes approximately one hour. There are no restrictions for this procedure.    Follow-Up: Your physician wants you to follow-up in: 6 months with Dr. Percival Spanish. You will receive a reminder letter in the mail two months in advance. If you don't receive a letter, please call our office to schedule the follow-up appointment.   Any Other Special Instructions Will Be Listed Below (If Applicable).     If you need a refill on your cardiac medications before your next appointment, please call your pharmacy.

## 2015-11-06 ENCOUNTER — Telehealth: Payer: Self-pay | Admitting: Cardiology

## 2015-11-06 MED ORDER — MAGNESIUM 400 MG PO TABS: 800.0000 mg | ORAL_TABLET | Freq: Every day | ORAL | Status: DC

## 2015-11-06 NOTE — Telephone Encounter (Signed)
Follow up ° ° ° ° ° °Returning a call to the nurse °

## 2015-11-06 NOTE — Telephone Encounter (Signed)
LMTCB - lab results Kelli Churn, RN LM x2 for patient to return call

## 2015-11-06 NOTE — Telephone Encounter (Signed)
Returning your call from last week. °

## 2015-11-06 NOTE — Telephone Encounter (Signed)
Lm to cb to discuss labs and the need to start MG.

## 2015-11-06 NOTE — Telephone Encounter (Signed)
Returning call from last week °

## 2015-11-06 NOTE — Telephone Encounter (Signed)
REVIEWED lab results and to increase MG to 800 mg a day.  She states understanding.

## 2015-11-07 ENCOUNTER — Other Ambulatory Visit (HOSPITAL_COMMUNITY): Payer: 59

## 2015-11-15 ENCOUNTER — Other Ambulatory Visit: Payer: Self-pay | Admitting: *Deleted

## 2015-11-16 ENCOUNTER — Other Ambulatory Visit: Payer: Self-pay | Admitting: *Deleted

## 2015-11-16 MED ORDER — WARFARIN SODIUM 5 MG PO TABS: ORAL_TABLET | ORAL | Status: DC

## 2015-11-16 MED ORDER — LOSARTAN POTASSIUM 50 MG PO TABS: 50.0000 mg | ORAL_TABLET | Freq: Every day | ORAL | Status: DC

## 2015-11-20 ENCOUNTER — Telehealth: Payer: Self-pay

## 2015-11-20 NOTE — Telephone Encounter (Signed)
Pt would like all further refills to be sent to [facility]. [facility] her Metoprolol and Klor con.

## 2015-12-05 ENCOUNTER — Ambulatory Visit (HOSPITAL_COMMUNITY): Payer: 59 | Attending: Cardiovascular Disease

## 2015-12-05 ENCOUNTER — Other Ambulatory Visit: Payer: Self-pay

## 2015-12-05 ENCOUNTER — Ambulatory Visit (INDEPENDENT_AMBULATORY_CARE_PROVIDER_SITE_OTHER): Payer: 59 | Admitting: *Deleted

## 2015-12-05 DIAGNOSIS — I519 Heart disease, unspecified: Secondary | ICD-10-CM

## 2015-12-05 DIAGNOSIS — I071 Rheumatic tricuspid insufficiency: Secondary | ICD-10-CM | POA: Insufficient documentation

## 2015-12-05 DIAGNOSIS — E669 Obesity, unspecified: Secondary | ICD-10-CM | POA: Insufficient documentation

## 2015-12-05 DIAGNOSIS — I351 Nonrheumatic aortic (valve) insufficiency: Secondary | ICD-10-CM | POA: Diagnosis not present

## 2015-12-05 DIAGNOSIS — I119 Hypertensive heart disease without heart failure: Secondary | ICD-10-CM | POA: Diagnosis not present

## 2015-12-05 DIAGNOSIS — Z87891 Personal history of nicotine dependence: Secondary | ICD-10-CM | POA: Insufficient documentation

## 2015-12-05 DIAGNOSIS — Z5181 Encounter for therapeutic drug level monitoring: Secondary | ICD-10-CM

## 2015-12-05 DIAGNOSIS — Z7901 Long term (current) use of anticoagulants: Secondary | ICD-10-CM | POA: Diagnosis not present

## 2015-12-05 DIAGNOSIS — Z6836 Body mass index (BMI) 36.0-36.9, adult: Secondary | ICD-10-CM | POA: Diagnosis not present

## 2015-12-05 DIAGNOSIS — I4891 Unspecified atrial fibrillation: Secondary | ICD-10-CM | POA: Diagnosis not present

## 2015-12-05 LAB — POCT INR: INR: 3.1

## 2015-12-12 ENCOUNTER — Telehealth: Payer: Self-pay | Admitting: Cardiology

## 2015-12-12 NOTE — Telephone Encounter (Signed)
Returning call,she does not know who it was.

## 2015-12-13 NOTE — Telephone Encounter (Signed)
Spoke with pt about her test

## 2015-12-15 ENCOUNTER — Telehealth: Payer: Self-pay | Admitting: *Deleted

## 2015-12-15 NOTE — Telephone Encounter (Signed)
Patient called and stated that she needs a reorder of the [place] from [employer]. The call number that she provided was [phone] and patient id [phone]. Thanks, MI

## 2015-12-15 NOTE — Telephone Encounter (Signed)
LM for patient to call back to see if she gets Tikosyn from drug company (?)

## 2015-12-20 NOTE — Telephone Encounter (Signed)
Spoke with patient. She states [name] usually helps arrange this. She called and left her a VM on Monday 4/3 Message routed to [facility] for follow up.

## 2015-12-21 NOTE — Telephone Encounter (Signed)
LMOM for patient, Tikosyn arrived, can pick up at front desk

## 2016-01-14 ENCOUNTER — Other Ambulatory Visit: Payer: Self-pay | Admitting: Cardiology

## 2016-01-16 ENCOUNTER — Ambulatory Visit (INDEPENDENT_AMBULATORY_CARE_PROVIDER_SITE_OTHER): Payer: 59

## 2016-01-16 DIAGNOSIS — I4891 Unspecified atrial fibrillation: Secondary | ICD-10-CM

## 2016-01-16 DIAGNOSIS — Z7901 Long term (current) use of anticoagulants: Secondary | ICD-10-CM

## 2016-01-16 DIAGNOSIS — Z5181 Encounter for therapeutic drug level monitoring: Secondary | ICD-10-CM

## 2016-01-16 LAB — POCT INR: INR: 2.5

## 2016-02-14 ENCOUNTER — Other Ambulatory Visit: Payer: Self-pay | Admitting: Cardiology

## 2016-02-14 NOTE — Telephone Encounter (Signed)
Rx(s) sent to pharmacy electronically.  

## 2016-02-16 ENCOUNTER — Other Ambulatory Visit: Payer: Self-pay | Admitting: Cardiology

## 2016-02-27 ENCOUNTER — Ambulatory Visit (INDEPENDENT_AMBULATORY_CARE_PROVIDER_SITE_OTHER): Payer: 59 | Admitting: *Deleted

## 2016-02-27 DIAGNOSIS — Z7901 Long term (current) use of anticoagulants: Secondary | ICD-10-CM | POA: Diagnosis not present

## 2016-02-27 DIAGNOSIS — Z5181 Encounter for therapeutic drug level monitoring: Secondary | ICD-10-CM

## 2016-02-27 DIAGNOSIS — I4891 Unspecified atrial fibrillation: Secondary | ICD-10-CM

## 2016-02-27 LAB — POCT INR: INR: 3

## 2016-03-20 ENCOUNTER — Telehealth: Payer: Self-pay | Admitting: Cardiology

## 2016-03-20 NOTE — Telephone Encounter (Signed)
Tikosyn ordered Ref # [id number]

## 2016-03-20 NOTE — Telephone Encounter (Signed)
New message      Calling to ask [name] to order her [name]

## 2016-03-22 ENCOUNTER — Telehealth: Payer: Self-pay | Admitting: Pharmacist

## 2016-03-22 NOTE — Telephone Encounter (Signed)
LM that [name] is ready for pick up at front desk.

## 2016-04-09 ENCOUNTER — Ambulatory Visit (INDEPENDENT_AMBULATORY_CARE_PROVIDER_SITE_OTHER): Payer: 59 | Admitting: *Deleted

## 2016-04-09 ENCOUNTER — Encounter: Payer: Self-pay | Admitting: *Deleted

## 2016-04-09 DIAGNOSIS — Z5181 Encounter for therapeutic drug level monitoring: Secondary | ICD-10-CM | POA: Diagnosis not present

## 2016-04-09 DIAGNOSIS — I4891 Unspecified atrial fibrillation: Secondary | ICD-10-CM | POA: Diagnosis not present

## 2016-04-09 DIAGNOSIS — Z7901 Long term (current) use of anticoagulants: Secondary | ICD-10-CM

## 2016-04-09 LAB — POCT INR: INR: 2.2

## 2016-05-21 ENCOUNTER — Ambulatory Visit (INDEPENDENT_AMBULATORY_CARE_PROVIDER_SITE_OTHER): Payer: 59 | Admitting: *Deleted

## 2016-05-21 DIAGNOSIS — Z7901 Long term (current) use of anticoagulants: Secondary | ICD-10-CM | POA: Diagnosis not present

## 2016-05-21 DIAGNOSIS — Z5181 Encounter for therapeutic drug level monitoring: Secondary | ICD-10-CM | POA: Diagnosis not present

## 2016-05-21 DIAGNOSIS — I4891 Unspecified atrial fibrillation: Secondary | ICD-10-CM | POA: Diagnosis not present

## 2016-05-21 LAB — POCT INR: INR: 2.7

## 2016-06-09 ENCOUNTER — Other Ambulatory Visit: Payer: Self-pay | Admitting: Cardiology

## 2016-06-10 ENCOUNTER — Other Ambulatory Visit: Payer: Self-pay | Admitting: Cardiology

## 2016-06-12 ENCOUNTER — Telehealth: Payer: Self-pay | Admitting: Cardiology

## 2016-06-12 NOTE — Telephone Encounter (Signed)
Forward to  [place] --

## 2016-06-12 NOTE — Telephone Encounter (Signed)
Medication ordered.  Will contact patient once it arrives

## 2016-06-12 NOTE — Telephone Encounter (Signed)
New message    Pt calling for Erasmo Downer to order her tikosyn.

## 2016-06-14 NOTE — Telephone Encounter (Signed)
Tikosyn arrived 250 mcg x 3 bottles

## 2016-07-02 ENCOUNTER — Ambulatory Visit (INDEPENDENT_AMBULATORY_CARE_PROVIDER_SITE_OTHER): Payer: 59 | Admitting: *Deleted

## 2016-07-02 ENCOUNTER — Encounter: Payer: Self-pay | Admitting: *Deleted

## 2016-07-02 DIAGNOSIS — Z7901 Long term (current) use of anticoagulants: Secondary | ICD-10-CM

## 2016-07-02 DIAGNOSIS — I4891 Unspecified atrial fibrillation: Secondary | ICD-10-CM | POA: Diagnosis not present

## 2016-07-02 DIAGNOSIS — Z5181 Encounter for therapeutic drug level monitoring: Secondary | ICD-10-CM | POA: Diagnosis not present

## 2016-07-02 LAB — POCT INR: INR: 2.8

## 2016-07-10 ENCOUNTER — Other Ambulatory Visit: Payer: Self-pay | Admitting: Cardiology

## 2016-07-11 ENCOUNTER — Other Ambulatory Visit: Payer: Self-pay | Admitting: *Deleted

## 2016-07-11 MED ORDER — POTASSIUM CHLORIDE CRYS ER 20 MEQ PO TBCR: 20.0000 meq | EXTENDED_RELEASE_TABLET | Freq: Every day | ORAL | 2 refills | Status: DC

## 2016-07-11 MED ORDER — WARFARIN SODIUM 5 MG PO TABS: ORAL_TABLET | ORAL | 3 refills | Status: DC

## 2016-08-14 ENCOUNTER — Encounter: Payer: Self-pay | Admitting: *Deleted

## 2016-08-14 ENCOUNTER — Ambulatory Visit (INDEPENDENT_AMBULATORY_CARE_PROVIDER_SITE_OTHER): Payer: 59 | Admitting: *Deleted

## 2016-08-14 DIAGNOSIS — Z7901 Long term (current) use of anticoagulants: Secondary | ICD-10-CM

## 2016-08-14 DIAGNOSIS — Z5181 Encounter for therapeutic drug level monitoring: Secondary | ICD-10-CM

## 2016-08-14 DIAGNOSIS — I4891 Unspecified atrial fibrillation: Secondary | ICD-10-CM

## 2016-08-14 LAB — POCT INR: INR: 2.6

## 2016-08-26 ENCOUNTER — Telehealth: Payer: Self-pay | Admitting: Pharmacist

## 2016-08-26 NOTE — Telephone Encounter (Signed)
Placed order to [place] Patient asistance today 08/26/16 @ 4:57pm Ref # [id number]  Tikosyn 250mg  x 60 tabs #3

## 2016-09-25 ENCOUNTER — Encounter (INDEPENDENT_AMBULATORY_CARE_PROVIDER_SITE_OTHER): Payer: Self-pay

## 2016-09-25 ENCOUNTER — Ambulatory Visit (INDEPENDENT_AMBULATORY_CARE_PROVIDER_SITE_OTHER): Payer: 59 | Admitting: *Deleted

## 2016-09-25 ENCOUNTER — Encounter: Payer: Self-pay | Admitting: *Deleted

## 2016-09-25 DIAGNOSIS — I4891 Unspecified atrial fibrillation: Secondary | ICD-10-CM | POA: Diagnosis not present

## 2016-09-25 DIAGNOSIS — Z5181 Encounter for therapeutic drug level monitoring: Secondary | ICD-10-CM | POA: Diagnosis not present

## 2016-09-25 DIAGNOSIS — Z7901 Long term (current) use of anticoagulants: Secondary | ICD-10-CM | POA: Diagnosis not present

## 2016-09-25 LAB — POCT INR: INR: 2.5

## 2016-10-08 ENCOUNTER — Other Ambulatory Visit: Payer: Self-pay

## 2016-10-08 MED ORDER — POTASSIUM CHLORIDE CRYS ER 20 MEQ PO TBCR: 20.0000 meq | EXTENDED_RELEASE_TABLET | Freq: Every day | ORAL | 1 refills | Status: DC

## 2016-10-08 NOTE — Telephone Encounter (Signed)
Rx(s) sent to pharmacy electronically.  

## 2016-11-06 ENCOUNTER — Ambulatory Visit (INDEPENDENT_AMBULATORY_CARE_PROVIDER_SITE_OTHER): Payer: PRIVATE HEALTH INSURANCE | Admitting: Pharmacist

## 2016-11-06 ENCOUNTER — Encounter (INDEPENDENT_AMBULATORY_CARE_PROVIDER_SITE_OTHER): Payer: Self-pay

## 2016-11-06 DIAGNOSIS — I4891 Unspecified atrial fibrillation: Secondary | ICD-10-CM | POA: Diagnosis not present

## 2016-11-06 DIAGNOSIS — Z7901 Long term (current) use of anticoagulants: Secondary | ICD-10-CM | POA: Diagnosis not present

## 2016-11-06 DIAGNOSIS — Z5181 Encounter for therapeutic drug level monitoring: Secondary | ICD-10-CM

## 2016-11-06 LAB — POCT INR: INR: 3

## 2016-11-13 ENCOUNTER — Other Ambulatory Visit: Payer: Self-pay

## 2016-11-13 MED ORDER — LOSARTAN POTASSIUM 50 MG PO TABS: 50.0000 mg | ORAL_TABLET | Freq: Every day | ORAL | 0 refills | Status: DC

## 2016-11-14 ENCOUNTER — Other Ambulatory Visit: Payer: Self-pay | Admitting: *Deleted

## 2016-11-14 ENCOUNTER — Other Ambulatory Visit: Payer: Self-pay | Admitting: Pharmacist Clinician (PhC)/ Clinical Pharmacy Specialist

## 2016-11-14 MED ORDER — WARFARIN SODIUM 5 MG PO TABS: ORAL_TABLET | ORAL | 3 refills | Status: DC

## 2016-12-01 ENCOUNTER — Other Ambulatory Visit: Payer: Self-pay | Admitting: Cardiology

## 2016-12-03 NOTE — Telephone Encounter (Signed)
Rx(s) sent to pharmacy electronically.  

## 2016-12-10 ENCOUNTER — Other Ambulatory Visit: Payer: Self-pay | Admitting: Cardiology

## 2016-12-17 ENCOUNTER — Other Ambulatory Visit: Payer: Self-pay | Admitting: Cardiology

## 2016-12-17 NOTE — Telephone Encounter (Signed)
Rx request sent to pharmacy.  

## 2016-12-18 ENCOUNTER — Ambulatory Visit (INDEPENDENT_AMBULATORY_CARE_PROVIDER_SITE_OTHER): Payer: PRIVATE HEALTH INSURANCE | Admitting: *Deleted

## 2016-12-18 ENCOUNTER — Encounter (INDEPENDENT_AMBULATORY_CARE_PROVIDER_SITE_OTHER): Payer: Self-pay

## 2016-12-18 DIAGNOSIS — Z7901 Long term (current) use of anticoagulants: Secondary | ICD-10-CM

## 2016-12-18 DIAGNOSIS — Z5181 Encounter for therapeutic drug level monitoring: Secondary | ICD-10-CM

## 2016-12-18 DIAGNOSIS — I4891 Unspecified atrial fibrillation: Secondary | ICD-10-CM

## 2016-12-18 LAB — POCT INR: INR: 3.2

## 2016-12-26 ENCOUNTER — Other Ambulatory Visit: Payer: Self-pay | Admitting: Cardiology

## 2016-12-26 NOTE — Telephone Encounter (Signed)
NEEDS OV 

## 2017-01-09 ENCOUNTER — Ambulatory Visit (INDEPENDENT_AMBULATORY_CARE_PROVIDER_SITE_OTHER): Payer: PRIVATE HEALTH INSURANCE | Admitting: *Deleted

## 2017-01-09 DIAGNOSIS — I4891 Unspecified atrial fibrillation: Secondary | ICD-10-CM

## 2017-01-09 DIAGNOSIS — Z5181 Encounter for therapeutic drug level monitoring: Secondary | ICD-10-CM | POA: Diagnosis not present

## 2017-01-09 DIAGNOSIS — Z7901 Long term (current) use of anticoagulants: Secondary | ICD-10-CM

## 2017-01-09 LAB — POCT INR: INR: 3.3

## 2017-01-09 MED ORDER — DOFETILIDE 250 MCG PO CAPS: 250.0000 ug | ORAL_CAPSULE | Freq: Two times a day (BID) | ORAL | 1 refills | Status: DC

## 2017-01-10 ENCOUNTER — Other Ambulatory Visit: Payer: Self-pay | Admitting: Cardiology

## 2017-01-13 NOTE — Telephone Encounter (Signed)
REFILL 

## 2017-01-20 NOTE — Progress Notes (Signed)
HPI The patient presents for followup of atrial fibrillation and hypertrophic cardiomyopathy. Since I last saw her she is done relatively well. She's had some knee problems and hasn't been exercising as much as I would like. Her weight is up a little bit. Blood pressure is up a little bit and it has been the last few times. She's not had any new shortness of breath, PND or orthopnea. He's had no new chest pressure, neck or arm discomfort. She's had no weight gain or edema. She joined the [employer] but she doesn't go.   No Known Allergies  Current Outpatient Prescriptions  Medication Sig Dispense Refill  . CALCIUM PO Take 1 tablet by mouth 2 (two) times daily.      Marland Kitchen dofetilide (TIKOSYN) 250 MCG capsule Take 1 capsule (250 mcg total) by mouth 2 (two) times daily. 60 capsule 1  . hydrocortisone 1 % lotion Apply 1 application topically daily. For dry skin on face.     Marland Kitchen losartan (COZAAR) 50 MG tablet Take 1.5 tablets (75 mg total) by mouth daily. 135 tablet 1  . magnesium oxide (MAGNESIUM-OXIDE) 400 (241.3 Mg) MG tablet Take 2 tablets (800 mg total) by mouth daily. 180 tablet 1  . metoprolol tartrate (LOPRESSOR) 25 MG tablet Take 1 tablet (25 mg total) by mouth 2 (two) times daily. 180 tablet 1  . OVER THE COUNTER MEDICATION Apply 1 application topically daily as needed. "athletes foot cream"     . potassium chloride SA (K-DUR,KLOR-CON) 20 MEQ tablet Take 1 tablet (20 mEq total) by mouth daily. 90 tablet 1  . warfarin (COUMADIN) 5 MG tablet Take 2 tablets by mouth daily or as directed by coumadin clinic 65 tablet 3   No current facility-administered medications for this visit.     Past Medical History:  Diagnosis Date  . Atrial fibrillation ([place])   . Atrial flutter ([place])   . Hypertension   . Left ventricular hypertrophy    possibly hypertrophic cardiomyopathy  . Obesity     Past Surgical History:  Procedure Laterality Date  . ABDOMINAL HYSTERECTOMY      ROS:  Left leg, knee pain,  allergies.  As stated in the HPI and negative for all other systems.  PHYSICAL EXAM BP (!) 150/86 (BP Location: Left Arm, Patient Position: Sitting, Cuff Size: Normal)   Pulse 63   Ht 5\' 3"  (1.6 m)   Wt 207 lb 3.2 oz (94 kg)   SpO2 93%   BMI 36.70 kg/m  GENERAL:  Well appearing NECK:   Positive JVD, carotid upstroke brisk and symmetric, no bruits, no thyromegaly LUNGS:  Clear to auscultation bilaterally BACK:  No CVA tenderness CHEST:  Unremarkable HEART:  PMI not displaced or sustained,S1 and S2 within normal limits, no S3, no S4, no clicks, no rubs, slight apical systolic murmur not increasing with a strain phase of Valsalva. Unchanged from previous exam.   ABD:  Flat, positive bowel sounds normal in frequency in pitch, no bruits, no rebound, no guarding, no midline pulsatile mass, no hepatomegaly, no splenomegaly EXT:  2 plus pulses throughout, no edema, no cyanosis no clubbing  EKG:  Normal sinus rhythm, rate 63, axis within normal limits, QTC is slightly prolonged,  inferior and anterior deep T-wave inversions.  Unchanged from previous ECG.  01/22/2017  ASSESSMENT AND PLAN  Atrial fibrillation -  She has had no significant arrhythmias.  She tolerates anticoagulation and does not want to change therapies.   She is tolerating the Tikosyn. The  QT is slightly prolonged but not as elevated as suggested on the computer reading. I will keep a close eye on this. I'll check a magnesium and basic metabolic profile today. I'll repeat an EKG when she comes back and might need to reduce the dose.  [name] has a CHA2DS2 - VASc score of 2 with a risk of stroke of 2%.  HYPERTENSION, BENIGN -  The blood pressure is  elevated.  I will increase the Cozaar to add 25 mg dose every night.  HYPERTROPHIC CARDIOMYOPATHY  She had moderate pulmonary hypertension last year and going to repeat an echocardiogram.  OVERWEIGHT  We talked about this and she has a plan to lose weight.

## 2017-01-21 ENCOUNTER — Encounter (INDEPENDENT_AMBULATORY_CARE_PROVIDER_SITE_OTHER): Payer: Self-pay

## 2017-01-21 ENCOUNTER — Ambulatory Visit (INDEPENDENT_AMBULATORY_CARE_PROVIDER_SITE_OTHER): Payer: PRIVATE HEALTH INSURANCE | Admitting: Cardiology

## 2017-01-21 ENCOUNTER — Ambulatory Visit (INDEPENDENT_AMBULATORY_CARE_PROVIDER_SITE_OTHER): Payer: PRIVATE HEALTH INSURANCE | Admitting: *Deleted

## 2017-01-21 ENCOUNTER — Encounter: Payer: Self-pay | Admitting: Cardiology

## 2017-01-21 VITALS — BP 150/86 | HR 63 | Ht 63.0 in | Wt 207.2 lb

## 2017-01-21 DIAGNOSIS — I10 Essential (primary) hypertension: Secondary | ICD-10-CM | POA: Diagnosis not present

## 2017-01-21 DIAGNOSIS — I270 Primary pulmonary hypertension: Secondary | ICD-10-CM

## 2017-01-21 DIAGNOSIS — Z7901 Long term (current) use of anticoagulants: Secondary | ICD-10-CM

## 2017-01-21 DIAGNOSIS — I421 Obstructive hypertrophic cardiomyopathy: Secondary | ICD-10-CM

## 2017-01-21 DIAGNOSIS — I4891 Unspecified atrial fibrillation: Secondary | ICD-10-CM | POA: Diagnosis not present

## 2017-01-21 DIAGNOSIS — Z5181 Encounter for therapeutic drug level monitoring: Secondary | ICD-10-CM | POA: Diagnosis not present

## 2017-01-21 DIAGNOSIS — Z79899 Other long term (current) drug therapy: Secondary | ICD-10-CM

## 2017-01-21 LAB — POCT INR: INR: 3.2

## 2017-01-21 MED ORDER — POTASSIUM CHLORIDE CRYS ER 20 MEQ PO TBCR: 20.0000 meq | EXTENDED_RELEASE_TABLET | Freq: Every day | ORAL | 1 refills | Status: DC

## 2017-01-21 MED ORDER — METOPROLOL TARTRATE 25 MG PO TABS: 25.0000 mg | ORAL_TABLET | Freq: Two times a day (BID) | ORAL | 1 refills | Status: DC

## 2017-01-21 MED ORDER — LOSARTAN POTASSIUM 50 MG PO TABS: 75.0000 mg | ORAL_TABLET | Freq: Every day | ORAL | 1 refills | Status: DC

## 2017-01-21 MED ORDER — MAGNESIUM OXIDE 400 (241.3 MG) MG PO TABS: 2.0000 | ORAL_TABLET | Freq: Every day | ORAL | 1 refills | Status: DC

## 2017-01-21 NOTE — Patient Instructions (Addendum)
Medication Instructions:   INCREASE cozaar (Losartan) to 75mg  DAILY.  Labwork:  BMET, Magnesium at [place] (non-fasting bloodwork)   Testing/Procedures:  Your physician has requested that you have an echocardiogram. Echocardiography is a painless test that uses sound waves to create images of your heart. It provides your doctor with information about the size and shape of your heart and how well your heart's chambers and valves are working. This procedure takes approximately one hour. There are no restrictions for this procedure.     Follow-Up: 1 month with Dr. Percival Spanish (following echocardiogram)    If you need a refill on your cardiac medications before your next appointment, please call your pharmacy.

## 2017-01-22 ENCOUNTER — Telehealth: Payer: Self-pay | Admitting: *Deleted

## 2017-01-22 ENCOUNTER — Encounter: Payer: Self-pay | Admitting: Cardiology

## 2017-01-22 DIAGNOSIS — I270 Primary pulmonary hypertension: Secondary | ICD-10-CM | POA: Insufficient documentation

## 2017-01-22 NOTE — Telephone Encounter (Signed)
Left message for patient to call and reschedule 02/28/17 appt with Dr. Percival Spanish

## 2017-02-04 ENCOUNTER — Ambulatory Visit (HOSPITAL_COMMUNITY): Payer: No Typology Code available for payment source | Attending: Cardiovascular Disease

## 2017-02-04 ENCOUNTER — Ambulatory Visit (INDEPENDENT_AMBULATORY_CARE_PROVIDER_SITE_OTHER): Payer: PRIVATE HEALTH INSURANCE | Admitting: *Deleted

## 2017-02-04 ENCOUNTER — Other Ambulatory Visit: Payer: Self-pay

## 2017-02-04 DIAGNOSIS — I517 Cardiomegaly: Secondary | ICD-10-CM | POA: Insufficient documentation

## 2017-02-04 DIAGNOSIS — Z5181 Encounter for therapeutic drug level monitoring: Secondary | ICD-10-CM

## 2017-02-04 DIAGNOSIS — I4891 Unspecified atrial fibrillation: Secondary | ICD-10-CM | POA: Diagnosis not present

## 2017-02-04 DIAGNOSIS — E669 Obesity, unspecified: Secondary | ICD-10-CM | POA: Insufficient documentation

## 2017-02-04 DIAGNOSIS — Z7901 Long term (current) use of anticoagulants: Secondary | ICD-10-CM

## 2017-02-04 DIAGNOSIS — I361 Nonrheumatic tricuspid (valve) insufficiency: Secondary | ICD-10-CM | POA: Insufficient documentation

## 2017-02-04 DIAGNOSIS — Z6836 Body mass index (BMI) 36.0-36.9, adult: Secondary | ICD-10-CM | POA: Insufficient documentation

## 2017-02-04 DIAGNOSIS — I351 Nonrheumatic aortic (valve) insufficiency: Secondary | ICD-10-CM | POA: Insufficient documentation

## 2017-02-04 DIAGNOSIS — I270 Primary pulmonary hypertension: Secondary | ICD-10-CM

## 2017-02-04 DIAGNOSIS — Z87891 Personal history of nicotine dependence: Secondary | ICD-10-CM | POA: Insufficient documentation

## 2017-02-04 LAB — POCT INR: INR: 2.5

## 2017-02-05 LAB — BASIC METABOLIC PANEL
BUN / CREAT RATIO: 16 (ref 12–28)
BUN: 14 mg/dL (ref 8–27)
CALCIUM: 9.8 mg/dL (ref 8.7–10.3)
CHLORIDE: 102 mmol/L (ref 96–106)
CO2: 25 mmol/L (ref 18–29)
Creatinine, Ser: 0.88 mg/dL (ref 0.57–1.00)
GFR, EST AFRICAN AMERICAN: 80 mL/min/{1.73_m2} (ref >59)
GFR, EST NON AFRICAN AMERICAN: 70 mL/min/{1.73_m2} (ref >59)
Glucose: 120 mg/dL — ABNORMAL HIGH (ref 65–99)
POTASSIUM: 4.9 mmol/L (ref 3.5–5.2)
Sodium: 141 mmol/L (ref 134–144)

## 2017-02-05 LAB — MAGNESIUM: Magnesium: 2.1 mg/dL (ref 1.6–2.3)

## 2017-02-11 ENCOUNTER — Telehealth: Payer: Self-pay | Admitting: *Deleted

## 2017-02-11 NOTE — Telephone Encounter (Signed)
Left message to call back for lab results.   Notes recorded by Minus Breeding, MD on 02/08/2017 at 1:19 PM EDT Labs OK. Call Ms. Cogswell with the results and send results to [place], L.Marlou Sa, MD

## 2017-02-12 ENCOUNTER — Encounter: Payer: Self-pay | Admitting: Cardiology

## 2017-02-25 ENCOUNTER — Ambulatory Visit (INDEPENDENT_AMBULATORY_CARE_PROVIDER_SITE_OTHER): Payer: PRIVATE HEALTH INSURANCE | Admitting: *Deleted

## 2017-02-25 DIAGNOSIS — Z7901 Long term (current) use of anticoagulants: Secondary | ICD-10-CM

## 2017-02-25 DIAGNOSIS — Z5181 Encounter for therapeutic drug level monitoring: Secondary | ICD-10-CM

## 2017-02-25 DIAGNOSIS — I4891 Unspecified atrial fibrillation: Secondary | ICD-10-CM | POA: Diagnosis not present

## 2017-02-25 LAB — POCT INR: INR: 2.4

## 2017-02-28 ENCOUNTER — Ambulatory Visit: Payer: PRIVATE HEALTH INSURANCE | Admitting: Cardiology

## 2017-03-02 NOTE — Progress Notes (Signed)
HPI The patient presents for followup of atrial fibrillation and hypertrophic cardiomyopathy.  At the last visit her BP was increased and I increased her Cozaar.  A follow up echo demonstrated concentric LVH unchanged and pulmonary pressure was lower than previous.  Since I last saw her she has done well.  The patient denies any new symptoms such as chest discomfort, neck or arm discomfort. There has been no new shortness of breath, PND or orthopnea. There have been no reported palpitations, presyncope or syncope.  She is not as active as I would like.  She has had a [employer].  She bought work out clothes.  Her next stop is to buy shoes.  She does do some walking at the mall.    No Known Allergies  Current Outpatient Prescriptions  Medication Sig Dispense Refill  . CALCIUM PO Take 1 tablet by mouth 2 (two) times daily.      Marland Kitchen dofetilide (TIKOSYN) 250 MCG capsule Take 1 capsule (250 mcg total) by mouth 2 (two) times daily. 60 capsule 1  . hydrocortisone 1 % lotion Apply 1 application topically daily. For dry skin on face.     Marland Kitchen losartan (COZAAR) 50 MG tablet Take 1.5 tablets (75 mg total) by mouth daily. 135 tablet 1  . magnesium oxide (MAGNESIUM-OXIDE) 400 (241.3 Mg) MG tablet Take 2 tablets (800 mg total) by mouth daily. 180 tablet 1  . metoprolol tartrate (LOPRESSOR) 25 MG tablet Take 1 tablet (25 mg total) by mouth 2 (two) times daily. 180 tablet 1  . OVER THE COUNTER MEDICATION Apply 1 application topically daily as needed. "athletes foot cream"     . potassium chloride SA (K-DUR,KLOR-CON) 20 MEQ tablet Take 1 tablet (20 mEq total) by mouth daily. 90 tablet 1  . warfarin (COUMADIN) 5 MG tablet Take 2 tablets by mouth daily or as directed by coumadin clinic 65 tablet 3   No current facility-administered medications for this visit.     Past Medical History:  Diagnosis Date  . Atrial fibrillation ([place])   . Atrial flutter ([place])   . Hypertension   . Left ventricular hypertrophy    possibly hypertrophic cardiomyopathy  . Obesity     Past Surgical History:  Procedure Laterality Date  . ABDOMINAL HYSTERECTOMY      ROS:  As stated in the HPI and negative for all other systems.  PHYSICAL EXAM BP (!) 146/82 (BP Location: Left Arm, Patient Position: Sitting, Cuff Size: Large)   Pulse 63   Ht 5\' 3"  (1.6 m)   Wt 208 lb 9.6 oz (94.6 kg)   BMI 36.95 kg/m   GENERAL:  Well appearing NECK:  No jugular venous distention, waveform within normal limits, carotid upstroke brisk and symmetric, no bruits, no thyromegaly LUNGS:  Clear to auscultation bilaterally BACK:  No CVA tenderness CHEST:  Unremarkable HEART:  PMI not displaced or sustained,S1 and S2 within normal limits, no S3, no S4, no clicks, no rubs, Slight apical systolic murmur noted increasing with the strain phase of Valsalva, no diastolic murmurs ABD:  Flat, positive bowel sounds normal in frequency in pitch, no bruits, no rebound, no guarding, no midline pulsatile mass, no hepatomegaly, no splenomegaly EXT:  2 plus pulses throughout, mild left greater than right leg edema, no cyanosis no clubbing   EKG:  Normal sinus rhythm, rate 63, axis within normal limits, QTC is slightly prolonged but reduced from previous,  inferior and anterior deep T-wave inversions. PACs, otherwise from previous ECG.  03/03/2017  ASSESSMENT AND PLAN  Atrial fibrillation -   She is tolerating the Tikosyn. The QT is reduced from previous   Ms. Tyriana Hawn has a CHA2DS2 - VASc score of 2 with a risk of stroke of 2%.  Mag and potassium were OK 3 weeks ago.   She will remain on the meds as listed  HYPERTENSION, BENIGN -  The blood pressure is borderline. I have suggested 5 pounds of weight loss and would likely avoid any further med changes.  HYPERTROPHIC CARDIOMYOPATHY  There was no change on echo last month except that pulmonary pressures were not as elevated as previous.  I will follow this up with repeat exams.  echocardiogram.  OVERWEIGHT We have discussed this again today.

## 2017-03-03 ENCOUNTER — Ambulatory Visit (INDEPENDENT_AMBULATORY_CARE_PROVIDER_SITE_OTHER): Payer: PRIVATE HEALTH INSURANCE | Admitting: Cardiology

## 2017-03-03 ENCOUNTER — Encounter: Payer: Self-pay | Admitting: Cardiology

## 2017-03-03 VITALS — BP 146/82 | HR 63 | Ht 63.0 in | Wt 208.6 lb

## 2017-03-03 DIAGNOSIS — I10 Essential (primary) hypertension: Secondary | ICD-10-CM

## 2017-03-03 DIAGNOSIS — I270 Primary pulmonary hypertension: Secondary | ICD-10-CM | POA: Diagnosis not present

## 2017-03-03 DIAGNOSIS — I481 Persistent atrial fibrillation: Secondary | ICD-10-CM

## 2017-03-03 DIAGNOSIS — I4819 Other persistent atrial fibrillation: Secondary | ICD-10-CM

## 2017-03-03 NOTE — Patient Instructions (Signed)
Medication Instructions:  Continue current medications  Labwork: None ordered  Testing/Procedures: None Ordered  Follow-Up: Your physician wants you to follow-up in: 6 Months. You will receive a reminder letter in the mail two months in advance. If you don't receive a letter, please call our office to schedule the follow-up appointment.   Any Other Special Instructions Will Be Listed Below (If Applicable).   If you need a refill on your cardiac medications before your next appointment, please call your pharmacy.

## 2017-03-08 ENCOUNTER — Other Ambulatory Visit: Payer: Self-pay | Admitting: Cardiology

## 2017-03-10 NOTE — Telephone Encounter (Signed)
Rx has been sent to the pharmacy electronically. ° °

## 2017-03-11 ENCOUNTER — Other Ambulatory Visit: Payer: Self-pay | Admitting: Pharmacist

## 2017-03-11 MED ORDER — WARFARIN SODIUM 5 MG PO TABS: ORAL_TABLET | ORAL | 2 refills | Status: DC

## 2017-03-24 ENCOUNTER — Telehealth: Payer: Self-pay | Admitting: Pharmacist

## 2017-03-24 NOTE — Telephone Encounter (Signed)
[employer] Tikosyn 0.25mg  patient assistance was approved.  Patient instructed to call [phone] to place order for refill once last bottle open.

## 2017-03-24 NOTE — Telephone Encounter (Signed)
Next due for tikosyn order July/21

## 2017-03-25 ENCOUNTER — Ambulatory Visit (INDEPENDENT_AMBULATORY_CARE_PROVIDER_SITE_OTHER): Payer: PRIVATE HEALTH INSURANCE | Admitting: *Deleted

## 2017-03-25 ENCOUNTER — Encounter: Payer: Self-pay | Admitting: *Deleted

## 2017-03-25 DIAGNOSIS — Z5181 Encounter for therapeutic drug level monitoring: Secondary | ICD-10-CM

## 2017-03-25 DIAGNOSIS — Z7901 Long term (current) use of anticoagulants: Secondary | ICD-10-CM

## 2017-03-25 DIAGNOSIS — I4891 Unspecified atrial fibrillation: Secondary | ICD-10-CM

## 2017-03-25 LAB — POCT INR: INR: 2.1

## 2017-04-29 ENCOUNTER — Ambulatory Visit (INDEPENDENT_AMBULATORY_CARE_PROVIDER_SITE_OTHER): Payer: PPO

## 2017-04-29 DIAGNOSIS — Z7901 Long term (current) use of anticoagulants: Secondary | ICD-10-CM

## 2017-04-29 DIAGNOSIS — I4891 Unspecified atrial fibrillation: Secondary | ICD-10-CM | POA: Diagnosis not present

## 2017-04-29 DIAGNOSIS — Z5181 Encounter for therapeutic drug level monitoring: Secondary | ICD-10-CM | POA: Diagnosis not present

## 2017-04-29 LAB — POCT INR: INR: 2.1

## 2017-06-10 ENCOUNTER — Ambulatory Visit (INDEPENDENT_AMBULATORY_CARE_PROVIDER_SITE_OTHER): Payer: PPO | Admitting: *Deleted

## 2017-06-10 ENCOUNTER — Telehealth: Payer: Self-pay | Admitting: Cardiology

## 2017-06-10 ENCOUNTER — Other Ambulatory Visit: Payer: Self-pay | Admitting: Cardiology

## 2017-06-10 ENCOUNTER — Encounter (INDEPENDENT_AMBULATORY_CARE_PROVIDER_SITE_OTHER): Payer: Self-pay

## 2017-06-10 DIAGNOSIS — Z7901 Long term (current) use of anticoagulants: Secondary | ICD-10-CM | POA: Diagnosis not present

## 2017-06-10 DIAGNOSIS — I4891 Unspecified atrial fibrillation: Secondary | ICD-10-CM | POA: Diagnosis not present

## 2017-06-10 DIAGNOSIS — Z5181 Encounter for therapeutic drug level monitoring: Secondary | ICD-10-CM

## 2017-06-10 LAB — POCT INR: INR: 2

## 2017-06-10 NOTE — Telephone Encounter (Signed)
Medication ordered

## 2017-06-10 NOTE — Telephone Encounter (Signed)
New Message    *STAT* If patient is at the pharmacy, call can be transferred to refill team.   1. Which medications need to be refilled? (please list name of each medication and dose if known) Tikosyn 0.25mg    2. Which pharmacy/location (including street and city if local pharmacy) is medication to be sent to? Per pt its a discount pharmacy she uses  3. Do they need a 30 day or 90 day supply? Per pt she gets the amount that comes with the discount plan.

## 2017-06-10 NOTE — Telephone Encounter (Signed)
Lm2cb-need to know the name of the pharmacy to send medication to

## 2017-06-10 NOTE — Telephone Encounter (Signed)
Follow up      Please call in presc for tikosyn to [facility] [facility].  She gets pt assistance.

## 2017-07-21 ENCOUNTER — Other Ambulatory Visit: Payer: Self-pay | Admitting: *Deleted

## 2017-07-21 MED ORDER — MAGNESIUM OXIDE 400 (241.3 MG) MG PO TABS: 2.0000 | ORAL_TABLET | Freq: Every day | ORAL | 1 refills | Status: DC

## 2017-08-12 ENCOUNTER — Ambulatory Visit (INDEPENDENT_AMBULATORY_CARE_PROVIDER_SITE_OTHER): Payer: PPO

## 2017-08-12 DIAGNOSIS — Z7901 Long term (current) use of anticoagulants: Secondary | ICD-10-CM

## 2017-08-12 DIAGNOSIS — I4891 Unspecified atrial fibrillation: Secondary | ICD-10-CM

## 2017-08-12 DIAGNOSIS — Z5181 Encounter for therapeutic drug level monitoring: Secondary | ICD-10-CM | POA: Diagnosis not present

## 2017-08-12 LAB — POCT INR: INR: 1.6

## 2017-08-12 NOTE — Patient Instructions (Signed)
Take 3 tablets today, then resume same dosage 2 tablets daily except 1 tablet on Mondays and Fridays.  Repeat INR in 2 weeks. Call if placed on any new medications of have any concerns  [phone]

## 2017-08-13 ENCOUNTER — Other Ambulatory Visit: Payer: Self-pay | Admitting: *Deleted

## 2017-08-13 ENCOUNTER — Telehealth: Payer: Self-pay | Admitting: Cardiology

## 2017-08-13 MED ORDER — LOSARTAN POTASSIUM 50 MG PO TABS: 75.0000 mg | ORAL_TABLET | Freq: Every day | ORAL | 1 refills | Status: DC

## 2017-08-13 NOTE — Telephone Encounter (Signed)
°*  STAT* If patient is at the pharmacy, call can be transferred to refill team.   1. Which medications need to be refilled? (please list name of each medication and dose if known) losartin 50mg  2. Which pharmacy/location (including street and city if local pharmacy) is medication to be sent to? [facility] [address] 3. Do they need a 30 day or 90 day supply? [place]

## 2017-08-20 ENCOUNTER — Other Ambulatory Visit: Payer: Self-pay | Admitting: Pharmacist Clinician (PhC)/ Clinical Pharmacy Specialist

## 2017-08-20 MED ORDER — WARFARIN SODIUM 5 MG PO TABS: ORAL_TABLET | ORAL | 3 refills | Status: DC

## 2017-08-29 ENCOUNTER — Ambulatory Visit (INDEPENDENT_AMBULATORY_CARE_PROVIDER_SITE_OTHER): Payer: PPO | Admitting: *Deleted

## 2017-08-29 DIAGNOSIS — I4891 Unspecified atrial fibrillation: Secondary | ICD-10-CM

## 2017-08-29 DIAGNOSIS — I471 Supraventricular tachycardia: Secondary | ICD-10-CM | POA: Diagnosis not present

## 2017-08-29 DIAGNOSIS — Z5181 Encounter for therapeutic drug level monitoring: Secondary | ICD-10-CM | POA: Diagnosis not present

## 2017-08-29 DIAGNOSIS — Z7901 Long term (current) use of anticoagulants: Secondary | ICD-10-CM

## 2017-08-29 LAB — POCT INR: INR: 1.4

## 2017-08-29 NOTE — Patient Instructions (Signed)
Description   Today Dec 14th take 1 and 1/2 tablets (7.5mg ) then tomorrow Dec 15th take 2 and 1/2 tablets (12.5mg )  then change coumadin dose to 2 tablets  daily except 1 tablet only on Fridays.  Repeat INR in 2 weeks. Call if placed on any new medications of have any concerns  [phone]

## 2017-09-10 ENCOUNTER — Emergency Department (HOSPITAL_COMMUNITY): Payer: PPO

## 2017-09-10 ENCOUNTER — Observation Stay (HOSPITAL_COMMUNITY): Payer: PPO

## 2017-09-10 ENCOUNTER — Observation Stay (HOSPITAL_COMMUNITY)
Admission: EM | Admit: 2017-09-10 | Discharge: 2017-09-11 | Disposition: A | Discharge status: Home / Self Care | Payer: PPO | Attending: Family Medicine | Admitting: Family Medicine

## 2017-09-10 ENCOUNTER — Encounter (HOSPITAL_COMMUNITY): Payer: Self-pay | Admitting: Emergency Medicine

## 2017-09-10 DIAGNOSIS — I4891 Unspecified atrial fibrillation: Secondary | ICD-10-CM | POA: Diagnosis not present

## 2017-09-10 DIAGNOSIS — Z87891 Personal history of nicotine dependence: Secondary | ICD-10-CM | POA: Insufficient documentation

## 2017-09-10 DIAGNOSIS — I639 Cerebral infarction, unspecified: Secondary | ICD-10-CM | POA: Diagnosis not present

## 2017-09-10 DIAGNOSIS — I10 Essential (primary) hypertension: Secondary | ICD-10-CM | POA: Insufficient documentation

## 2017-09-10 DIAGNOSIS — G459 Transient cerebral ischemic attack, unspecified: Secondary | ICD-10-CM | POA: Diagnosis not present

## 2017-09-10 DIAGNOSIS — I270 Primary pulmonary hypertension: Secondary | ICD-10-CM | POA: Diagnosis present

## 2017-09-10 DIAGNOSIS — Z7901 Long term (current) use of anticoagulants: Secondary | ICD-10-CM | POA: Diagnosis not present

## 2017-09-10 DIAGNOSIS — Z79899 Other long term (current) drug therapy: Secondary | ICD-10-CM | POA: Diagnosis not present

## 2017-09-10 DIAGNOSIS — R4781 Slurred speech: Secondary | ICD-10-CM | POA: Diagnosis not present

## 2017-09-10 DIAGNOSIS — R29810 Facial weakness: Secondary | ICD-10-CM | POA: Diagnosis present

## 2017-09-10 LAB — CBC
HCT: 39.4 % (ref 36.0–46.0)
Hemoglobin: 11.9 g/dL — ABNORMAL LOW (ref 12.0–15.0)
MCH: 24.3 pg — AB (ref 26.0–34.0)
MCHC: 30.2 g/dL (ref 30.0–36.0)
MCV: 80.6 fL (ref 78.0–100.0)
PLATELETS: 369 10*3/uL (ref 150–400)
RBC: 4.89 MIL/uL (ref 3.87–5.11)
RDW: 13.4 % (ref 11.5–15.5)
WBC: 5.8 10*3/uL (ref 4.0–10.5)

## 2017-09-10 LAB — I-STAT CHEM 8, ED
BUN: 22 mg/dL — ABNORMAL HIGH (ref 6–20)
Calcium, Ion: 1.19 mmol/L (ref 1.15–1.40)
Chloride: 104 mmol/L (ref 101–111)
Creatinine, Ser: 1.2 mg/dL — ABNORMAL HIGH (ref 0.44–1.00)
GLUCOSE: 123 mg/dL — AB (ref 65–99)
HEMATOCRIT: 40 % (ref 36.0–46.0)
HEMOGLOBIN: 13.6 g/dL (ref 12.0–15.0)
POTASSIUM: 4.1 mmol/L (ref 3.5–5.1)
Sodium: 142 mmol/L (ref 135–145)
TCO2: 26 mmol/L (ref 22–32)

## 2017-09-10 LAB — APTT: aPTT: 34 seconds (ref 24–36)

## 2017-09-10 LAB — COMPREHENSIVE METABOLIC PANEL
ALT: 30 U/L (ref 14–54)
ANION GAP: 10 (ref 5–15)
AST: 33 U/L (ref 15–41)
Albumin: 3.3 g/dL — ABNORMAL LOW (ref 3.5–5.0)
Alkaline Phosphatase: 105 U/L (ref 38–126)
BUN: 18 mg/dL (ref 6–20)
CHLORIDE: 104 mmol/L (ref 101–111)
CO2: 24 mmol/L (ref 22–32)
Calcium: 9.3 mg/dL (ref 8.9–10.3)
Creatinine, Ser: 1.18 mg/dL — ABNORMAL HIGH (ref 0.44–1.00)
GFR calc non Af Amer: 47 mL/min — ABNORMAL LOW (ref >60)
GFR, EST AFRICAN AMERICAN: 55 mL/min — AB (ref >60)
Glucose, Bld: 128 mg/dL — ABNORMAL HIGH (ref 65–99)
Potassium: 3.8 mmol/L (ref 3.5–5.1)
SODIUM: 138 mmol/L (ref 135–145)
Total Bilirubin: 0.5 mg/dL (ref 0.3–1.2)
Total Protein: 8 g/dL (ref 6.5–8.1)

## 2017-09-10 LAB — I-STAT TROPONIN, ED: TROPONIN I, POC: 0.01 ng/mL (ref 0.00–0.08)

## 2017-09-10 LAB — DIFFERENTIAL
BASOS PCT: 0 %
Basophils Absolute: 0 10*3/uL (ref 0.0–0.1)
EOS ABS: 0.2 10*3/uL (ref 0.0–0.7)
EOS PCT: 4 %
Lymphocytes Relative: 35 %
Lymphs Abs: 2 10*3/uL (ref 0.7–4.0)
MONO ABS: 0.4 10*3/uL (ref 0.1–1.0)
Monocytes Relative: 6 %
Neutro Abs: 3.1 10*3/uL (ref 1.7–7.7)
Neutrophils Relative %: 55 %

## 2017-09-10 LAB — PROTIME-INR
INR: 1.48
PROTHROMBIN TIME: 17.8 s — AB (ref 11.4–15.2)

## 2017-09-10 LAB — CBG MONITORING, ED: GLUCOSE-CAPILLARY: 122 mg/dL — AB (ref 65–99)

## 2017-09-10 MED ORDER — ACETAMINOPHEN 650 MG RE SUPP: 650.0000 mg | RECTAL | Status: DC | PRN

## 2017-09-10 MED ORDER — DOFETILIDE 250 MCG PO CAPS
250.0000 ug | ORAL_CAPSULE | Freq: Two times a day (BID) | ORAL | Status: DC
  Administered 2017-09-11 (×2): 250 ug via ORAL
  Filled 2017-09-10 (×3): qty 1

## 2017-09-10 MED ORDER — ACETAMINOPHEN 160 MG/5ML PO SOLN: 650.0000 mg | ORAL | Status: DC | PRN

## 2017-09-10 MED ORDER — LOSARTAN POTASSIUM 25 MG PO TABS
75.0000 mg | ORAL_TABLET | Freq: Every day | ORAL | Status: DC
  Administered 2017-09-11: 75 mg via ORAL
  Filled 2017-09-10: qty 1

## 2017-09-10 MED ORDER — MAGNESIUM OXIDE 400 (241.3 MG) MG PO TABS
800.0000 mg | ORAL_TABLET | Freq: Every day | ORAL | Status: DC
  Administered 2017-09-11: 800 mg via ORAL
  Filled 2017-09-10: qty 2

## 2017-09-10 MED ORDER — POTASSIUM CHLORIDE CRYS ER 20 MEQ PO TBCR
20.0000 meq | EXTENDED_RELEASE_TABLET | Freq: Every day | ORAL | Status: DC
  Administered 2017-09-11: 20 meq via ORAL
  Filled 2017-09-10: qty 1

## 2017-09-10 MED ORDER — STROKE: EARLY STAGES OF RECOVERY BOOK
Freq: Once | Status: AC
  Administered 2017-09-11: 06:00:00
  Filled 2017-09-10: qty 1

## 2017-09-10 MED ORDER — ACETAMINOPHEN 325 MG PO TABS: 650.0000 mg | ORAL_TABLET | ORAL | Status: DC | PRN

## 2017-09-10 MED ORDER — METOPROLOL TARTRATE 25 MG PO TABS
25.0000 mg | ORAL_TABLET | Freq: Two times a day (BID) | ORAL | Status: DC
  Administered 2017-09-11 (×2): 25 mg via ORAL
  Filled 2017-09-10 (×2): qty 1

## 2017-09-10 MED ORDER — LORAZEPAM 2 MG/ML IJ SOLN
0.5000 mg | Freq: Once | INTRAMUSCULAR | Status: AC
  Administered 2017-09-10: 0.5 mg via INTRAVENOUS
  Filled 2017-09-10: qty 1

## 2017-09-10 NOTE — ED Notes (Signed)
Pt ambulatory to bathroom with steady gait at this time .

## 2017-09-10 NOTE — ED Notes (Signed)
Pt ambulatory to bathroom with no difficulty 

## 2017-09-10 NOTE — ED Provider Notes (Signed)
[facility] EMERGENCY DEPARTMENT Provider Note   CSN: [id number] Arrival date & time: 09/10/17  1737     History   Chief Complaint Chief Complaint  Patient presents with  . Cerebrovascular Accident    HPI [name] is a 66 y.o. female.  HPI   Patient 66 year old female with history of A. fib a flutter, on warfarin.  She is presenting today with strokelike symptoms.  Patient had TIA symptoms prior to arrival.  Patient was at the sink washing dishes when her grandson noticed that she started drooling.  Patient states had a right sided facial numness.  Patient's daughter knocked on the door she noticed that she had a facial droop and said "mom having a stroke".  Patient had slurred speech.  Brought by family to the emergency department.  Symptoms had resolved prior to arrival.  Past Medical History:  Diagnosis Date  . Atrial fibrillation ([place])   . Atrial flutter ([place])   . Hypertension   . Left ventricular hypertrophy    possibly hypertrophic cardiomyopathy  . Obesity     Patient Active Problem List   Diagnosis Date Noted  . Pulmonary hypertension, primary ([place]) 01/22/2017  . Encounter for therapeutic drug monitoring 11/08/2013  . Long term (current) use of anticoagulants 08/14/2011  . QT prolongation 08/10/2011  . Mitral regurgitation 08/09/2011  . URI (upper respiratory infection) 08/09/2011  . Atrial fibrillation ([place]) 03/09/2009  . OBESITY 11/10/2008  . HYPERTROPHIC CARDIOMYOPATHY 11/10/2008  . HYPERTENSION, BENIGN 10/12/2008  . SVT/ PSVT/ PAT 10/12/2008    Past Surgical History:  Procedure Laterality Date  . ABDOMINAL HYSTERECTOMY      OB History    No data available       Home Medications    Prior to Admission medications   Medication Sig Start Date End Date Taking? Authorizing Provider  CALCIUM PO Take 1 tablet by mouth 2 (two) times daily.      [provider]  dofetilide (TIKOSYN) 250 MCG capsule Take 1 capsule  (250 mcg total) by mouth 2 (two) times daily. 01/09/17 05/14/18  Minus Breeding, MD  hydrocortisone 1 % lotion Apply 1 application topically daily. For dry skin on face.     [provider]  losartan (COZAAR) 50 MG tablet Take 1.5 tablets (75 mg total) by mouth daily. 08/13/17   Minus Breeding, MD  magnesium oxide (MAGNESIUM-OXIDE) 400 (241.3 Mg) MG tablet Take 2 tablets (800 mg total) daily by mouth. 07/21/17   Minus Breeding, MD  metoprolol tartrate (LOPRESSOR) 25 MG tablet Take 1 tablet (25 mg total) by mouth 2 (two) times daily. 01/21/17   Minus Breeding, MD  metoprolol tartrate (LOPRESSOR) 25 MG tablet Take 1 tablet (25 mg total) by mouth 2 (two) times daily. 03/10/17   Minus Breeding, MD  OVER THE COUNTER MEDICATION Apply 1 application topically daily as needed. "athletes foot cream"     [provider]  potassium chloride SA (K-DUR,KLOR-CON) 20 MEQ tablet Take 1 tablet (20 mEq total) by mouth daily. 01/21/17   Minus Breeding, MD  potassium chloride SA (K-DUR,KLOR-CON) 20 MEQ tablet TAKE 1 TABLET BY MOUTH DAILY. 03/10/17   Minus Breeding, MD  warfarin (COUMADIN) 5 MG tablet TAKE 1 TO 2 TABLETS BY MOUTH EVERY DAY OR AS DIRECTED BY COUMADIN 08/20/17   Minus Breeding, MD    Family History Family History  Problem Relation Age of Onset  . Heart failure Mother   . Diabetes Daughter     Social History Social  History   Tobacco Use  . Smoking status: Former Smoker    Types: Cigarettes  . Smokeless tobacco: Never Used  Substance Use Topics  . Alcohol use: No  . Drug use: No     Allergies   Patient has no known allergies.   Review of Systems Review of Systems  Constitutional: Negative for activity change.  Respiratory: Negative for shortness of breath.   Cardiovascular: Negative for chest pain.  Gastrointestinal: Negative for abdominal pain.  Neurological: Positive for facial asymmetry, weakness and numbness.  All other systems reviewed and are  negative.    Physical Exam Updated Vital Signs BP (!) 200/102   Pulse 88   Temp 98.6 F (37 C) (Oral)   Resp 18   SpO2 100%   Physical Exam  Constitutional: She is oriented to person, place, and time. She appears well-developed and well-nourished.  HENT:  Head: Normocephalic and atraumatic.  Eyes: Right eye exhibits no discharge. Left eye exhibits no discharge.  Cardiovascular: Normal rate, regular rhythm and normal heart sounds.  No murmur heard. Pulmonary/Chest: Effort normal and breath sounds normal. She has no wheezes. She has no rales.  Abdominal: Soft. She exhibits no distension. There is no tenderness.  Neurological: She is oriented to person, place, and time.  Equal strength bilaterally upper and lower extremities negative pronator drift. Normal sensation bilaterally. Speech comprehensible, no slurring. Facial nerve tested and appears grossly normal. Alert and oriented 3.   Skin: Skin is warm and dry. She is not diaphoretic.  Psychiatric: She has a normal mood and affect.  Nursing note and vitals reviewed.    ED Treatments / Results  Labs (all labs ordered are listed, but only abnormal results are displayed) Labs Reviewed  CBG MONITORING, ED - Abnormal; Notable for the following components:      Result Value   Glucose-Capillary 122 (*)    All other components within normal limits  I-STAT CHEM 8, ED - Abnormal; Notable for the following components:   BUN 22 (*)    Creatinine, Ser 1.20 (*)    Glucose, Bld 123 (*)    All other components within normal limits  PROTIME-INR  APTT  CBC  DIFFERENTIAL  COMPREHENSIVE METABOLIC PANEL  I-STAT TROPONIN, ED    EKG  EKG Interpretation None       Radiology Ct Head Wo Contrast  Result Date: 09/10/2017 CLINICAL DATA:  Slurred speech and right-sided facial droop. EXAM: CT HEAD WITHOUT CONTRAST TECHNIQUE: Contiguous axial images were obtained from the base of the skull through the vertex without intravenous  contrast. COMPARISON:  None. FINDINGS: Brain: Small area of hypoattenuation in the right temporal lobe adjacent to the sylvian fissure, with uncertain significance. Otherwise no evidence of intracranial hemorrhage or mass effect. Normal appearance of the ventricles. Vascular: No hyperdense vessel or unexpected calcification. Skull: Normal. Negative for fracture or focal lesion. Sinuses/Orbits: No acute finding. Other: None. IMPRESSION: Subcentimeter area of hypoattenuation in the right temporal lobe, adjacent to the sylvian fissure, with uncertain significance. This may represent an age-indeterminate lacunar infarct, or dilated perivascular space. No evidence of intracranial hemorrhage. Electronically Signed   By: Fidela Salisbury M.D.   On: 09/10/2017 18:31    Procedures Procedures (including critical care time)  Medications Ordered in ED Medications - No data to display   Initial Impression / Assessment and Plan / ED Course  I have reviewed the triage vital signs and the nursing notes.  Pertinent labs & imaging results that were available during my  care of the patient were reviewed by me and considered in my medical decision making (see chart for details).     Patient 66 year old female with history of A. fib a flutter, on warfarin.  She is presenting today with strokelike symptoms.  Patient had TIA symptoms prior to arrival.  Patient was at the sink washing dishes when her grandson noticed that she started drooling.  Patient states had a right sided facial numness.  Patient's daughter knocked on the door she noticed that she had a facial droop and said "mom having a stroke".  Patient had slurred speech.  Brought by family to the emergency department.  Symptoms had resolved prior to arrival.  6:37 PM Patient here with TIA symptoms.  Already and on anticoagulant.  Will admit for TIA workup.  Discussed with K[place]. Will get MRI. IF negative, will admit with heparin bridge to coumadin.   If positive, would hold off because of risk of conversion.   Final Clinical Impressions(s) / ED Diagnoses   Final diagnoses:  None    ED Discharge Orders    None       Macarthur Critchley, MD 09/10/17 2311

## 2017-09-10 NOTE — ED Notes (Signed)
Pt was evaluated by EDP Mackuen.

## 2017-09-10 NOTE — Consult Note (Signed)
Neurology Consultation Reason for Consult: Transient slurred speech Referring Physician: Thomasene Lot, C  CC: Transient slurred speech  History is obtained from: Daughter, patient  HPI: [name] is a 66 y.o. female who feels that she is currently normal.  Her daughter states that when she came home around according to 6, she was slurring her words.  She did talk to her brother around 4 PM, was apparently relatively normal at that time.   Her daughter states that she is also drooling from the left side.  They were concerned for stroke and brought her to the emergency department.  Her deficits had improved and the patient stated that she felt normal by the time of her arrival.  Neurology was consulted for TIA.    LKW: Possibly 4 PM tpa given?: no, rapidly improving deficits,  unclear time of onset    ROS: A 14 point ROS was performed and is negative except as noted in the HPI.   Past Medical History:  Diagnosis Date  . Atrial fibrillation ([place])   . Atrial flutter ([place])   . Hypertension   . Left ventricular hypertrophy    possibly hypertrophic cardiomyopathy  . Obesity      Family History  Problem Relation Age of Onset  . Heart failure Mother   . Diabetes Daughter      Social History:  reports that she has quit smoking. Her smoking use included cigarettes. she has never used smokeless tobacco. She reports that she does not drink alcohol or use drugs.   Exam: Current vital signs: BP (!) 184/95   Pulse 72   Temp 98.6 F (37 C)   Resp (!) 29   SpO2 95%  Vital signs in last 24 hours: Temp:  [98.6 F (37 C)] 98.6 F (37 C) (12/26 2000) Pulse Rate:  [69-88] 72 (12/26 2015) Resp:  [16-29] 29 (12/26 2015) BP: (181-200)/(82-104) 184/95 (12/26 2015) SpO2:  [93 %-100 %] 95 % (12/26 2015)   Physical Exam  Constitutional: Appears well-developed and well-nourished.  Psych: Affect appropriate to situation Eyes: No scleral injection HENT: No OP obstrucion Head:  Normocephalic.  Cardiovascular: Normal rate and regular rhythm.  Respiratory: Effort normal, non-labored breathing GI: Soft.  No distension. There is no tenderness.  Skin: WDI  Neuro: Mental Status: Patient is awake, alert, oriented to person, place, month, year, and situation. Patient is able to give a clear and coherent history. No signs of aphasia or neglect Cranial Nerves: II: Visual Fields are full, though her answers are slow and less accurate in the left visual field. Pupils are equal, round, and reactive to light.   III,IV, VI: EOMI without ptosis or diploplia.  V: Facial sensation is symmetric to temperature VII: Facial movement is symmetric.  VIII: hearing is intact to voice X: Uvula elevates symmetrically XI: Shoulder shrug is symmetric. XII: tongue is midline without atrophy or fasciculations.  Motor: Tone is normal. Bulk is normal. 5/5 strength was present in all four extremities.  Sensory: She endorses symmetric sensation, though neglects to touch on the left side. Cerebellar: No clear ataxia   I have reviewed labs in epic and the results pertinent to this consultation are: CMP-borderline creatinine CBG-122  I have reviewed the images obtained: CT head is negative  Impression: 66 year old female with likely small cortical stroke in the setting of subtherapeutic INR.  She does have other risk factors for atherosclerotic disease, and therefore be reasonable to perform a full workup.  I would use aspirin for now pending  knowing how large the infarct actually is.  Recommendations: 1. HgbA1c, fasting lipid panel 2. MRI, MRA  of the brain without contrast 3. Frequent neuro checks 4. Echocardiogram 5. MRA neck 6. Prophylactic therapy-Antiplatelet med: Aspirin - dose 325mg  PO or 300mg  PR 7. Risk factor modification 8. Telemetry monitoring 9. PT consult, OT consult, Speech consult 10. please page stroke NP  Or  PA  Or MD  from 8am -4 pm as this patient will be  followed by the stroke team at this point.   You can look them up on [email or web address]      Roland Rack, MD [facility] [phone]  If 7pm- 7am, please page neurology on call as listed in [place].

## 2017-09-10 NOTE — H&P (Addendum)
History and Physical    [name] SWF:[id number] DOB: [birth year] DOA: 09/10/2017  PCP: Alroy Dust, L.Marlou Sa, MD  Patient coming from: Home.  Chief Complaint: Right facial droop.  HPI: [name] is a 66 y.o. female with history of atrial fibrillation, hypertension, hypertrophic cardiomyopathy was found to have right facial droop and slurred speech around 5 PM today noticed by patient's family.  Patient did not have any visual symptoms or difficulty moving upper or lower extremities.  Patient family immediately brought patient to the ER by the time patient reached ER symptoms had resolved.  ED Course: Patient was seen by the ER physician and felt that patient's symptoms are resolved and CT head done did not show any acute.  On-call neurologist was notified.  On exam patient had some neglect on the right side.  INR was subtherapeutic.  MRI of the brain has been ordered and is pending.  Review of Systems: As per HPI, rest all negative.   Past Medical History:  Diagnosis Date  . Atrial fibrillation ([place])   . Atrial flutter ([place])   . Hypertension   . Left ventricular hypertrophy    possibly hypertrophic cardiomyopathy  . Obesity     Past Surgical History:  Procedure Laterality Date  . ABDOMINAL HYSTERECTOMY       reports that she has quit smoking. Her smoking use included cigarettes. she has never used smokeless tobacco. She reports that she does not drink alcohol or use drugs.  No Known Allergies  Family History  Problem Relation Age of Onset  . Heart failure Mother   . Diabetes Daughter     Prior to Admission medications   Medication Sig Start Date End Date Taking? Authorizing Provider  dofetilide (TIKOSYN) 250 MCG capsule Take 1 capsule (250 mcg total) by mouth 2 (two) times daily. 01/09/17 05/14/18 Yes HochreinJeneen Rinks, MD  hydrocortisone 1 % lotion Apply 1 application topically daily. For dry skin on face.    Yes [provider]  losartan (COZAAR) 50 MG  tablet Take 1.5 tablets (75 mg total) by mouth daily. 08/13/17  Yes Minus Breeding, MD  magnesium oxide (MAGNESIUM-OXIDE) 400 (241.3 Mg) MG tablet Take 2 tablets (800 mg total) daily by mouth. 07/21/17  Yes Minus Breeding, MD  metoprolol tartrate (LOPRESSOR) 25 MG tablet Take 1 tablet (25 mg total) by mouth 2 (two) times daily. 03/10/17  Yes Minus Breeding, MD  OVER THE COUNTER MEDICATION Apply 1 application topically daily as needed. "athletes foot cream"    Yes [provider]  potassium chloride SA (K-DUR,KLOR-CON) 20 MEQ tablet Take 1 tablet (20 mEq total) by mouth daily. 01/21/17  Yes Minus Breeding, MD  warfarin (COUMADIN) 5 MG tablet TAKE 1 TO 2 TABLETS BY MOUTH EVERY DAY OR AS DIRECTED BY COUMADIN Patient taking differently: Take 5-10 mg by mouth See admin instructions. 5mg  on Mon/Fri 10mg  on Sun/Tues/Wed/Thurs/Sat 08/20/17  Yes Hochrein, Jeneen Rinks, MD  metoprolol tartrate (LOPRESSOR) 25 MG tablet Take 1 tablet (25 mg total) by mouth 2 (two) times daily. Patient not taking: Reported on 09/10/2017 01/21/17   Minus Breeding, MD  potassium chloride SA (K-DUR,KLOR-CON) 20 MEQ tablet TAKE 1 TABLET BY MOUTH DAILY. Patient not taking: Reported on 09/10/2017 03/10/17   Minus Breeding, MD    Physical Exam: Vitals:   09/10/17 1915 09/10/17 1945 09/10/17 2000 09/10/17 2015  BP: (!) 181/84 (!) 183/82  (!) 184/95  Pulse: 71 69  72  Resp: (!) 25 (!) 22  (!) 29  Temp:  98.6 F (37 C)   TempSrc:      SpO2: 93% 93%  95%      Constitutional: Moderately built and nourished. Vitals:   09/10/17 1915 09/10/17 1945 09/10/17 2000 09/10/17 2015  BP: (!) 181/84 (!) 183/82  (!) 184/95  Pulse: 71 69  72  Resp: (!) 25 (!) 22  (!) 29  Temp:   98.6 F (37 C)   TempSrc:      SpO2: 93% 93%  95%   Eyes: Anicteric no pallor. ENMT: No discharge from the ears eyes nose or mouth. Neck: No mass felt.  No neck rigidity. Respiratory: No rhonchi or crepitations. Cardiovascular: S1-S2 heard no murmurs  appreciated. Abdomen: Soft nontender bowel sounds present. Musculoskeletal: No edema.  No joint effusion. Skin: No rash.  Skin appears warm. Neurologic: Alert awake oriented to time place and person.  Moves all extremities 5 x 5.  Patient has some right-sided neglect.  Pupils are equal and reacting to light.  No pronator drift.  No facial asymmetry.  Tongue is midline. Psychiatric: Appears normal.  Normal affect.   Labs on Admission: I have personally reviewed following labs and imaging studies.  CBC: Recent Labs  Lab 09/10/17 1800 09/10/17 1815  WBC 5.8  --   NEUTROABS 3.1  --   HGB 11.9* 13.6  HCT 39.4 40.0  MCV 80.6  --   PLT 369  --    Basic Metabolic Panel: Recent Labs  Lab 09/10/17 1800 09/10/17 1815  NA 138 142  K 3.8 4.1  CL 104 104  CO2 24  --   GLUCOSE 128* 123*  BUN 18 22*  CREATININE 1.18* 1.20*  CALCIUM 9.3  --    GFR: CrCl cannot be calculated (Unknown ideal weight.). Liver Function Tests: Recent Labs  Lab 09/10/17 1800  AST 33  ALT 30  ALKPHOS 105  BILITOT 0.5  PROT 8.0  ALBUMIN 3.3*   No results for input(s): LIPASE, AMYLASE in the last 168 hours. No results for input(s): AMMONIA in the last 168 hours. Coagulation Profile: Recent Labs  Lab 09/10/17 1800  INR 1.48   Cardiac Enzymes: No results for input(s): CKTOTAL, CKMB, CKMBINDEX, TROPONINI in the last 168 hours. BNP (last 3 results) No results for input(s): PROBNP in the last 8760 hours. HbA1C: No results for input(s): HGBA1C in the last 72 hours. CBG: Recent Labs  Lab 09/10/17 1826  GLUCAP 122*   Lipid Profile: No results for input(s): CHOL, HDL, LDLCALC, TRIG, CHOLHDL, LDLDIRECT in the last 72 hours. Thyroid Function Tests: No results for input(s): TSH, T4TOTAL, FREET4, T3FREE, THYROIDAB in the last 72 hours. Anemia Panel: No results for input(s): VITAMINB12, FOLATE, FERRITIN, TIBC, IRON, RETICCTPCT in the last 72 hours. Urine analysis: No results found for: COLORURINE,  APPEARANCEUR, LABSPEC, PHURINE, GLUCOSEU, HGBUR, BILIRUBINUR, KETONESUR, PROTEINUR, UROBILINOGEN, NITRITE, LEUKOCYTESUR Sepsis Labs: @LABRCNTIP (procalcitonin:4,lacticidven:4) )No results found for this or any previous visit (from the past 240 hour(s)).   Radiological Exams on Admission: Ct Head Wo Contrast  Result Date: 09/10/2017 CLINICAL DATA:  Slurred speech and right-sided facial droop. EXAM: CT HEAD WITHOUT CONTRAST TECHNIQUE: Contiguous axial images were obtained from the base of the skull through the vertex without intravenous contrast. COMPARISON:  None. FINDINGS: Brain: Small area of hypoattenuation in the right temporal lobe adjacent to the sylvian fissure, with uncertain significance. Otherwise no evidence of intracranial hemorrhage or mass effect. Normal appearance of the ventricles. Vascular: No hyperdense vessel or unexpected calcification. Skull: Normal. Negative for fracture or focal  lesion. Sinuses/Orbits: No acute finding. Other: None. IMPRESSION: Subcentimeter area of hypoattenuation in the right temporal lobe, adjacent to the sylvian fissure, with uncertain significance. This may represent an age-indeterminate lacunar infarct, or dilated perivascular space. No evidence of intracranial hemorrhage. Electronically Signed   By: Fidela Salisbury M.D.   On: 09/10/2017 18:31    EKG: Independently reviewed.  Normal sinus rhythm with ectopic atrial rhythm.  Assessment/Plan Principal Problem:   TIA (transient ischemic attack) Active Problems:   HYPERTENSION, BENIGN   Atrial fibrillation ([place])   Pulmonary hypertension, primary ([place])    1. TIA versus stroke -discussed with on-call neurologist Dr. Leonel Ramsay.  Pending MRI/MRA brain 2D echo and MRA neck.  Patient has stroke then neurologist has recommended to hold off Coumadin.  Patient does not have stroke and his TIA then will need heparin bridging for Coumadin for A. fib.  Will place patient on statins.  Physical therapy consult  patient passed swallow.  Check hemoglobin A1c and lipid panel. 2. Hypertension -allow for permissive hypertension.  Patient is on metoprolol and Cozaar. 3. Hypertrophic cardiomyopathy on metoprolol. 4. Mild acute renal failure probably from dehydration check UA.  Gently hydrate.  If creatinine does not improve may have to hold ARB. 5. Chronic atrial fibrillation -chads 2 vasc score probably around 2-3.  With regarding anticoagulation see #1.  Patient is on metoprolol and Tikosyn.   DVT prophylaxis: Lovenox. Code Status: Full code. Family Communication: Patient family at the bedside. Disposition Plan: Home. Consults called: Neurology. Admission status: Observation.   Rise Patience MD [facility] Pager [phone].  If 7PM-7AM, please contact night-coverage [email or web address] Password [facility]  09/10/2017, 9:36 PM

## 2017-09-10 NOTE — ED Notes (Addendum)
Daughter gave pt 2 81 mg aspirin during incident. Pt currently on warifarin

## 2017-09-10 NOTE — ED Triage Notes (Addendum)
Pt states 15 minute ago she was washing dishes. Then she remembers her grandson calling "grandma grandma" to get her attention, she noted she was drooling. Pt daughter noted slurred speech and a facial droop. Pt has no deficits at present. LSN 1725. Per MD Mackuen, no "Code stroke" due to pt resolved symptoms, just use neuro order set.

## 2017-09-10 NOTE — ED Notes (Signed)
Patient transported to X-ray 

## 2017-09-11 ENCOUNTER — Observation Stay (HOSPITAL_BASED_OUTPATIENT_CLINIC_OR_DEPARTMENT_OTHER): Payer: PPO

## 2017-09-11 ENCOUNTER — Other Ambulatory Visit: Payer: Self-pay

## 2017-09-11 DIAGNOSIS — I639 Cerebral infarction, unspecified: Secondary | ICD-10-CM | POA: Diagnosis not present

## 2017-09-11 DIAGNOSIS — I10 Essential (primary) hypertension: Secondary | ICD-10-CM | POA: Diagnosis not present

## 2017-09-11 DIAGNOSIS — G459 Transient cerebral ischemic attack, unspecified: Secondary | ICD-10-CM

## 2017-09-11 DIAGNOSIS — I270 Primary pulmonary hypertension: Secondary | ICD-10-CM | POA: Diagnosis not present

## 2017-09-11 DIAGNOSIS — I63233 Cerebral infarction due to unspecified occlusion or stenosis of bilateral carotid arteries: Secondary | ICD-10-CM | POA: Diagnosis not present

## 2017-09-11 DIAGNOSIS — R4781 Slurred speech: Secondary | ICD-10-CM | POA: Diagnosis not present

## 2017-09-11 DIAGNOSIS — I4891 Unspecified atrial fibrillation: Secondary | ICD-10-CM | POA: Diagnosis not present

## 2017-09-11 LAB — LIPID PANEL
Cholesterol: 136 mg/dL (ref 0–200)
HDL: 37 mg/dL — ABNORMAL LOW (ref >40)
LDL CALC: 88 mg/dL (ref 0–99)
Total CHOL/HDL Ratio: 3.7 RATIO
Triglycerides: 53 mg/dL (ref <150)
VLDL: 11 mg/dL (ref 0–40)

## 2017-09-11 LAB — ECHOCARDIOGRAM COMPLETE
HEIGHTINCHES: 63 in
Weight: 3322.77 oz

## 2017-09-11 LAB — HIV ANTIBODY (ROUTINE TESTING W REFLEX): HIV SCREEN 4TH GENERATION: NONREACTIVE

## 2017-09-11 LAB — HEMOGLOBIN A1C
Hgb A1c MFr Bld: 6.8 % — ABNORMAL HIGH (ref 4.8–5.6)
Mean Plasma Glucose: 148.46 mg/dL

## 2017-09-11 MED ORDER — GADOBENATE DIMEGLUMINE 529 MG/ML IV SOLN
20.0000 mL | Freq: Once | INTRAVENOUS | Status: AC | PRN
  Administered 2017-09-11: 20 mL via INTRAVENOUS

## 2017-09-11 MED ORDER — SODIUM CHLORIDE 0.9 % IV SOLN
INTRAVENOUS | Status: DC
  Administered 2017-09-11: 06:00:00 via INTRAVENOUS

## 2017-09-11 MED ORDER — ATORVASTATIN CALCIUM 40 MG PO TABS
40.0000 mg | ORAL_TABLET | Freq: Every day | ORAL | 0 refills | Status: DC
Start: 2017-09-12 — End: 2017-12-26

## 2017-09-11 MED ORDER — ATORVASTATIN CALCIUM 80 MG PO TABS: 80.0000 mg | ORAL_TABLET | Freq: Every day | ORAL | Status: DC

## 2017-09-11 MED ORDER — INFLUENZA VAC SPLIT HIGH-DOSE 0.5 ML IM SUSY: 0.5000 mL | PREFILLED_SYRINGE | INTRAMUSCULAR | Status: DC

## 2017-09-11 MED ORDER — ENOXAPARIN SODIUM 40 MG/0.4ML ~~LOC~~ SOLN
40.0000 mg | Freq: Every day | SUBCUTANEOUS | Status: DC
  Administered 2017-09-11: 40 mg via SUBCUTANEOUS
  Filled 2017-09-11: qty 0.4

## 2017-09-11 MED ORDER — RIVAROXABAN 20 MG PO TABS: 20.0000 mg | ORAL_TABLET | Freq: Every day | ORAL | 0 refills | Status: DC

## 2017-09-11 MED ORDER — RIVAROXABAN 20 MG PO TABS
20.0000 mg | ORAL_TABLET | Freq: Every day | ORAL | Status: DC
  Administered 2017-09-11: 20 mg via ORAL
  Filled 2017-09-11: qty 1

## 2017-09-11 MED ORDER — ATORVASTATIN CALCIUM 40 MG PO TABS
40.0000 mg | ORAL_TABLET | Freq: Every day | ORAL | Status: DC
  Administered 2017-09-11: 40 mg via ORAL
  Filled 2017-09-11: qty 1

## 2017-09-11 NOTE — Progress Notes (Signed)
Chaplain attempted to facilitate AD HCPOA conversation twice per consult.  At both times of visit Patient was being attended to by members of the clinical care team.  Clark will follow up and seek to facilitate conversation and execute AD HCPOA at another time.

## 2017-09-11 NOTE — Discharge Summary (Signed)
Physician Discharge Summary  [name] PJK:[id number] DOB: 07-10-1951 DOA: 09/10/2017  PCP: Alroy Dust, L.Marlou Sa, MD  Admit date: 09/10/2017 Discharge date: 09/11/2017  Admitted From: Home Disposition: Home  Recommendations for Outpatient Follow-up:  1. Follow up with PCP in 1 week 2. Follow up with neurology in 6 weeks 3. Please obtain BMP/CBC in one week 4. Please follow up on the following pending results: None  Home Health: None Equipment/Devices: None  Discharge Condition: Stable CODE STATUS: Full code Diet recommendation: Heart healthy   Brief/Interim Summary:  Admission HPI written by Rise Patience, MD   Chief Complaint: Right facial droop.  HPI: [name] is a 66 y.o. female with history of atrial fibrillation, hypertension, hypertrophic cardiomyopathy was found to have right facial droop and slurred speech around 5 PM today noticed by patient's family.  Patient did not have any visual symptoms or difficulty moving upper or lower extremities.  Patient family immediately brought patient to the ER by the time patient reached ER symptoms had resolved.  ED Course: Patient was seen by the ER physician and felt that patient's symptoms are resolved and CT head done did not show any acute.  On-call neurologist was notified.  On exam patient had some neglect on the right side.  INR was subtherapeutic.  MRI of the brain has been ordered and is pending.    Hospital course:  Stroke MRI significant for acute/early subacute infarct of right posterior insula cortex and small focus in frontal operculum, subcentimeter cortical infarction of left parietal lobe and right M2 inferior devision occlusion in mid sylvian fissure. Likely cardioembolic the setting of subtherapeutic INR levels while on Coumadin therapy. Echocardiogram significant for grade 3 diastolic dysfunction. PT/OT recommended no follow-up. Carotid ultrasounds significant for 1-39% ICA stenosis bilaterally.  Patient started on Xarelto (discontinued Coumadin) and atorvastatin. Outpatient follow-up with neurology and PCP.  Hypertension Permissive hypertension, but patient was hypertensive during admission. May need further titration of antihypertensives as an outpatient. Titrate to goal BP over 3-5 days  Hypertrophic cardiomyopathy On metoprolol. Outpatient follow-up with cardiology  Acute kidney injury Probably from dehydration check UA. Given IV fluids. Recheck as an outpatient.  Chronic atrial fibrillation -chads 2 vasc score probably around 2-3.  With regarding anticoagulation see #1.  Patient is on metoprolol and Tikosyn.   Discharge Diagnoses:  Principal Problem:   TIA (transient ischemic attack) Active Problems:   HYPERTENSION, BENIGN   Atrial fibrillation ([place])   Pulmonary hypertension, primary [facility])    Discharge Instructions  Discharge Instructions    Ambulatory referral to Neurology   Complete by:  As directed    An appointment is requested in approximately:6 weeks Follow up with stroke clinic (Dr Leonie Man preferred, if not available, then consider Caesar Chestnut, [facility] or Jaynee Eagles whoever is available) at [facility] in about 6-8 weeks. Thanks.   Diet - low sodium heart healthy   Complete by:  As directed    Increase activity slowly   Complete by:  As directed      Allergies as of 09/11/2017   No Known Allergies     Medication List    TAKE these medications   atorvastatin 40 MG tablet Commonly known as:  LIPITOR Take 1 tablet (40 mg total) by mouth daily at 6 PM. Start taking on:  09/12/2017   dofetilide 250 MCG capsule Commonly known as:  TIKOSYN Take 1 capsule (250 mcg total) by mouth 2 (two) times daily.   hydrocortisone 1 % lotion Apply 1 application topically daily. For  dry skin on face.   losartan 50 MG tablet Commonly known as:  COZAAR Take 1.5 tablets (75 mg total) by mouth daily.   magnesium oxide 400 (241.3 Mg) MG tablet Commonly known as:   MAGNESIUM-OXIDE Take 2 tablets (800 mg total) daily by mouth.   metoprolol tartrate 25 MG tablet Commonly known as:  LOPRESSOR Take 1 tablet (25 mg total) by mouth 2 (two) times daily. What changed:  Another medication with the same name was removed. Continue taking this medication, and follow the directions you see here.   OVER THE COUNTER MEDICATION Apply 1 application topically daily as needed. "athletes foot cream"   potassium chloride SA 20 MEQ tablet Commonly known as:  K-DUR,KLOR-CON Take 1 tablet (20 mEq total) by mouth daily. What changed:  Another medication with the same name was removed. Continue taking this medication, and follow the directions you see here.   rivaroxaban 20 MG Tabs tablet Commonly known as:  XARELTO Take 1 tablet (20 mg total) by mouth daily with supper. Start taking on:  09/12/2017      Follow-up Information    Garvin Fila, MD. Schedule an appointment as soon as possible for a visit in 6 week(s).   Specialties:  Neurology, Radiology Contact information: [address] [place] [address] [phone]        Alroy Dust, L.Marlou Sa, MD. Schedule an appointment as soon as possible for a visit in 1 week(s).   Specialty:  Family Medicine Contact information: [address] [place] [address] [phone]          No Known Allergies  Consultations:  Neurology   Procedures/Studies: Ct Head Wo Contrast  Result Date: 09/10/2017 CLINICAL DATA:  Slurred speech and right-sided facial droop. EXAM: CT HEAD WITHOUT CONTRAST TECHNIQUE: Contiguous axial images were obtained from the base of the skull through the vertex without intravenous contrast. COMPARISON:  None. FINDINGS: Brain: Small area of hypoattenuation in the right temporal lobe adjacent to the sylvian fissure, with uncertain significance. Otherwise no evidence of intracranial hemorrhage or mass effect. Normal appearance of the ventricles. Vascular: No  hyperdense vessel or unexpected calcification. Skull: Normal. Negative for fracture or focal lesion. Sinuses/Orbits: No acute finding. Other: None. IMPRESSION: Subcentimeter area of hypoattenuation in the right temporal lobe, adjacent to the sylvian fissure, with uncertain significance. This may represent an age-indeterminate lacunar infarct, or dilated perivascular space. No evidence of intracranial hemorrhage. Electronically Signed   By: Fidela Salisbury M.D.   On: 09/10/2017 18:31   Mr Jodene Nam Neck W Wo Contrast  Result Date: 09/11/2017 CLINICAL DATA:  66 y/o  F; episode of slurred speech. EXAM: MR HEAD WITHOUT CONTRAST MRA HEAD WITHOUT CONTRAST MRA OF THE NECK WITHOUT AND WITH CONTRAST TECHNIQUE: Multiplanar, multiecho pulse sequences of the brain and surrounding structures were obtained without intravenous contrast. Angiographic images of the neck were obtained using MRA technique without and with intravenous contrast. Angiographic images of the head were obtained using MRA technique without intravenous contrast. CONTRAST:  90mL MULTIHANCE GADOBENATE DIMEGLUMINE 529 MG/ML IV SOLN COMPARISON:  09/10/2017 CT head FINDINGS: MR HEAD FINDINGS Brain: Cortical reduced diffusion within the right posterior insula and subcentimeter focus and right frontal operculum compatible with acute/ early subacute infarction. Additionally there is a subcentimeter focus of reduced diffusion in the left parietal cortex (series 3, image 43). Partially empty sella turcica. Few scattered nonspecific foci of T2 FLAIR hyperintense signal abnormality in subcortical and periventricular white matter are compatible with mild chronic  microvascular ischemic changes for age. Mild brain parenchymal volume loss. No focus of susceptibility hypointensity to indicate intracranial hemorrhage. No mass effect, no extra-axial collection, no hydrocephalus. Vascular: Curvilinear susceptibility hypointensity within the right posterior sylvian fissure in  the region of infarction probably representing arterial thrombus. Skull and upper cervical spine: Normal marrow signal. Sinuses/Orbits: Left maxillary sinus mucous retention cyst. Otherwise negative. Other: None. MRA HEAD FINDINGS Internal carotid arteries:  Patent. Anterior cerebral arteries:  Patent. Middle cerebral arteries: Patent left MCA and distal circulation. Patent right M1 and right M2 superior division. Right inferior M2 division occlusion in mid sylvian fissure (series 452, image 15). Anterior communicating artery: Patent. Posterior communicating arteries:  Patent.  Fetal left PCA. Posterior cerebral arteries:  Patent. Basilar artery:  Patent. Vertebral arteries:  Patent. No evidence of high-grade stenosis, large vessel occlusion, or aneurysm unless noted above. MRA NECK FINDINGS Aortic arch: Patent. Right common carotid artery: Patent. Right internal carotid artery: Patent. Right vertebral artery: Patent. Left common carotid artery: Patent. Left Internal carotid artery: Patent. Left Vertebral artery: Patent. There is no evidence of hemodynamically significant stenosis by NASCET criteria, occlusion, or aneurysm unless noted above. Partially included intracranial circulation demonstrates right inferior division M2 occlusion in mid sylvian fissure (series 11601 image 63). IMPRESSION: 1. Acute/early subacute infarction of right posterior insula cortex and small focus in frontal operculum. Additional subcentimeter cortical infarction in left parietal lobe. 2. Right M2 inferior division occlusion in mid sylvian fissure. 3. Otherwise patent circle of Willis. No large vessel occlusion, aneurysm, or significant stenosis is identified. 4. Patent carotid and vertebral arteries. No dissection, aneurysm, or significant stenosis by NASCET criteria is identified. 5. Background of mild chronic microvascular ischemic changes and mild parenchymal volume loss of the brain. These results will be called to the ordering  clinician or representative by the Radiologist Assistant, and communication documented in the PACS or zVision Dashboard. Electronically Signed   By: Kristine Garbe M.D.   On: 09/11/2017 01:19   Mr Brain Wo Contrast  Result Date: 09/11/2017 CLINICAL DATA:  66 y/o  F; episode of slurred speech. EXAM: MR HEAD WITHOUT CONTRAST MRA HEAD WITHOUT CONTRAST MRA OF THE NECK WITHOUT AND WITH CONTRAST TECHNIQUE: Multiplanar, multiecho pulse sequences of the brain and surrounding structures were obtained without intravenous contrast. Angiographic images of the neck were obtained using MRA technique without and with intravenous contrast. Angiographic images of the head were obtained using MRA technique without intravenous contrast. CONTRAST:  26mL MULTIHANCE GADOBENATE DIMEGLUMINE 529 MG/ML IV SOLN COMPARISON:  09/10/2017 CT head FINDINGS: MR HEAD FINDINGS Brain: Cortical reduced diffusion within the right posterior insula and subcentimeter focus and right frontal operculum compatible with acute/ early subacute infarction. Additionally there is a subcentimeter focus of reduced diffusion in the left parietal cortex (series 3, image 43). Partially empty sella turcica. Few scattered nonspecific foci of T2 FLAIR hyperintense signal abnormality in subcortical and periventricular white matter are compatible with mild chronic microvascular ischemic changes for age. Mild brain parenchymal volume loss. No focus of susceptibility hypointensity to indicate intracranial hemorrhage. No mass effect, no extra-axial collection, no hydrocephalus. Vascular: Curvilinear susceptibility hypointensity within the right posterior sylvian fissure in the region of infarction probably representing arterial thrombus. Skull and upper cervical spine: Normal marrow signal. Sinuses/Orbits: Left maxillary sinus mucous retention cyst. Otherwise negative. Other: None. MRA HEAD FINDINGS Internal carotid arteries:  Patent. Anterior cerebral arteries:   Patent. Middle cerebral arteries: Patent left MCA and distal circulation. Patent right M1 and right M2 superior  division. Right inferior M2 division occlusion in mid sylvian fissure (series 452, image 15). Anterior communicating artery: Patent. Posterior communicating arteries:  Patent.  Fetal left PCA. Posterior cerebral arteries:  Patent. Basilar artery:  Patent. Vertebral arteries:  Patent. No evidence of high-grade stenosis, large vessel occlusion, or aneurysm unless noted above. MRA NECK FINDINGS Aortic arch: Patent. Right common carotid artery: Patent. Right internal carotid artery: Patent. Right vertebral artery: Patent. Left common carotid artery: Patent. Left Internal carotid artery: Patent. Left Vertebral artery: Patent. There is no evidence of hemodynamically significant stenosis by NASCET criteria, occlusion, or aneurysm unless noted above. Partially included intracranial circulation demonstrates right inferior division M2 occlusion in mid sylvian fissure (series 11601 image 63). IMPRESSION: 1. Acute/early subacute infarction of right posterior insula cortex and small focus in frontal operculum. Additional subcentimeter cortical infarction in left parietal lobe. 2. Right M2 inferior division occlusion in mid sylvian fissure. 3. Otherwise patent circle of Willis. No large vessel occlusion, aneurysm, or significant stenosis is identified. 4. Patent carotid and vertebral arteries. No dissection, aneurysm, or significant stenosis by NASCET criteria is identified. 5. Background of mild chronic microvascular ischemic changes and mild parenchymal volume loss of the brain. These results will be called to the ordering clinician or representative by the Radiologist Assistant, and communication documented in the PACS or zVision Dashboard. Electronically Signed   By: Kristine Garbe M.D.   On: 09/11/2017 01:19   Mr Jodene Nam Head Wo Contrast  Result Date: 09/11/2017 CLINICAL DATA:  66 y/o  F; episode of  slurred speech. EXAM: MR HEAD WITHOUT CONTRAST MRA HEAD WITHOUT CONTRAST MRA OF THE NECK WITHOUT AND WITH CONTRAST TECHNIQUE: Multiplanar, multiecho pulse sequences of the brain and surrounding structures were obtained without intravenous contrast. Angiographic images of the neck were obtained using MRA technique without and with intravenous contrast. Angiographic images of the head were obtained using MRA technique without intravenous contrast. CONTRAST:  55mL MULTIHANCE GADOBENATE DIMEGLUMINE 529 MG/ML IV SOLN COMPARISON:  09/10/2017 CT head FINDINGS: MR HEAD FINDINGS Brain: Cortical reduced diffusion within the right posterior insula and subcentimeter focus and right frontal operculum compatible with acute/ early subacute infarction. Additionally there is a subcentimeter focus of reduced diffusion in the left parietal cortex (series 3, image 43). Partially empty sella turcica. Few scattered nonspecific foci of T2 FLAIR hyperintense signal abnormality in subcortical and periventricular white matter are compatible with mild chronic microvascular ischemic changes for age. Mild brain parenchymal volume loss. No focus of susceptibility hypointensity to indicate intracranial hemorrhage. No mass effect, no extra-axial collection, no hydrocephalus. Vascular: Curvilinear susceptibility hypointensity within the right posterior sylvian fissure in the region of infarction probably representing arterial thrombus. Skull and upper cervical spine: Normal marrow signal. Sinuses/Orbits: Left maxillary sinus mucous retention cyst. Otherwise negative. Other: None. MRA HEAD FINDINGS Internal carotid arteries:  Patent. Anterior cerebral arteries:  Patent. Middle cerebral arteries: Patent left MCA and distal circulation. Patent right M1 and right M2 superior division. Right inferior M2 division occlusion in mid sylvian fissure (series 452, image 15). Anterior communicating artery: Patent. Posterior communicating arteries:  Patent.   Fetal left PCA. Posterior cerebral arteries:  Patent. Basilar artery:  Patent. Vertebral arteries:  Patent. No evidence of high-grade stenosis, large vessel occlusion, or aneurysm unless noted above. MRA NECK FINDINGS Aortic arch: Patent. Right common carotid artery: Patent. Right internal carotid artery: Patent. Right vertebral artery: Patent. Left common carotid artery: Patent. Left Internal carotid artery: Patent. Left Vertebral artery: Patent. There is no evidence of hemodynamically significant  stenosis by NASCET criteria, occlusion, or aneurysm unless noted above. Partially included intracranial circulation demonstrates right inferior division M2 occlusion in mid sylvian fissure (series 11601 image 63). IMPRESSION: 1. Acute/early subacute infarction of right posterior insula cortex and small focus in frontal operculum. Additional subcentimeter cortical infarction in left parietal lobe. 2. Right M2 inferior division occlusion in mid sylvian fissure. 3. Otherwise patent circle of Willis. No large vessel occlusion, aneurysm, or significant stenosis is identified. 4. Patent carotid and vertebral arteries. No dissection, aneurysm, or significant stenosis by NASCET criteria is identified. 5. Background of mild chronic microvascular ischemic changes and mild parenchymal volume loss of the brain. These results will be called to the ordering clinician or representative by the Radiologist Assistant, and communication documented in the PACS or zVision Dashboard. Electronically Signed   By: Kristine Garbe M.D.   On: 09/11/2017 01:19     Echocardiogram (09/11/2017) Study Conclusions  - Left ventricle: The cavity size was normal. There was mild   concentric hypertrophy. Systolic function was normal. The   estimated ejection fraction was in the range of 55% to 60%. Wall   motion was normal; there were no regional wall motion   abnormalities. There was a reduced contribution of atrial   contraction to  ventricular filling, due to increased ventricular   diastolic pressure or atrial contractile dysfunction. Doppler   parameters are consistent with a reversible restrictive pattern,   indicative of decreased left ventricular diastolic compliance   and/or increased left atrial pressure (grade 3 diastolic   dysfunction). Doppler parameters are consistent with high   ventricular filling pressure. - Aortic valve: There was trivial regurgitation. - Mitral valve: There was mild regurgitation. - Left atrium: The atrium was severely dilated. - Right atrium: The atrium was severely dilated. - Pulmonary arteries: PA peak pressure: 78 mm Hg (S).  Impressions:  - The right ventricular systolic pressure was increased consistent   with severe pulmonary hypertension.   Subjective: No concerns. No symptoms  Discharge Exam: Vitals:   09/11/17 0700 09/11/17 1429  BP: (!) 163/78 (!) 167/87  Pulse: 67 70  Resp: 18 18  Temp: 99.2 F (37.3 C) 98.7 F (37.1 C)  SpO2: 96% 94%   Vitals:   09/11/17 0500 09/11/17 0600 09/11/17 0700 09/11/17 1429  BP: (!) 173/80 (!) 161/69 (!) 163/78 (!) 167/87  Pulse: 67 63 67 70  Resp: 17  18 18   Temp: 98.8 F (37.1 C)  99.2 F (37.3 C) 98.7 F (37.1 C)  TempSrc: Oral  Oral Oral  SpO2: 96%  96% 94%  Weight:      Height:        General: Pt is alert, awake, not in acute distress Cardiovascular: RRR, S1/S2 +, no rubs, no gallops Respiratory: CTA bilaterally, no wheezing, no rhonchi Abdominal: Soft, NT, ND, bowel sounds + Extremities: no edema, no cyanosis Neuro: 5/5 strength. Cn intact    The results of significant diagnostics from this hospitalization (including imaging, microbiology, ancillary and laboratory) are listed below for reference.     Microbiology: No results found for this or any previous visit (from the past 240 hour(s)).   Labs: BNP (last 3 results) No results for input(s): BNP in the last 8760 hours. Basic Metabolic  Panel: Recent Labs  Lab 09/10/17 1800 09/10/17 1815  NA 138 142  K 3.8 4.1  CL 104 104  CO2 24  --   GLUCOSE 128* 123*  BUN 18 22*  CREATININE 1.18* 1.20*  CALCIUM 9.3  --  Liver Function Tests: Recent Labs  Lab 09/10/17 1800  AST 33  ALT 30  ALKPHOS 105  BILITOT 0.5  PROT 8.0  ALBUMIN 3.3*   No results for input(s): LIPASE, AMYLASE in the last 168 hours. No results for input(s): AMMONIA in the last 168 hours. CBC: Recent Labs  Lab 09/10/17 1800 09/10/17 1815  WBC 5.8  --   NEUTROABS 3.1  --   HGB 11.9* 13.6  HCT 39.4 40.0  MCV 80.6  --   PLT 369  --    Cardiac Enzymes: No results for input(s): CKTOTAL, CKMB, CKMBINDEX, TROPONINI in the last 168 hours. BNP: Invalid input(s): POCBNP CBG: Recent Labs  Lab 09/10/17 1826  GLUCAP 122*   D-Dimer No results for input(s): DDIMER in the last 72 hours. Hgb A1c Recent Labs    09/11/17 0345  HGBA1C 6.8*   Lipid Profile Recent Labs    09/11/17 0345  CHOL 136  HDL 37*  LDLCALC 88  TRIG 53  CHOLHDL 3.7     SIGNED:   Cordelia Poche, MD [facility] 09/11/2017, 7:20 PM Pager [phone]  If 7PM-7AM, please contact night-coverage [email or web address] Password [email or web address]

## 2017-09-11 NOTE — Plan of Care (Signed)
  Education: Knowledge of disease or condition will improve 09/11/2017 0402 - Progressing by Irish Lack, RN   Self-Care: Ability to participate in self-care as condition permits will improve 09/11/2017 0402 - Progressing by Irish Lack, RN   Clinical Measurements: Ability to maintain clinical measurements within normal limits will improve 09/11/2017 0402 - Progressing by Irish Lack, RN   Safety: Ability to remain free from injury will improve 09/11/2017 0402 - Progressing by Irish Lack, RN

## 2017-09-11 NOTE — Evaluation (Signed)
Physical Therapy Evaluation Patient Details Name: [name] MRN: [id number] DOB: [birth year] Today's Date: 09/11/2017   History of Present Illness  66 y.o. female with history of atrial fibrillation, hypertension, hypertrophic cardiomyopathy was found to have right facial droop and slurred speech noticed by patient's family 09/10/17. On exam patient had some neglect on the right side.  INR was subtherapeutic. MRI revealed Acute/early subacute infarction of right posterior insula cortex and small focus in frontal operculum. Additional subcentimeter cortical infarction in left parietal lobe. as well as Right M2 inferior division occlusion in mid sylvian fissure.  Clinical Impression  Pt admitted with above diagnosis. Pt currently with functional limitations due to the deficits listed below (see PT Problem List). Pt is currently mod I for bed mobility, and transfers and supervision with ambulation of 200 feet without AD.  Pt will benefit from skilled PT to increase their independence and safety with mobility to allow discharge to the venue listed below.       Follow Up Recommendations No PT follow up;Supervision - Intermittent    Equipment Recommendations  None recommended by PT    Recommendations for Other Services       Precautions / Restrictions Precautions Precautions: None Restrictions Weight Bearing Restrictions: No      Mobility  Bed Mobility Overal bed mobility: Modified Independent             General bed mobility comments: HoB elevated and use of bed rails to come to EoB  Transfers Overall transfer level: Modified independent Equipment used: None             General transfer comment: good power up and steadying in standing  Ambulation/Gait Ambulation/Gait assistance: Supervision Ambulation Distance (Feet): 200 Feet Assistive device: None Gait Pattern/deviations: Step-through pattern;Decreased step length - right;Decreased step length -  left;Shuffle Gait velocity: slowed Gait velocity interpretation: Below normal speed for age/gender General Gait Details: supervision for safety, slow, steady cadence, no LoB     Modified Rankin (Stroke Patients Only) Modified Rankin (Stroke Patients Only) Pre-Morbid Rankin Score: No symptoms Modified Rankin: No symptoms     Balance Overall balance assessment: Needs assistance Sitting-balance support: No upper extremity supported;Feet supported Sitting balance-Leahy Scale: Good     Standing balance support: No upper extremity supported;During functional activity Standing balance-Leahy Scale: Fair                               Pertinent Vitals/Pain Pain Assessment: No/denies pain    Home Living Family/patient expects to be discharged to:: Private residence Living Arrangements: Children Available Help at Discharge: Family Type of Home: House Home Access: Stairs to enter Entrance Stairs-Rails: None Entrance Stairs-Number of Steps: 1 Home Layout: One level Home Equipment: Environmental consultant - 2 wheels;Bedside commode;Cane - single point;Tub bench      Prior Function Level of Independence: Independent         Comments: ambulates community distances without AD, drives infrequently     Hand Dominance   Dominant Hand: Right    Extremity/Trunk Assessment   Upper Extremity Assessment Upper Extremity Assessment: Defer to OT evaluation    Lower Extremity Assessment Lower Extremity Assessment: Overall WFL for tasks assessed       Communication   Communication: No difficulties  Cognition Arousal/Alertness: Awake/alert Behavior During Therapy: WFL for tasks assessed/performed Overall Cognitive Status: Within Functional Limits for tasks assessed  General Comments General comments (skin integrity, edema, etc.): Daughter present throughout session        Assessment/Plan    PT Assessment Patient needs  continued PT services  PT Problem List Decreased balance;Decreased mobility       PT Treatment Interventions Gait training;Stair training;Functional mobility training;Balance training;Patient/family education    PT Goals (Current goals can be found in the Care Plan section)  Acute Rehab PT Goals Patient Stated Goal: go home PT Goal Formulation: With patient/family Time For Goal Achievement: 09/25/17 Potential to Achieve Goals: Good    Frequency Min 3X/week    AM-PAC PT "6 Clicks" Daily Activity  Outcome Measure Difficulty turning over in bed (including adjusting bedclothes, sheets and blankets)?: None Difficulty moving from lying on back to sitting on the side of the bed? : None Difficulty sitting down on and standing up from a chair with arms (e.g., wheelchair, bedside commode, etc,.)?: None Help needed moving to and from a bed to chair (including a wheelchair)?: None Help needed walking in hospital room?: None Help needed climbing 3-5 steps with a railing? : A Little 6 Click Score: 23    End of Session Equipment Utilized During Treatment: Gait belt Activity Tolerance: Patient tolerated treatment well Patient left: in bed;with call bell/phone within reach;with family/visitor present Nurse Communication: Mobility status PT Visit Diagnosis: Other abnormalities of gait and mobility (R26.89);Difficulty in walking, not elsewhere classified (R26.2)    Time: 1030-1054 PT Time Calculation (min) (ACUTE ONLY): 24 min   Charges:   PT Evaluation $PT Eval Moderate Complexity: 1 Mod PT Treatments $Gait Training: 8-22 mins   PT G Codes:   PT G-Codes **NOT FOR INPATIENT CLASS** Functional Assessment Tool Used: AM-PAC 6 Clicks Basic Mobility Functional Limitation: Mobility: Walking and moving around Mobility: Walking and Moving Around Current Status (W2993): At least 1 percent but less than 20 percent impaired, limited or restricted Mobility: Walking and Moving Around Goal Status  [phone]): 0 percent impaired, limited or restricted    Benjamine Mola B. Migdalia Dk PT, DPT Acute Rehabilitation  [phone] Pager [phone]    [place] 09/11/2017, 2:12 PM

## 2017-09-11 NOTE — Progress Notes (Signed)
Patient discharged home with daughter [name].Patient and daughter verbalized understanding of discharge orders.Perscriptions and discharge paperwork given to patient.

## 2017-09-11 NOTE — Evaluation (Signed)
Occupational Therapy Evaluation and Discharge Patient Details Name: [name] MRN: [id number] DOB: [birth year] Today's Date: 09/11/2017    History of Present Illness 66 y.o. female with history of atrial fibrillation, hypertension, hypertrophic cardiomyopathy was found to have right facial droop and slurred speech noticed by patient's family 09/10/17. On exam patient had some neglect on the right side.  INR was subtherapeutic. MRI revealed Acute/early subacute infarction of right posterior insula cortex and small focus in frontal operculum. Additional subcentimeter cortical infarction in left parietal lobe. as well as Right M2 inferior division occlusion in mid sylvian fissure.   Clinical Impression   Pt reports she was independent with ADL PTA. Currently pt overall supervision for ADL and functional mobility. Pt planning to d/c home with 24/7 supervision from family. Pt reports she feels close to her functional baseline; daughter agrees. No further acute OT needs identified; signing off at this time. Please re-consult if needs change. Thank you for this referral.    Follow Up Recommendations  No OT follow up;Supervision - Intermittent    Equipment Recommendations  None recommended by OT    Recommendations for Other Services       Precautions / Restrictions Precautions Precautions: None Restrictions Weight Bearing Restrictions: No      Mobility Bed Mobility              General bed mobility comments: Pt sitting EOB upon arrival  Transfers Overall transfer level: Needs assistance Equipment used: None Transfers: Sit to/from Stand Sit to Stand: Supervision         General transfer comment: supervision for safety initially    Balance Overall balance assessment: No apparent balance deficits (not formally assessed)                              ADL either performed or assessed with clinical judgement   ADL Overall ADL's : Needs  assistance/impaired Eating/Feeding: Set up;Sitting   Grooming: Supervision/safety;Standing;Wash/dry hands   Upper Body Bathing: Set up;Sitting   Lower Body Bathing: Supervison/ safety;Sit to/from stand   Upper Body Dressing : Set up;Sitting   Lower Body Dressing: Supervision/safety;Sit to/from stand   Toilet Transfer: Supervision/safety;Ambulation;Regular Toilet   Toileting- Water quality scientist and Hygiene: Supervision/safety;Sit to/from stand   Tub/ Shower Transfer: Tub transfer;Supervision/safety;Ambulation   Functional mobility during ADLs: Supervision/safety       Vision Baseline Vision/History: Wears glasses Wears Glasses: At all times Patient Visual Report: No change from baseline Vision Assessment?: No apparent visual deficits     Perception     Praxis      Pertinent Vitals/Pain Pain Assessment: No/denies pain     Hand Dominance Right   Extremity/Trunk Assessment Upper Extremity Assessment Upper Extremity Assessment: Overall WFL for tasks assessed   Lower Extremity Assessment Lower Extremity Assessment: Defer to PT evaluation       Communication Communication Communication: No difficulties   Cognition Arousal/Alertness: Awake/alert Behavior During Therapy: WFL for tasks assessed/performed Overall Cognitive Status: Within Functional Limits for tasks assessed                                     General Comments  Daughter present throughout session    Exercises     Shoulder Instructions      Home Living Family/patient expects to be discharged to:: Private residence Living Arrangements: Children Available Help at Discharge: Family Type of Home:  House Home Access: Stairs to enter [employer] of Steps: 1 Entrance Stairs-Rails: None Home Layout: One level     Bathroom Shower/Tub: Tub only   Biochemist, clinical: Standard Bathroom Accessibility: Yes   Home Equipment: Environmental consultant - 2 wheels;Bedside commode;Cane - single  point;Tub bench          Prior Functioning/Environment Level of Independence: Independent        Comments: ambulates community distances without AD, drives infrequently        OT Problem List:        OT Treatment/Interventions:      OT Goals(Current goals can be found in the care plan section) Acute Rehab OT Goals Patient Stated Goal: go home OT Goal Formulation: All assessment and education complete, DC therapy  OT Frequency:     Barriers to D/C:            Co-evaluation              AM-PAC PT "6 Clicks" Daily Activity     Outcome Measure Help from another person eating meals?: None Help from another person taking care of personal grooming?: A Little Help from another person toileting, which includes using toliet, bedpan, or urinal?: A Little Help from another person bathing (including washing, rinsing, drying)?: A Little Help from another person to put on and taking off regular upper body clothing?: None Help from another person to put on and taking off regular lower body clothing?: A Little 6 Click Score: 20   End of Session Equipment Utilized During Treatment: Gait belt  Activity Tolerance: Patient tolerated treatment well Patient left: with call bell/phone within reach;with family/visitor present(sitting EOB)  OT Visit Diagnosis: Unsteadiness on feet (R26.81)                Time: 4818-5631 OT Time Calculation (min): 25 min Charges:  OT General Charges $OT Visit: 1 Visit OT Evaluation $OT Eval Low Complexity: 1 Low OT Treatments $Self Care/Home Management : 8-22 mins G-Codes: OT G-codes **NOT FOR INPATIENT CLASS** Functional Assessment Tool Used: Clinical judgement Functional Limitation: Self care Self Care Current Status (S9702): At least 1 percent but less than 20 percent impaired, limited or restricted Self Care Goal Status (O3785): At least 1 percent but less than 20 percent impaired, limited or restricted Self Care Discharge Status [phone]):  At least 1 percent but less than 20 percent impaired, limited or restricted   Mel Almond A. Ulice Brilliant, M.S., OTR/L Pager: [phone] 09/11/2017, 2:27 PM

## 2017-09-11 NOTE — Progress Notes (Signed)
ANTICOAGULATION CONSULT NOTE - Initial Consult  Pharmacy Consult for apixaban Indication: atrial fibrillation  No Known Allergies  Patient Measurements: Height: 5\' 3"  (160 cm) Weight: 207 lb 10.8 oz (94.2 kg) IBW/kg (Calculated) : 52.4   Vital Signs: Temp: 99.2 F (37.3 C) (12/27 0700) Temp Source: Oral (12/27 0700) BP: 163/78 (12/27 0700) Pulse Rate: 67 (12/27 0700)  Labs: Recent Labs    09/10/17 1800 09/10/17 1815  HGB 11.9* 13.6  HCT 39.4 40.0  PLT 369  --   APTT 34  --   LABPROT 17.8*  --   INR 1.48  --   CREATININE 1.18* 1.20*    Estimated Creatinine Clearance: 51 mL/min (A) (by C-G formula based on SCr of 1.2 mg/dL (H)).   Medical History: Past Medical History:  Diagnosis Date  . Atrial fibrillation ([place])   . Atrial flutter ([place])   . Hypertension   . Left ventricular hypertrophy    possibly hypertrophic cardiomyopathy  . Obesity     Medications:  Medications Prior to Admission  Medication Sig Dispense Refill Last Dose  . dofetilide (TIKOSYN) 250 MCG capsule Take 1 capsule (250 mcg total) by mouth 2 (two) times daily. 60 capsule 1 09/10/2017 at Unknown time  . hydrocortisone 1 % lotion Apply 1 application topically daily. For dry skin on face.    09/10/2017 at Unknown time  . losartan (COZAAR) 50 MG tablet Take 1.5 tablets (75 mg total) by mouth daily. 135 tablet 1 09/10/2017 at Unknown time  . magnesium oxide (MAGNESIUM-OXIDE) 400 (241.3 Mg) MG tablet Take 2 tablets (800 mg total) daily by mouth. 180 tablet 1 09/10/2017 at Unknown time  . metoprolol tartrate (LOPRESSOR) 25 MG tablet Take 1 tablet (25 mg total) by mouth 2 (two) times daily. 180 tablet 3 09/10/2017 at 11am  . OVER THE COUNTER MEDICATION Apply 1 application topically daily as needed. "athletes foot cream"    prn  . potassium chloride SA (K-DUR,KLOR-CON) 20 MEQ tablet Take 1 tablet (20 mEq total) by mouth daily. 90 tablet 1 09/10/2017 at Unknown time  . [DISCONTINUED] warfarin (COUMADIN) 5  MG tablet TAKE 1 TO 2 TABLETS BY MOUTH EVERY DAY OR AS DIRECTED BY COUMADIN (Patient taking differently: Take 5-10 mg by mouth See admin instructions. 5mg  on Mon/Fri 10mg  on Sun/Tues/Wed/Thurs/Sat) 60 tablet 3 09/09/2017 at 930pm  . metoprolol tartrate (LOPRESSOR) 25 MG tablet Take 1 tablet (25 mg total) by mouth 2 (two) times daily. (Patient not taking: Reported on 09/10/2017) 180 tablet 1 Not Taking at Unknown time  . potassium chloride SA (K-DUR,KLOR-CON) 20 MEQ tablet TAKE 1 TABLET BY MOUTH DAILY. (Patient not taking: Reported on 09/10/2017) 90 tablet 3 Not Taking at Unknown time   Scheduled:  . atorvastatin  80 mg Oral q1800  . dofetilide  250 mcg Oral BID  . enoxaparin (LOVENOX) injection  40 mg Subcutaneous Daily  . [START ON 09/12/2017] Influenza vac split quadrivalent PF  0.5 mL Intramuscular Tomorrow-1000  . losartan  75 mg Oral Daily  . magnesium oxide  800 mg Oral Daily  . metoprolol tartrate  25 mg Oral BID  . potassium chloride SA  20 mEq Oral Daily    Assessment: 66 yo female with CVA on coumadin PTA for afib. Plans to change to xarelto and pharmacy consulted to dose. -INR= 1.48, LFTs WNL, Hg= 13.6 -SCr= 1.2, CrCl ~ 70 (using TBW) -lovenox 40mg  [place] given at ~ 11am  Goal of Therapy:  Monitor platelets by anticoagulation protocol: Yes  Plan:  -Xarelto 20mg  po daily (will start with supper) -Will provide patient education  Hildred Laser, Pharm D 09/11/2017 2:21 PM

## 2017-09-11 NOTE — Progress Notes (Signed)
Pt is admitted to 3w27 via stretcher accompanied by RN and Dx'd with cva. Pt is A&OX3, NIH=0. VSS. Placed on telemetry. Skin intact, See epic for neuro assessment. Dr. Leonel Ramsay was made aware that MRI and MRA of  Head neck results were in Epic.

## 2017-09-11 NOTE — Progress Notes (Signed)
CM consulted for a benefits check for Xarelto and Eliquis:   S/W ERCILA @ [employer] # [phone] OPT- 3    1. ELIQUIS  5 MG  COVER- YES  CO-PAY- $ 8.35  TIER- 3 DRUG  PRIOR APPROVAL- NO    2. XARELTO 15 MG BID  COVER- YES  CO-PAY- $ 8.35  TIER- 3 DRUG  PRIOR APPROVAL- NO   PATIENT HAS : LOWE INCOME SUBSITYNE   PREFERRED PHARMACY : [facility]

## 2017-09-11 NOTE — Progress Notes (Signed)
Nutrition Brief Note  Patient identified on the Malnutrition Screening Tool (MST) Report.  Wt Readings from Last 15 Encounters:  09/11/17 207 lb 10.8 oz (94.2 kg)  03/03/17 208 lb 9.6 oz (94.6 kg)  01/21/17 207 lb 3.2 oz (94 kg)  10/23/15 203 lb 1.6 oz (92.1 kg)  09/19/14 203 lb (92.1 kg)  09/17/13 199 lb 1.9 oz (90.3 kg)  09/22/12 193 lb 12.8 oz (87.9 kg)  03/25/12 193 lb (87.5 kg)  09/27/11 174 lb (78.9 kg)  08/14/11 169 lb 12.8 oz (77 kg)  08/10/11 174 lb 12.8 oz (79.3 kg)  10/22/10 157 lb (71.2 kg)  12/29/08 171 lb (77.6 kg)  11/10/08 170 lb (77.1 kg)   Body mass index is 36.79 kg/m. Patient meets criteria for Obesity Class II based on current BMI.   Current diet order is Heart Healthy. Labs and medications reviewed. No recent weight loss.  If nutrition issues arise, please consult RD.   Arthur Holms, RD, LDN Pager #: [phone] After-Hours Pager #: [phone]

## 2017-09-11 NOTE — Progress Notes (Signed)
PT Cancellation Note  Patient Details Name: [name] MRN: [id number] DOB: 1950-12-07   Cancelled Treatment:    Reason Eval/Treat Not Completed: (P) Patient at procedure or test/unavailable PT will follow back for evaluation as able.  Onofre Gains B. Migdalia Dk PT, DPT Acute Rehabilitation  [phone] Pager [phone]     [place] 09/11/2017, 9:03 AM

## 2017-09-11 NOTE — Progress Notes (Signed)
Carotid artery duplex has been completed. 1-39% ICA stenosis bilaterally.  09/11/17 9:25 AM Carlos Levering RVT

## 2017-09-11 NOTE — Progress Notes (Signed)
  Echocardiogram 2D Echocardiogram has been performed.  [name] 09/11/2017, 10:14 AM

## 2017-09-11 NOTE — Progress Notes (Signed)
NEUROHOSPITALISTS STROKE TEAM - DAILY PROGRESS NOTE   ADMISSION HISTORY: [name] is a 66 y.o. female who feels that she is currently normal.  Her daughter states that when she came home around according to 6, she was slurring her words.  She did talk to her brother around 4 PM, was apparently relatively normal at that time. Her daughter states that she is also drooling from the left side.  They were concerned for stroke and brought her to the emergency department.  Her deficits had improved and the patient stated that she felt normal by the time of her arrival.  Neurology was consulted for TIA.  LKW: Possibly 4 PM tpa given?: no, rapidly improving deficits,  unclear time of onset  SUBJECTIVE (INTERVAL HISTORY) Daughter is at the bedside. Patient is found laying in bed in NAD. Overall she feels her condition is rapidly improving. Voices no new complaints. No new events reported overnight.  Patient denies any missed doses of Coumadin but admits to recently changing her diet.  OBJECTIVE Lab Results: CBC:  Recent Labs  Lab 09/10/17 1800 09/10/17 1815  WBC 5.8  --   HGB 11.9* 13.6  HCT 39.4 40.0  MCV 80.6  --   PLT 369  --    BMP: Recent Labs  Lab 09/10/17 1800 09/10/17 1815  NA 138 142  K 3.8 4.1  CL 104 104  CO2 24  --   GLUCOSE 128* 123*  BUN 18 22*  CREATININE 1.18* 1.20*  CALCIUM 9.3  --    Liver Function Tests:  Recent Labs  Lab 09/10/17 1800  AST 33  ALT 30  ALKPHOS 105  BILITOT 0.5  PROT 8.0  ALBUMIN 3.3*   Coagulation Studies:  Recent Labs    09/10/17 1800  APTT 34  INR 1.48   PHYSICAL EXAM Temp:  [98.6 F (37 C)-99.2 F (37.3 C)] 98.7 F (37.1 C) (12/27 1429) Pulse Rate:  [63-88] 70 (12/27 1429) Resp:  [16-29] 18 (12/27 1429) BP: (152-200)/(69-104) 167/87 (12/27 1429) SpO2:  [91 %-100 %] 94 % (12/27 1429) Weight:  [94.2 kg (207 lb 10.8 oz)] 94.2 kg (207 lb 10.8 oz) (12/27  0100) General - Well nourished, well developed, in no apparent distress Respiratory - Lungs clear bilaterally. No wheezing. Cardiovascular - Regular rate and rhythm  Neuro: Mental Status: Patient is awake, alert, oriented to person, place, month, year, and situation. Patient is able to give a clear and coherent history. No signs of aphasia , dysarthria or neglect Cranial Nerves: II: Visual Fields are full, though her answers are slow and less accurate in the left visual field. Pupils are equal, round, and reactive to light.   III,IV, VI: EOMI without ptosis or diploplia.  V: Facial sensation is symmetric to temperature VII: Facial movement is symmetric.  VIII: hearing is intact to voice X: Uvula elevates symmetrically XI: Shoulder shrug is symmetric. XII: tongue is midline without atrophy or fasciculations.  Motor: Tone is normal. Bulk is normal. 5/5 strength was present in all four extremities.  Sensory: She endorses symmetric sensation, though neglects to touch on the left side. Cerebellar: No clear ataxia  IMAGING: I have personally reviewed the radiological images below and agree with the radiology interpretations.  Ct Head Wo Contrast Result Date: 09/10/2017 IMPRESSION: Subcentimeter area of hypoattenuation in the right temporal lobe, adjacent to the sylvian fissure, with uncertain significance. This may represent an age-indeterminate lacunar infarct, or dilated perivascular space. No evidence of intracranial hemorrhage. Electronically Signed  By: Fidela Salisbury M.D.   On: 09/10/2017 18:31   Mr Virgel Paling and Neck Wo Contrast Result Date: 09/11/2017 IMPRESSION: 1. Acute/early subacute infarction of right posterior insula cortex and small focus in frontal operculum. Additional subcentimeter cortical infarction in left parietal lobe. 2. Right M2 inferior division occlusion in mid sylvian fissure. 3. Otherwise patent circle of Willis. No large vessel occlusion, aneurysm, or  significant stenosis is identified. 4. Patent carotid and vertebral arteries. No dissection, aneurysm, or significant stenosis by NASCET criteria is identified. 5. Background of mild chronic microvascular ischemic changes and mild parenchymal volume loss of the brain. These results will be called to the ordering clinician or representative by the Radiologist Assistant, and communication documented in the PACS or zVision Dashboard.   Echocardiogram:  Study Conclusions - Left ventricle: The cavity size was normal. There was mild   concentric hypertrophy. Systolic function was normal. The   estimated ejection fraction was in the range of 55% to 60%. Wall   motion was normal; there were no regional wall motion   abnormalities. There was a reduced contribution of atrial   contraction to ventricular filling, due to increased ventricular   diastolic pressure or atrial contractile dysfunction. Doppler   parameters are consistent with a reversible restrictive pattern,   indicative of decreased left ventricular diastolic compliance   and/or increased left atrial pressure (grade 3 diastolic   dysfunction). Doppler parameters are consistent with high   ventricular filling pressure. - Aortic valve: There was trivial regurgitation. - Mitral valve: There was mild regurgitation. - Left atrium: The atrium was severely dilated. - Right atrium: The atrium was severely dilated. - Pulmonary arteries: PA peak pressure: 78 mm Hg (S). Impressions: - The right ventricular systolic pressure was increased consistent   with severe pulmonary hypertension  B/L Carotid U/S:                                                 Final Interpretation: Right Carotid: There is evidence in the right ICA of a 1-39% stenosis. Left Carotid: There is evidence in the left ICA of a 1-39% stenosis. Vertebrals: Both vertebral arteries were patent with antegrade flow.     ASSESSMENT: [name] is a 66 y.o. female with PMH  of A. Fib/A flutter on Coumadin admitted with complaint of acute onset left-sided facial droop and weakness, MRI reveals:  Acute/early subacute infarction of right posterior insula cortex and small focus in frontal operculum. Subcentimeter cortical infarction in left parietal lobe Right M2 inferior division occlusion in mid sylvian fissure  Suspected Etiology: Cardioembolic from atrial fibrillation with subtherapeutic INR on Coumadin Resultant Symptoms: Dysarthria, left facial droop, left sided weakness Stroke Risk Factors: atrial fibrillation, hyperlipidemia and hypertension Other Stroke Risk Factors: Advanced age, former cigarette smoker,Obesity, Body mass index is 36.79 kg/m.   Outstanding Stroke Work-up Studies:    Workup completed  09/11/2017:   Neuro exam remained stable.  All admission symptoms have resolved.  Long discussion with patient and her daughter at bedside regarding compliance with Coumadin.  Decision made to switch to once a day dosing Xarelto.  Case management has determined it will cost $8.35  for the prescription.  Patient likely to be discharged home today.  Neurology follow-up in 6 weeks.  PLAN  09/11/2017: Discontinue Coumadin Start Xarelto Continue Statin Frequent neuro checks Telemetry monitoring  PT/OT/SLP Consult Case Management /MSW Ongoing aggressive stroke risk factor management Patient counseled to be compliant with her antithrombotic medications Patient counseled on Lifestyle modifications including, Diet, Exercise, and Stress Follow up with [facility] in 6 weeks  AFIB, CHRONIC: INR subtherapeutic on Coumadin, Patient denies any missed doses but has made recent changes to her diet. Coumadin discontinued 09/11/2017 After discussion with patient and daughter, Xarelto once a day dosing will be started per pharmacy 09/11/2017  HYPERTENSION: Stable, some elevated blood pressures noted overnight Permissive hypertension (OK if <220/120)  for 24-48 hours post stroke and then gradually normalized within 5-7 days. Long term BP goal normotensive. May slowly restart home B/P medications after 48 hours  HYPERLIPIDEMIA:    Component Value Date/Time   CHOL 136 09/11/2017 0345   TRIG 53 09/11/2017 0345   HDL 37 (L) 09/11/2017 0345   CHOLHDL 3.7 09/11/2017 0345   VLDL 11 09/11/2017 0345   LDLCALC 88 09/11/2017 0345  Home Meds:  NONE LDL  goal < 70 Started on  Lipitor to 40 mg daily Continue statin at discharge  DIABETES: Lab Results  Component Value Date   HGBA1C 6.8 (H) 09/11/2017  HgbA1c goal < 7.0 Currently on: Will need NovoLog Continue CBG monitoring and SSI to maintain glucose 140-180 mg/dl DM education   FORMER HX OF TOBACCO ABUSE  Recently quit Smoking cessation counseling provided Nicotine patch provided  OBESITY Obesity, Body mass index is 36.79 kg/m. Greater than/equal to 30  Other Active Problems: Principal Problem:   TIA (transient ischemic attack) Active Problems:   HYPERTENSION, BENIGN   Atrial fibrillation ([place])   Pulmonary hypertension, primary [facility])  Hospital day # 0 VTE prophylaxis: SCD's  Diet : Diet Heart Room service appropriate? Yes; Fluid consistency: Thin   FAMILY UPDATES: family at bedside  TEAM UPDATES: Mariel Aloe, MD     Prior Home Stroke Medications:  warfarin daily  Discharge Stroke Meds:  Please discharge patient on Xarelto (rivaroxaban) daily   Disposition: 01-Home or Self Care Therapy Recs:               None Home Equipment:         None Follow Up:  Follow-up Information    Garvin Fila, MD. Schedule an appointment as soon as possible for a visit in 6 week(s).   Specialties:  Neurology, Radiology Contact information: [address] [place] [address] [phone]          Alroy Dust, L.Marlou Sa, MD -PCP Follow up in 1-2 weeks     [facility] Stroke Neurology Team 09/11/2017 3:21 PM I have personally examined this patient,  reviewed notes, independently viewed imaging studies, participated in medical decision making and plan of care.ROS completed by me personally and pertinent positives fully documented  I have made any additions or clarifications directly to the above note. Agree with note above. The patient has presented with cardioembolic infarct secondary to atrial fibrillation despite been on warfarin and in compliant with subtherapeutic INR. Have discussed alternatives to warfarin and patient is willing to switch to Xarelto which she can afford. Agree with plan. She will be discharged home later today and follow-up as an outpatient in stroke clinic in 6 weeks. Long discussion with the patient and daughter and answered questions. Discussed with Dr.Nettey and nurse case manager Jacqualin Combes. Greater than 50% time during this 35 minute visit was spent on counseling and coordination of care about embolic strokes, atrial fibrillation discussion about anticoagulation  options and answering questions  Antony Contras, MD Medical Director [facility] Pager: [phone] 09/11/2017 3:56 PM  Neurology to sign-off at this time. Please call with any further questions or concerns. Thank you for this consultation.  To contact Stroke Continuity provider, please refer to [email or web address] After hours, contact General Neurology

## 2017-09-12 ENCOUNTER — Telehealth: Payer: Self-pay | Admitting: Pharmacist

## 2017-09-12 NOTE — Telephone Encounter (Signed)
Received Tykosin 243mcg from pt assistance program  #60 capsules x 3 (Exp Jun/2020). Patient to stop by clinic today and pick up medication at front desk

## 2017-09-23 DIAGNOSIS — I4891 Unspecified atrial fibrillation: Secondary | ICD-10-CM | POA: Diagnosis not present

## 2017-09-23 DIAGNOSIS — E669 Obesity, unspecified: Secondary | ICD-10-CM | POA: Diagnosis not present

## 2017-09-23 DIAGNOSIS — Z23 Encounter for immunization: Secondary | ICD-10-CM | POA: Diagnosis not present

## 2017-09-23 DIAGNOSIS — I639 Cerebral infarction, unspecified: Secondary | ICD-10-CM | POA: Diagnosis not present

## 2017-10-14 NOTE — Progress Notes (Signed)
HPI The patient presents for followup of atrial fibrillation and hypertrophic cardiomyopathy.  At the last visit her BP was increased and I increased her Cozaar.  A follow up echo demonstrated concentric LVH unchanged and pulmonary pressure was lower than previous.  Since I last saw her follow up echo suggested that the pulmonary pressure was more elevated than reported earlier in the year.    She was in the hospital with TIA in Dec.  Her INR was subtherapeutic.  I reviewed these records for this visit.    MRI significant for acute/early subacute infarct of right posterior insula cortex and small focus in frontal operculum, subcentimeter cortical infarction of left parietal lobe and right M2 inferior devision occlusion in mid sylvian fissure.  She had no residual from this and she was sent home on Xarelto.    The patient denies any new symptoms such as chest discomfort, neck or arm discomfort. There has been no new shortness of breath, PND or orthopnea. There has been no presyncope or syncope.  She is exercising and eating better.   She rare palpitations at night.    No Known Allergies  Current Outpatient Medications  Medication Sig Dispense Refill  . atorvastatin (LIPITOR) 40 MG tablet Take 1 tablet (40 mg total) by mouth daily at 6 PM. 30 tablet 0  . dofetilide (TIKOSYN) 250 MCG capsule Take 1 capsule (250 mcg total) by mouth 2 (two) times daily. 60 capsule 1  . hydrocortisone 1 % lotion Apply 1 application topically daily. For dry skin on face.     Marland Kitchen losartan (COZAAR) 100 MG tablet Take 1 tablet (100 mg total) by mouth daily. 90 tablet 3  . magnesium oxide (MAGNESIUM-OXIDE) 400 (241.3 Mg) MG tablet Take 2 tablets (800 mg total) daily by mouth. 180 tablet 1  . metoprolol tartrate (LOPRESSOR) 25 MG tablet Take 1 tablet (25 mg total) by mouth 2 (two) times daily. 180 tablet 3  . OVER THE COUNTER MEDICATION Apply 1 application topically daily as needed. "athletes foot cream"     . potassium  chloride SA (K-DUR,KLOR-CON) 20 MEQ tablet Take 1 tablet (20 mEq total) by mouth daily. 90 tablet 1  . rivaroxaban (XARELTO) 20 MG TABS tablet Take 1 tablet (20 mg total) by mouth daily with supper. 30 tablet 0   No current facility-administered medications for this visit.     Past Medical History:  Diagnosis Date  . Atrial fibrillation ([place])   . Atrial flutter ([place])   . Hypertension   . Left ventricular hypertrophy    possibly hypertrophic cardiomyopathy  . Obesity     Past Surgical History:  Procedure Laterality Date  . ABDOMINAL HYSTERECTOMY      ROS:  As stated in the HPI and negative for all other systems.  PHYSICAL EXAM BP (!) 164/94   Pulse 71   Ht 5\' 3"  (1.6 m)   Wt 202 lb (91.6 kg)   BMI 35.78 kg/m   GENERAL:  Well appearing NECK:  No jugular venous distention, waveform within normal limits, carotid upstroke brisk and symmetric, no bruits, no thyromegaly LUNGS:  Clear to auscultation bilaterally CHEST:  Unremarkable HEART:  PMI not displaced or sustained,S1 and S2 within normal limits, no S3, no S4, no clicks, no rubs, no murmurs ABD:  Flat, positive bowel sounds normal in frequency in pitch, no bruits, no rebound, no guarding, no midline pulsatile mass, no hepatomegaly, no splenomegaly EXT:  2 plus pulses throughout, mild leg edema, no cyanosis  no clubbing   EKG:  Normal sinus rhythm, rate 60 , axis within normal limits, QTC is slightly prolonged but reduced unchanged from previous.  There are  inferior and anterior deep T-wave inversions also unchanged.   10/16/2017  ASSESSMENT AND PLAN  Atrial fibrillation -   She is tolerating the Tikosyn.   QTC is unchanged.    Ms. Maliah Pyles has a CHA2DS2 - VASc score of 2 with a risk of stroke of 2%.  She will remain on the meds as listed  TIA - She continues with Xarelto.  NO change in therapy.   HYPERTENSION, BENIGN -  Her BP is not at target and she will increase Cozaar to 100 mg daily.  HYPERTROPHIC  CARDIOMYOPATHY  Pulmonary pressure was much more elevated on the echo in Dec.  I am going to continue to work on her BP.  She ultimately might need further study with right heart cath, plus or minus MRI.   PULMONARY HTN See above.  OVERWEIGHT I want her to lose 2 -3 lbs per month through lifestyle changes.  We discussed this again today.

## 2017-10-16 ENCOUNTER — Ambulatory Visit: Payer: PPO | Admitting: Cardiology

## 2017-10-16 ENCOUNTER — Encounter: Payer: Self-pay | Admitting: Cardiology

## 2017-10-16 VITALS — BP 164/94 | HR 71 | Ht 63.0 in | Wt 202.0 lb

## 2017-10-16 DIAGNOSIS — I272 Pulmonary hypertension, unspecified: Secondary | ICD-10-CM | POA: Diagnosis not present

## 2017-10-16 DIAGNOSIS — I10 Essential (primary) hypertension: Secondary | ICD-10-CM | POA: Diagnosis not present

## 2017-10-16 DIAGNOSIS — G459 Transient cerebral ischemic attack, unspecified: Secondary | ICD-10-CM

## 2017-10-16 DIAGNOSIS — I4891 Unspecified atrial fibrillation: Secondary | ICD-10-CM

## 2017-10-16 MED ORDER — LOSARTAN POTASSIUM 100 MG PO TABS: 100.0000 mg | ORAL_TABLET | Freq: Every day | ORAL | 3 refills | Status: DC

## 2017-10-16 NOTE — Patient Instructions (Signed)
Medication Instructions:  INCREASE- Losartan 100 mg daily  If you need a refill on your cardiac medications before your next appointment, please call your pharmacy.  Labwork: None Ordered  Testing/Procedures: None Ordered   Follow-Up: Your physician wants you to follow-up in: 2 Months.     Thank you for choosing [facility]!!

## 2017-10-30 ENCOUNTER — Other Ambulatory Visit: Payer: Self-pay

## 2017-10-30 MED ORDER — POTASSIUM CHLORIDE CRYS ER 20 MEQ PO TBCR: 20.0000 meq | EXTENDED_RELEASE_TABLET | Freq: Every day | ORAL | 1 refills | Status: DC

## 2017-10-30 MED ORDER — METOPROLOL TARTRATE 25 MG PO TABS: 25.0000 mg | ORAL_TABLET | Freq: Two times a day (BID) | ORAL | 3 refills | Status: DC

## 2017-11-12 ENCOUNTER — Encounter: Payer: Self-pay | Admitting: Neurology

## 2017-11-12 ENCOUNTER — Ambulatory Visit: Payer: PPO | Admitting: Neurology

## 2017-11-12 VITALS — BP 145/94 | HR 62 | Ht 63.0 in | Wt 198.8 lb

## 2017-11-12 DIAGNOSIS — E785 Hyperlipidemia, unspecified: Secondary | ICD-10-CM

## 2017-11-12 DIAGNOSIS — I10 Essential (primary) hypertension: Secondary | ICD-10-CM

## 2017-11-12 DIAGNOSIS — R7303 Prediabetes: Secondary | ICD-10-CM

## 2017-11-12 DIAGNOSIS — I480 Paroxysmal atrial fibrillation: Secondary | ICD-10-CM

## 2017-11-12 DIAGNOSIS — I63419 Cerebral infarction due to embolism of unspecified middle cerebral artery: Secondary | ICD-10-CM | POA: Diagnosis not present

## 2017-11-12 NOTE — Progress Notes (Signed)
[facility] [address] [place]. [place] [address] [phone]       OFFICE FOLLOW UP NOTE  [name] Date of Birth:  [birth year] Medical Record Number:  [id number]   Reason for Referral:  Stroke hospital follow up  CHIEF COMPLAINT:  Chief Complaint  Patient presents with  . Follow-up    Hospital Stroke follow up, Saw Dr Leonie Man in the hospital in Dece 2018    HPI: [name] is being seen today for initial visit in the office for stroke on 09/10/2017. History obtained from patient, daughter and chart review. Reviewed all radiology images and labs personally.  [name] is a 67 y.o. female with PMH of A. Fib/A flutter on Coumadin, hypertension, and hyperlipidemia admitted with complaint of acute onset left-sided facial droop, dysarthria and weakness.  CT scan reviewed and showed a subcentimeter area of hypoattenuation in the right temporal lobe which may represent an age indeterminate lacunar infarct.  TPA was administered due to rapid improvement of symptoms.  MRI scan reviewed and showed an acute/early subacute infarction of right posterior insular cortex and small focus frontal operculum with an dditional subcentimeter cortical infarction in left frontal lobe.  MRA of head/neck showed a right M2 inferior division occlusion in the mid sylvian fissure.  No large vessel occlusion was shown.  2D echo did show an EF of 55-60%.  Carotid bilateral ultrasound showed bilateral stenosis of 1-39%.  The strokes had a suspected etiology of cardioembolic atrial fibrillation with subtherapeutic INR on Coumadin.  INR level on admission was 1.4.  It was recommended the patient discontinue Coumadin and start Xarelto.  LDL 88 and was started on Lipitor 40 mg as patient was not on previous medication.  A1c at 6.8 was started on NovoLog in the hospital but was not discharged with any diabetic medication.  Patient discharged home without any residual deficits and in  stable condition.  Since discharge, patient has been doing well.  Patient was accompanied today with her daughter.  Patient continues to take Xarelto without any side effects of increased bleeding or bruising.  Patient continues to take Lipitor without any side effects of myalgias.  Patient's blood pressure today is on higher side of satisfactory at 145/94.  Per patient, cardiology recently increased losartan 200 mg daily and has an appointment with him at the end of March.  Patient was advised to monitor blood pressure at home.  Patient continues to be active as she was with her daughter and grandson who is 61 years old.  Patient has been trying to be healthier and attempts to lower cholesterol and A1c levels.  Patient denies new or worsening stroke/TIA symptoms.  ROS:   14 system review of systems performed and negative with exception of murmur, swelling in legs, joint pain, allergies, and restless legs  PMH:  Past Medical History:  Diagnosis Date  . Atrial fibrillation ([place])   . Atrial flutter ([place])   . Hypertension   . Left ventricular hypertrophy    possibly hypertrophic cardiomyopathy  . Obesity   . Stroke [facility])     PSH:  Past Surgical History:  Procedure Laterality Date  . ABDOMINAL HYSTERECTOMY      Social History:  Social History   Socioeconomic History  . Marital status: Divorced    Spouse name: Not on file  . Number of children: Not on file  . Years of education: Not on file  . Highest education level: Not on file  Social Needs  .  Financial resource strain: Not on file  . Food insecurity - worry: Not on file  . Food insecurity - inability: Not on file  . Transportation needs - medical: Not on file  . Transportation needs - non-medical: Not on file  Occupational History  . Not on file  Tobacco Use  . Smoking status: Former Smoker    Types: Cigarettes  . Smokeless tobacco: Never Used  Substance and Sexual Activity  . Alcohol use: No  . Drug use: No  . Sexual  activity: Not on file  Other Topics Concern  . Not on file  Social History Narrative  . Not on file    Family History:  Family History  Problem Relation Age of Onset  . Heart failure Mother   . Stroke Paternal Grandfather   . Diabetes Daughter     Medications:   Current Outpatient Medications on File Prior to Visit  Medication Sig Dispense Refill  . atorvastatin (LIPITOR) 40 MG tablet Take 1 tablet (40 mg total) by mouth daily at 6 PM. 30 tablet 0  . dofetilide (TIKOSYN) 250 MCG capsule Take 1 capsule (250 mcg total) by mouth 2 (two) times daily. 60 capsule 1  . hydrocortisone 1 % lotion Apply 1 application topically daily. For dry skin on face.     Marland Kitchen losartan (COZAAR) 100 MG tablet Take 1 tablet (100 mg total) by mouth daily. 90 tablet 3  . magnesium oxide (MAGNESIUM-OXIDE) 400 (241.3 Mg) MG tablet Take 2 tablets (800 mg total) daily by mouth. 180 tablet 1  . metoprolol tartrate (LOPRESSOR) 25 MG tablet Take 1 tablet (25 mg total) by mouth 2 (two) times daily. 180 tablet 3  . OVER THE COUNTER MEDICATION Apply 1 application topically daily as needed. "athletes foot cream"     . potassium chloride SA (K-DUR,KLOR-CON) 20 MEQ tablet Take 1 tablet (20 mEq total) by mouth daily. 90 tablet 1  . rivaroxaban (XARELTO) 20 MG TABS tablet Take 1 tablet (20 mg total) by mouth daily with supper. 30 tablet 0   No current facility-administered medications on file prior to visit.     Allergies:  No Known Allergies  Physical Exam  Vitals:   11/12/17 1111  BP: (!) 145/94  Pulse: 62  Weight: 198 lb 12.8 oz (90.2 kg)  Height: 5\' 3"  (1.6 m)   Body mass index is 35.22 kg/m. No exam data present  General: well developed, middle-aged African-American female, well nourished, seated, in no evident distress Head: head normocephalic and atraumatic.   Neck: supple with no carotid or supraclavicular bruits Cardiovascular: regular rate and rhythm, no murmurs Musculoskeletal: no deformity Skin:   no rash/petichiae Vascular:  Normal pulses all extremities  Neurologic Exam Mental Status: Awake and fully alert. Oriented to place and time. Recent and remote memory intact. Attention span, concentration and fund of knowledge appropriate. Mood and affect appropriate.  Cranial Nerves: Fundoscopic exam reveals sharp disc margins. Pupils equal, briskly reactive to light. Extraocular movements full without nystagmus. Visual fields full to confrontation. Hearing intact. Facial sensation intact. Face, tongue, palate moves normally and symmetrically.  Motor: Normal bulk and tone. Normal strength in all tested extremity muscles. Sensory.: intact to touch , pinprick , position and vibratory sensation.  Coordination: Rapid alternating movements normal in all extremities. Finger-to-nose and heel-to-shin performed accurately bilaterally. Gait and Station: Arises from chair without difficulty. Stance is normal. Gait demonstrates normal stride length and balance . Able to heel, toe and tandem walk without difficulty.  Reflexes: 1+  and symmetric. Toes downgoing.    NIHSS  0 Modified Rankin  0 HAS-BLED 3 CHA2DS2-VASc 7  Diagnostic Data (Labs, Imaging, Testing)  Ct Head Wo Contrast Result Date: 09/10/2017 IMPRESSION: Subcentimeter area of hypoattenuation in the right temporal lobe, adjacent to the sylvian fissure, with uncertain significance. This may represent an age-indeterminate lacunar infarct, or dilated perivascular space. No evidence of intracranial hemorrhage. Electronically Signed   By: Fidela Salisbury M.D.   On: 09/10/2017 18:31   Mr Virgel Paling and Neck Wo Contrast Result Date: 09/11/2017 IMPRESSION: 1. Acute/early subacute infarction of right posterior insula cortex and small focus in frontal operculum. Additional subcentimeter cortical infarction in left parietal lobe. 2. Right M2 inferior division occlusion in mid sylvian fissure. 3. Otherwise patent circle of Willis. No large vessel  occlusion, aneurysm, or significant stenosis is identified. 4. Patent carotid and vertebral arteries. No dissection, aneurysm, or significant stenosis by NASCET criteria is identified. 5. Background of mild chronic microvascular ischemic changes and mild parenchymal volume loss of the brain. These results will be called to the ordering clinician or representative by the Radiologist Assistant, and communication documented in the PACS or zVision Dashboard.   Echocardiogram:  Study Conclusions - Left ventricle: The cavity size was normal. There was mild concentric hypertrophy. Systolic function was normal. The estimated ejection fraction was in the range of 55% to 60%. Wall motion was normal; there were no regional wall motion abnormalities. There was a reduced contribution of atrial contraction to ventricular filling, due to increased ventricular diastolic pressure or atrial contractile dysfunction. Doppler parameters are consistent with a reversible restrictive pattern, indicative of decreased left ventricular diastolic compliance and/or increased left atrial pressure (grade 3 diastolic dysfunction). Doppler parameters are consistent with high ventricular filling pressure. - Aortic valve: There was trivial regurgitation. - Mitral valve: There was mild regurgitation. - Left atrium: The atrium was severely dilated. - Right atrium: The atrium was severely dilated. - Pulmonary arteries: PA peak pressure: 78 mm Hg (S). Impressions: - The right ventricular systolic pressure was increased consistent with severe pulmonary hypertension  B/L Carotid U/S:                                                 Final Interpretation: Right Carotid: There is evidence in the right ICA of a 1-39% stenosis. Left Carotid: There is evidence in the left ICA of a 1-39% stenosis. Vertebrals: Both vertebral arteries were patent with antegrade flow.     ASSESSMENT: Falana Clagg 67 y.o.  year old female here with right temporal MCA branch embolic stroke on 40/06/2724 secondary to atrial fibrillation with subtherapeutic INR on Coumadin.  Vascular risk factors include atrial fibrillation, hypertension, and hyperlipidemia.   PLAN: I had a long d/w patient about his recent stroke, risk for recurrent stroke/TIAs,atrial fibrillation personally independently reviewed imaging studies and stroke evaluation results and answered questions.Continue Xarelto (rivaroxaban) daily  for secondary stroke prevention and maintain strict control of hypertension with blood pressure goal below 130/90, diabetes with hemoglobin A1c goal below 6.5% and lipids with LDL cholesterol goal below 70 mg/dL. I also advised the patient to eat a healthy diet with plenty of whole grains, cereals, fruits and vegetables, exercise regularly and maintain ideal body weight Followup in the future with Janett Billow, NP in 6 months  -cardiology to manage A. fib and Xarelto  -Provided patient with  education on high cholesterol and proper diet management  Greater than 50% of time during this 25  minute visit was spent on counseling,explanation of diagnosis, planning of further management, discussion with patient and family and coordination of care.  Antony Contras, MD  [facility] [address] [place], [place] [address]  Phone [phone] Fax [phone]

## 2017-11-12 NOTE — Patient Instructions (Addendum)
I had a long d/w patient about his recent stroke, risk for recurrent stroke/TIAs, personally independently reviewed imaging studies and stroke evaluation results and answered questions.Continue Xarelto (rivaroxaban) daily  for secondary stroke prevention and maintain strict control of hypertension with blood pressure goal below 130/90, diabetes with hemoglobin A1c goal below 6.5% and lipids with LDL cholesterol goal below 70 mg/dL. I also advised the patient to eat a healthy diet with plenty of whole grains, cereals, fruits and vegetables, exercise regularly and maintain ideal body weight Followup in the future with Janett Billow, NP in 6 months  -continue to follow up with cardiologist for atrial fibrillation management  -eat healthy diet as provided below and continue to exercise  -follow up in 6 months or call earlier if needed   High Cholesterol High cholesterol is a condition in which the blood has high levels of a white, waxy, fat-like substance (cholesterol). The human body needs small amounts of cholesterol. The liver makes all the cholesterol that the body needs. Extra (excess) cholesterol comes from the food that we eat. Cholesterol is carried from the liver by the blood through the blood vessels. If you have high cholesterol, deposits (plaques) may build up on the walls of your blood vessels (arteries). Plaques make the arteries narrower and stiffer. Cholesterol plaques increase your risk for heart attack and stroke. Work with your health care provider to keep your cholesterol levels in a healthy range. What increases the risk? This condition is more likely to develop in people who:  Eat foods that are high in animal fat (saturated fat) or cholesterol.  Are overweight.  Are not getting enough exercise.  Have a family history of high cholesterol.  What are the signs or symptoms? There are no symptoms of this condition. How is this diagnosed? This condition may be diagnosed from the  results of a blood test.  If you are older than age 72, your health care provider may check your cholesterol every 4-6 years.  You may be checked more often if you already have high cholesterol or other risk factors for heart disease.  The blood test for cholesterol measures:  "Bad" cholesterol (LDL cholesterol). This is the main type of cholesterol that causes heart disease. The desired level for LDL is less than 100.  "Good" cholesterol (HDL cholesterol). This type helps to protect against heart disease by cleaning the arteries and carrying the LDL away. The desired level for HDL is 60 or higher.  Triglycerides. These are fats that the body can store or burn for energy. The desired number for triglycerides is lower than 150.  Total cholesterol. This is a measure of the total amount of cholesterol in your blood, including LDL cholesterol, HDL cholesterol, and triglycerides. A healthy number is less than 200.  How is this treated? This condition is treated with diet changes, lifestyle changes, and medicines. Diet changes  This may include eating more whole grains, fruits, vegetables, nuts, and fish.  This may also include cutting back on red meat and foods that have a lot of added sugar. Lifestyle changes  Changes may include getting at least 40 minutes of aerobic exercise 3 times a week. Aerobic exercises include walking, biking, and swimming. Aerobic exercise along with a healthy diet can help you maintain a healthy weight.  Changes may also include quitting smoking. Medicines  Medicines are usually given if diet and lifestyle changes have failed to reduce your cholesterol to healthy levels.  Your health care provider may prescribe a statin  medicine. Statin medicines have been shown to reduce cholesterol, which can reduce the risk of heart disease. Follow these instructions at home: Eating and drinking  If told by your health care provider:  Eat chicken (without skin), fish,  veal, shellfish, ground [place] breast, and round or loin cuts of red meat.  Do not eat fried foods or fatty meats, such as hot dogs and salami.  Eat plenty of fruits, such as apples.  Eat plenty of vegetables, such as broccoli, potatoes, and carrots.  Eat beans, peas, and lentils.  Eat grains such as barley, rice, couscous, and bulgur wheat.  Eat pasta without cream sauces.  Use skim or nonfat milk, and eat low-fat or nonfat yogurt and cheeses.  Do not eat or drink whole milk, cream, ice cream, egg yolks, or hard cheeses.  Do not eat stick margarine or tub margarines that contain trans fats (also called partially hydrogenated oils).  Do not eat saturated tropical oils, such as coconut oil and palm oil.  Do not eat cakes, cookies, crackers, or other baked goods that contain trans fats.  General instructions  Exercise as directed by your health care provider. Increase your activity level with activities such as gardening, walking, and taking the stairs.  Take over-the-counter and prescription medicines only as told by your health care provider.  Do not use any products that contain nicotine or tobacco, such as cigarettes and e-cigarettes. If you need help quitting, ask your health care provider.  Keep all follow-up visits as told by your health care provider. This is important. Contact a health care provider if:  You are struggling to maintain a healthy diet or weight.  You need help to start on an exercise program.  You need help to stop smoking. Get help right away if:  You have chest pain.  You have trouble breathing. This information is not intended to replace advice given to you by your health care provider. Make sure you discuss any questions you have with your health care provider. Document Released: 09/02/2005 Document Revised: 03/30/2016 Document Reviewed: 03/02/2016 Elsevier Interactive Patient Education  2018 [employer].     Diabetes Mellitus and  Nutrition When you have diabetes (diabetes mellitus), it is very important to have healthy eating habits because your blood sugar (glucose) levels are greatly affected by what you eat and drink. Eating healthy foods in the appropriate amounts, at about the same times every day, can help you:  Control your blood glucose.  Lower your risk of heart disease.  Improve your blood pressure.  Reach or maintain a healthy weight.  Every person with diabetes is different, and each person has different needs for a meal plan. Your health care provider may recommend that you work with a diet and nutrition specialist (dietitian) to make a meal plan that is best for you. Your meal plan may vary depending on factors such as:  The calories you need.  The medicines you take.  Your weight.  Your blood glucose, blood pressure, and cholesterol levels.  Your activity level.  Other health conditions you have, such as heart or kidney disease.  How do carbohydrates affect me? Carbohydrates affect your blood glucose level more than any other type of food. Eating carbohydrates naturally increases the amount of glucose in your blood. Carbohydrate counting is a method for keeping track of how many carbohydrates you eat. Counting carbohydrates is important to keep your blood glucose at a healthy level, especially if you use insulin or take certain oral  diabetes medicines. It is important to know how many carbohydrates you can safely have in each meal. This is different for every person. Your dietitian can help you calculate how many carbohydrates you should have at each meal and for snack. Foods that contain carbohydrates include:  Bread, cereal, rice, pasta, and crackers.  Potatoes and corn.  Peas, beans, and lentils.  Milk and yogurt.  Fruit and juice.  Desserts, such as cakes, cookies, ice cream, and candy.  How does alcohol affect me? Alcohol can cause a sudden decrease in blood glucose  (hypoglycemia), especially if you use insulin or take certain oral diabetes medicines. Hypoglycemia can be a life-threatening condition. Symptoms of hypoglycemia (sleepiness, dizziness, and confusion) are similar to symptoms of having too much alcohol. If your health care provider says that alcohol is safe for you, follow these guidelines:  Limit alcohol intake to no more than 1 drink per day for nonpregnant women and 2 drinks per day for men. One drink equals 12 oz of beer, 5 oz of wine, or 1 oz of hard liquor.  Do not drink on an empty stomach.  Keep yourself hydrated with water, diet soda, or unsweetened iced tea.  Keep in mind that regular soda, juice, and other mixers may contain a lot of sugar and must be counted as carbohydrates.  What are tips for following this plan? Reading food labels  Start by checking the serving size on the label. The amount of calories, carbohydrates, fats, and other nutrients listed on the label are based on one serving of the food. Many foods contain more than one serving per package.  Check the total grams (g) of carbohydrates in one serving. You can calculate the number of servings of carbohydrates in one serving by dividing the total carbohydrates by 15. For example, if a food has 30 g of total carbohydrates, it would be equal to 2 servings of carbohydrates.  Check the number of grams (g) of saturated and trans fats in one serving. Choose foods that have low or no amount of these fats.  Check the number of milligrams (mg) of sodium in one serving. Most people should limit total sodium intake to less than 2,300 mg per day.  Always check the nutrition information of foods labeled as "low-fat" or "nonfat". These foods may be higher in added sugar or refined carbohydrates and should be avoided.  Talk to your dietitian to identify your daily goals for nutrients listed on the label. Shopping  Avoid buying canned, premade, or processed foods. These foods tend  to be high in fat, sodium, and added sugar.  Shop around the outside edge of the grocery store. This includes fresh fruits and vegetables, bulk grains, fresh meats, and fresh dairy. Cooking  Use low-heat cooking methods, such as baking, instead of high-heat cooking methods like deep frying.  Cook using healthy oils, such as olive, canola, or sunflower oil.  Avoid cooking with butter, cream, or high-fat meats. Meal planning  Eat meals and snacks regularly, preferably at the same times every day. Avoid going long periods of time without eating.  Eat foods high in fiber, such as fresh fruits, vegetables, beans, and whole grains. Talk to your dietitian about how many servings of carbohydrates you can eat at each meal.  Eat 4-6 ounces of lean protein each day, such as lean meat, chicken, fish, eggs, or tofu. 1 ounce is equal to 1 ounce of meat, chicken, or fish, 1 egg, or 1/4 cup of tofu.  Eat  some foods each day that contain healthy fats, such as avocado, nuts, seeds, and fish. Lifestyle   Check your blood glucose regularly.  Exercise at least 30 minutes 5 or more days each week, or as told by your health care provider.  Take medicines as told by your health care provider.  Do not use any products that contain nicotine or tobacco, such as cigarettes and e-cigarettes. If you need help quitting, ask your health care provider.  Work with a Social worker or diabetes educator to identify strategies to manage stress and any emotional and social challenges. What are some questions to ask my health care provider?  Do I need to meet with a diabetes educator?  Do I need to meet with a dietitian?  What number can I call if I have questions?  When are the best times to check my blood glucose? Where to find more information:  American Diabetes Association: diabetes.org/food-and-fitness/food  Academy of Nutrition and Dietetics:  PokerClues.dk  [employer] of Diabetes and Digestive and Kidney Diseases (NIH): ContactWire.be Summary  A healthy meal plan will help you control your blood glucose and maintain a healthy lifestyle.  Working with a diet and nutrition specialist (dietitian) can help you make a meal plan that is best for you.  Keep in mind that carbohydrates and alcohol have immediate effects on your blood glucose levels. It is important to count carbohydrates and to use alcohol carefully. This information is not intended to replace advice given to you by your health care provider. Make sure you discuss any questions you have with your health care provider. Document Released: 05/30/2005 Document Revised: 10/07/2016 Document Reviewed: 10/07/2016 Elsevier Interactive Patient Education  [employer].

## 2017-12-07 NOTE — Progress Notes (Signed)
HPI The patient presents for followup of atrial fibrillation and hypertrophic cardiomyopathy.  At the last visit her BP was increased and I increased her Cozaar.  Since then she has been keeping a blood pressure diary and her blood pressure has been excellent.  She is [place] walking [place] the mall.  She does about 4000 steps daily and she thinks her breathing is improved.  She is not having any shortness of breath or chest pain.  She is felt some rare palpitations that might be atrial fibrillation but these have not been sustained.  She is had no presyncope or syncope.  She denies any chest pressure, neck or arm discomfort.  She was in the hospital last year with questionable TIA symptoms but her INR was subtherapeutic.  She was sent home on Xarelto at that time.  She is tolerated this well without any bleeding issues.   No Known Allergies  Current Outpatient Medications  Medication Sig Dispense Refill  . atorvastatin (LIPITOR) 40 MG tablet Take 1 tablet (40 mg total) by mouth daily at 6 PM. 30 tablet 0  . dofetilide (TIKOSYN) 250 MCG capsule Take 1 capsule (250 mcg total) by mouth 2 (two) times daily. 60 capsule 1  . hydrocortisone 1 % lotion Apply 1 application topically daily. For dry skin on face.     Marland Kitchen losartan (COZAAR) 100 MG tablet Take 1 tablet (100 mg total) by mouth daily. 90 tablet 3  . magnesium oxide (MAGNESIUM-OXIDE) 400 (241.3 Mg) MG tablet Take 2 tablets (800 mg total) daily by mouth. 180 tablet 1  . metoprolol tartrate (LOPRESSOR) 25 MG tablet Take 1 tablet (25 mg total) by mouth 2 (two) times daily. 180 tablet 3  . OVER THE COUNTER MEDICATION Apply 1 application topically daily as needed. "athletes foot cream"     . potassium chloride SA (K-DUR,KLOR-CON) 20 MEQ tablet Take 1 tablet (20 mEq total) by mouth daily. 90 tablet 1  . rivaroxaban (XARELTO) 20 MG TABS tablet Take 1 tablet (20 mg total) by mouth daily with supper. 30 tablet 0   No current facility-administered  medications for this visit.     Past Medical History:  Diagnosis Date  . Atrial fibrillation ([place])   . Atrial flutter ([place])   . Hypertension   . Left ventricular hypertrophy    possibly hypertrophic cardiomyopathy  . Obesity   . Stroke [facility])     Past Surgical History:  Procedure Laterality Date  . ABDOMINAL HYSTERECTOMY      ROS:  As stated in the HPI and negative for all other systems.  PHYSICAL EXAM There were no vitals taken for this visit.  GENERAL:  Well appearing NECK:  No jugular venous distention, waveform within normal limits, carotid upstroke brisk and symmetric, no bruits, no thyromegaly LUNGS:  Clear to auscultation bilaterally CHEST:  Unremarkable HEART:  PMI not displaced or sustained,S1 and S2 within normal limits, no S3, no S4, no clicks, no rubs, no murmurs ABD:  Flat, positive bowel sounds normal in frequency in pitch, no bruits, no rebound, no guarding, no midline pulsatile mass, no hepatomegaly, no splenomegaly EXT:  2 plus pulses throughout, no edema, no cyanosis no clubbing   EKG:  Normal sinus rhythm, rate 59, axis within normal limits, QTC is slightly prolonged but reduced unchanged from previous.  There are  inferior and anterior deep T-wave inversions also unchanged.   12/07/2017  ASSESSMENT AND PLAN  Atrial fibrillation -   She is tolerating the Tikosyn.  QTC is 471.    [name] has a CHA2DS2 - VASc score of 2 with a risk of stroke of 2%.  She will remain on meds as listed.   TIA - She continues with Xarelto.   No change in therapy.   HYPERTENSION, BENIGN -  Her blood pressure is much improved.  No change in therapy is planned.  HYPERTROPHIC CARDIOMYOPATHY  Pulmonary pressure was much more elevated on the echo in Dec.    I will repeat an echo in June.  If her pulmonary pressures are still elevated I will consider further evaluation with MRI or right heart cath.  PULMONARY HTN As above.   OVERWEIGHT Her weights have come slowly  down since her peak.

## 2017-12-10 ENCOUNTER — Ambulatory Visit: Payer: PPO | Admitting: Cardiology

## 2017-12-10 ENCOUNTER — Encounter: Payer: Self-pay | Admitting: Cardiology

## 2017-12-10 VITALS — BP 142/78 | HR 72 | Ht 63.0 in | Wt 198.0 lb

## 2017-12-10 DIAGNOSIS — I517 Cardiomegaly: Secondary | ICD-10-CM | POA: Diagnosis not present

## 2017-12-10 DIAGNOSIS — I272 Pulmonary hypertension, unspecified: Secondary | ICD-10-CM | POA: Diagnosis not present

## 2017-12-10 DIAGNOSIS — Z79899 Other long term (current) drug therapy: Secondary | ICD-10-CM | POA: Diagnosis not present

## 2017-12-10 DIAGNOSIS — I480 Paroxysmal atrial fibrillation: Secondary | ICD-10-CM | POA: Diagnosis not present

## 2017-12-10 NOTE — Patient Instructions (Signed)
Medication Instructions:  Continue current medications  If you need a refill on your cardiac medications before your next appointment, please call your pharmacy.  Labwork: BMP and Magnesium HERE IN OUR OFFICE AT [facility]  Take the provided lab slips for you to take with you to the lab for you blood draw.   You will NOT need to fast   Testing/Procedures: Your physician has requested that you have an echocardiogram in June. Echocardiography is a painless test that uses sound waves to create images of your heart. It provides your doctor with information about the size and shape of your heart and how well your heart's chambers and valves are working. This procedure takes approximately one hour. There are no restrictions for this procedure.   Follow-Up: Your physician wants you to follow-up in: June after Echo.     Thank you for choosing [facility]!!

## 2017-12-11 LAB — BASIC METABOLIC PANEL
BUN/Creatinine Ratio: 14 (ref 12–28)
BUN: 15 mg/dL (ref 8–27)
CALCIUM: 9.5 mg/dL (ref 8.7–10.3)
CHLORIDE: 105 mmol/L (ref 96–106)
CO2: 23 mmol/L (ref 20–29)
Creatinine, Ser: 1.06 mg/dL — ABNORMAL HIGH (ref 0.57–1.00)
GFR calc Af Amer: 64 mL/min/{1.73_m2} (ref >59)
GFR calc non Af Amer: 55 mL/min/{1.73_m2} — ABNORMAL LOW (ref >59)
Glucose: 90 mg/dL (ref 65–99)
Potassium: 4.6 mmol/L (ref 3.5–5.2)
Sodium: 141 mmol/L (ref 134–144)

## 2017-12-11 LAB — MAGNESIUM: MAGNESIUM: 2.1 mg/dL (ref 1.6–2.3)

## 2017-12-26 ENCOUNTER — Other Ambulatory Visit: Payer: Self-pay | Admitting: Cardiology

## 2017-12-26 MED ORDER — ATORVASTATIN CALCIUM 40 MG PO TABS: 40.0000 mg | ORAL_TABLET | Freq: Every day | ORAL | 3 refills | Status: DC

## 2017-12-26 MED ORDER — RIVAROXABAN 20 MG PO TABS: 20.0000 mg | ORAL_TABLET | Freq: Every day | ORAL | 0 refills | Status: DC

## 2017-12-26 NOTE — Telephone Encounter (Signed)
Rx(s) sent to pharmacy electronically.  

## 2017-12-26 NOTE — Telephone Encounter (Signed)
New Message     *STAT* If patient is at the pharmacy, call can be transferred to refill team.   1. Which medications need to be refilled? (please list name of each medication and dose if known)  rivaroxaban (XARELTO) 20 MG TABS tablet Take 1 tablet (20 mg total) by mouth daily with supper.   atorvastatin (LIPITOR) 40 MG tablet Take 1 tablet (40 mg total) by mouth daily at 6 PM.           2. Which pharmacy/location (including street and city if local pharmacy) is medication to be sent to?  [facility] [address]   3. Do they need a 30 day or 90 day supply? [place]

## 2018-02-20 DIAGNOSIS — I4891 Unspecified atrial fibrillation: Secondary | ICD-10-CM | POA: Diagnosis not present

## 2018-02-20 DIAGNOSIS — I639 Cerebral infarction, unspecified: Secondary | ICD-10-CM | POA: Diagnosis not present

## 2018-02-20 DIAGNOSIS — E669 Obesity, unspecified: Secondary | ICD-10-CM | POA: Diagnosis not present

## 2018-02-20 DIAGNOSIS — I10 Essential (primary) hypertension: Secondary | ICD-10-CM | POA: Diagnosis not present

## 2018-03-03 ENCOUNTER — Other Ambulatory Visit: Payer: Self-pay

## 2018-03-03 ENCOUNTER — Ambulatory Visit (HOSPITAL_COMMUNITY): Payer: PPO | Attending: Cardiovascular Disease

## 2018-03-03 DIAGNOSIS — I4891 Unspecified atrial fibrillation: Secondary | ICD-10-CM | POA: Diagnosis not present

## 2018-03-03 DIAGNOSIS — I119 Hypertensive heart disease without heart failure: Secondary | ICD-10-CM | POA: Diagnosis not present

## 2018-03-03 DIAGNOSIS — E669 Obesity, unspecified: Secondary | ICD-10-CM | POA: Insufficient documentation

## 2018-03-03 DIAGNOSIS — I4892 Unspecified atrial flutter: Secondary | ICD-10-CM | POA: Insufficient documentation

## 2018-03-03 DIAGNOSIS — Z8673 Personal history of transient ischemic attack (TIA), and cerebral infarction without residual deficits: Secondary | ICD-10-CM | POA: Insufficient documentation

## 2018-03-03 DIAGNOSIS — I071 Rheumatic tricuspid insufficiency: Secondary | ICD-10-CM | POA: Diagnosis not present

## 2018-03-03 DIAGNOSIS — Z6835 Body mass index (BMI) 35.0-35.9, adult: Secondary | ICD-10-CM | POA: Diagnosis not present

## 2018-03-03 DIAGNOSIS — I517 Cardiomegaly: Secondary | ICD-10-CM

## 2018-03-16 ENCOUNTER — Other Ambulatory Visit: Payer: Self-pay | Admitting: Cardiology

## 2018-03-20 ENCOUNTER — Other Ambulatory Visit: Payer: Self-pay | Admitting: Cardiology

## 2018-03-28 NOTE — Progress Notes (Signed)
HPI The patient presents for followup of atrial fibrillation and hypertrophic cardiomyopathy.  At the last visit her pulmonary pressures were lower on echo.  She has been exercising and continues to lose weight.  Her blood pressure diary is excellent.  She says she feels better.  She is not having any significant shortness of breath and denies PND or orthopnea.  She is had no palpitations, presyncope or syncope.  She is had no chest pressure, neck or arm discomfort.   Allergies  Allergen Reactions  . Latex Itching    Current Outpatient Medications  Medication Sig Dispense Refill  . atorvastatin (LIPITOR) 40 MG tablet Take 1 tablet (40 mg total) by mouth daily at 6 PM. 90 tablet 3  . dofetilide (TIKOSYN) 250 MCG capsule Take 1 capsule (250 mcg total) by mouth 2 (two) times daily. 60 capsule 1  . hydrocortisone 1 % lotion Apply 1 application topically daily. For dry skin on face.     Marland Kitchen losartan (COZAAR) 100 MG tablet Take 1 tablet (100 mg total) by mouth daily. 90 tablet 3  . magnesium oxide (MAG-OX) 400 MG tablet TAKE 2 TABLETS DAILY BY MOUTH. 180 tablet 1  . metoprolol tartrate (LOPRESSOR) 25 MG tablet Take 1 tablet (25 mg total) by mouth 2 (two) times daily. 180 tablet 3  . OVER THE COUNTER MEDICATION Apply 1 application topically daily as needed. "athletes foot cream"     . potassium chloride SA (K-DUR,KLOR-CON) 20 MEQ tablet Take 1 tablet (20 mEq total) by mouth daily. 90 tablet 1  . XARELTO 20 MG TABS tablet TAKE 1 TABLET (20 MG TOTAL) BY MOUTH DAILY WITH SUPPER. 90 tablet 1   No current facility-administered medications for this visit.     Past Medical History:  Diagnosis Date  . Atrial fibrillation ([place])   . Atrial flutter ([place])   . Hypertension   . Left ventricular hypertrophy    possibly hypertrophic cardiomyopathy  . Obesity   . Stroke [facility])     Past Surgical History:  Procedure Laterality Date  . ABDOMINAL HYSTERECTOMY      ROS:  As stated in the HPI and  negative for all other systems.  PHYSICAL EXAM BP 126/82   Pulse 74   Ht 5\' 3"  (1.6 m)   Wt 192 lb (87.1 kg)   SpO2 99%   BMI 34.01 kg/m   GENERAL:  Well appearing NECK:  No jugular venous distention, waveform within normal limits, carotid upstroke brisk and symmetric, no bruits, no thyromegaly LUNGS:  Clear to auscultation bilaterally CHEST:  Unremarkable HEART:  PMI not displaced or sustained,S1 and S2 within normal limits, no S3, no S4, no clicks, no rubs, no murmurs ABD:  Flat, positive bowel sounds normal in frequency in pitch, no bruits, no rebound, no guarding, no midline pulsatile mass, no hepatomegaly, no splenomegaly EXT:  2 plus pulses throughout, no edema, no cyanosis no clubbing   EKG:  NA  ASSESSMENT AND PLAN  Atrial fibrillation -   She is tolerating the Tikosyn.  .    [name] has a CHA2DS2 - VASc score of 2 with a risk of stroke of 2%.  She will remain on meds as listed.   TIA - She has had no further events.  She remains on Xarelto.   HYPERTENSION, BENIGN -  Her blood pressure is much improved and at target.  No change in therapy.   HYPERTROPHIC CARDIOMYOPATHY  This was stable and I think likely  related to her previous difficult to control HTN.  No change in therapy.   PULMONARY HTN Her pulmonary pressures were lower on echo in June.  I will follow clinically.   OVERWEIGHT I am proud of her weight loss.

## 2018-03-30 ENCOUNTER — Encounter: Payer: Self-pay | Admitting: Cardiology

## 2018-03-30 ENCOUNTER — Ambulatory Visit: Payer: PPO | Admitting: Cardiology

## 2018-03-30 VITALS — BP 126/82 | HR 74 | Ht 63.0 in | Wt 192.0 lb

## 2018-03-30 DIAGNOSIS — I272 Pulmonary hypertension, unspecified: Secondary | ICD-10-CM

## 2018-03-30 DIAGNOSIS — I10 Essential (primary) hypertension: Secondary | ICD-10-CM | POA: Diagnosis not present

## 2018-03-30 DIAGNOSIS — I517 Cardiomegaly: Secondary | ICD-10-CM | POA: Diagnosis not present

## 2018-03-30 NOTE — Patient Instructions (Signed)

## 2018-04-29 ENCOUNTER — Other Ambulatory Visit: Payer: Self-pay | Admitting: Cardiology

## 2018-05-12 ENCOUNTER — Ambulatory Visit: Payer: PPO | Admitting: Adult Health

## 2018-05-12 ENCOUNTER — Encounter: Payer: Self-pay | Admitting: Adult Health

## 2018-05-12 ENCOUNTER — Other Ambulatory Visit: Payer: Self-pay

## 2018-05-12 VITALS — BP 144/82 | HR 70 | Resp 18 | Ht 63.0 in | Wt 190.6 lb

## 2018-05-12 DIAGNOSIS — I10 Essential (primary) hypertension: Secondary | ICD-10-CM

## 2018-05-12 DIAGNOSIS — I480 Paroxysmal atrial fibrillation: Secondary | ICD-10-CM

## 2018-05-12 DIAGNOSIS — E785 Hyperlipidemia, unspecified: Secondary | ICD-10-CM | POA: Diagnosis not present

## 2018-05-12 DIAGNOSIS — I63411 Cerebral infarction due to embolism of right middle cerebral artery: Secondary | ICD-10-CM

## 2018-05-12 NOTE — Patient Instructions (Signed)
Continue Xarelto (rivaroxaban) daily  and lipitor for secondary stroke prevention  Continue to follow up with PCP regarding cholesterol and blood pressure management   Continue to follow with cardiologist as scheduled for atrial fibrillation  Continue to stay active and maintain a healthy diet  Continue to monitor blood pressure at home  Maintain strict control of hypertension with blood pressure goal below 130/90, diabetes with hemoglobin A1c goal below 6.5% and cholesterol with LDL cholesterol (bad cholesterol) goal below 70 mg/dL. I also advised the patient to eat a healthy diet with plenty of whole grains, cereals, fruits and vegetables, exercise regularly and maintain ideal body weight.  Followup in the future with me in 6 months or call earlier if needed       Thank you for coming to see [place] at [facility]. I hope we have been able to provide you high quality care today.  You may receive a patient satisfaction survey over the next few weeks. We would appreciate your feedback and comments so that we may continue to improve ourselves and the health of our patients.

## 2018-05-12 NOTE — Progress Notes (Signed)
[facility] [address] [place]. [place] [address] [phone]       OFFICE FOLLOW UP NOTE  [name] Date of Birth:  03-23-1951 Medical Record Number:  [id number]   Reason for Referral:  Stroke hospital follow up  CHIEF COMPLAINT:  Chief Complaint  Patient presents with  . Hx. of CVA 09/10/17    Rm. 9.  Denies new stroke sx. and reports compliance with Xarelto/fim    HPI: [name] is being seen today in the office for follow up of stroke on 09/10/2017. History obtained from patient, daughter and chart review. Reviewed all radiology images and labs personally.  [name] is a 67 y.o. female with PMH of A. Fib/A flutter on Coumadin, hypertension, and hyperlipidemia admitted with complaint of acute onset left-sided facial droop, dysarthria and weakness.  CT scan reviewed and showed a subcentimeter area of hypoattenuation in the right temporal lobe which may represent an age indeterminate lacunar infarct.  TPA was administered due to rapid improvement of symptoms.  MRI scan reviewed and showed an acute/early subacute infarction of right posterior insular cortex and small focus frontal operculum with an dditional subcentimeter cortical infarction in left frontal lobe.  MRA of head/neck showed a right M2 inferior division occlusion in the mid sylvian fissure.  No large vessel occlusion was shown.  2D echo did show an EF of 55-60%.  Carotid bilateral ultrasound showed bilateral stenosis of 1-39%.  The strokes had a suspected etiology of cardioembolic atrial fibrillation with subtherapeutic INR on Coumadin.  INR level on admission was 1.4.  It was recommended the patient discontinue Coumadin and start Xarelto.  LDL 88 and was started on Lipitor 40 mg as patient was not on previous medication.  A1c at 6.8 was started on NovoLog in the hospital but was not discharged with any diabetic medication.  Patient discharged home without any residual deficits and  in stable condition.  11/12/17 visit JV/PS: Since discharge, patient has been doing well.  Patient was accompanied today with her daughter.  Patient continues to take Xarelto without any side effects of increased bleeding or bruising.  Patient continues to take Lipitor without any side effects of myalgias.  Patient's blood pressure today is on higher side of satisfactory at 145/94.  Per patient, cardiology recently increased losartan 200 mg daily and has an appointment with him at the end of March.  Patient was advised to monitor blood pressure at home.  Patient continues to be active as she was with her daughter and grandson who is 54 years old.  Patient has been trying to be healthier and attempts to lower cholesterol and A1c levels.  Patient denies new or worsening stroke/TIA symptoms.  Interval History 05/12/18: Patient is being seen today for scheduled follow-up visit and is accompanied by her daughter.  She continues to do well without residual stroke symptoms or deficits.  She does have a complaint about recent headache that is located on bilateral sides of her temporal area with a throbbing sensation on pain scale 2-3/10 that has been occurring since last week.  Denies any history of headaches or migraines.  She believes this could be related to her sinuses as she does have some pressure behind her eyes, they do not occur daily and do not last long.  She does take Tylenol as needed and this provides relief.  Continues to take Xarelto without side effects of bleeding or bruising and is followed by cardiologist for atrial fibrillation Xarelto management.  Continues to take Lipitor without side effects myalgias.  Blood pressure today satisfactory 144/82 and she does continue to monitor this at home typical SBP 1 20-1 40.  She does continue to exercise and maintain a healthy diet.  Denies new or worsening stroke/TIA symptoms.    ROS:   14 system review of systems performed and negative with exception of  headache  PMH:  Past Medical History:  Diagnosis Date  . Atrial fibrillation ([place])   . Atrial flutter ([place])   . Hypertension   . Left ventricular hypertrophy    possibly hypertrophic cardiomyopathy  . Obesity   . Stroke [facility])     PSH:  Past Surgical History:  Procedure Laterality Date  . ABDOMINAL HYSTERECTOMY      Social History:  Social History   Socioeconomic History  . Marital status: Divorced    Spouse name: Not on file  . Number of children: Not on file  . Years of education: Not on file  . Highest education level: Not on file  Occupational History  . Not on file  Social Needs  . Financial resource strain: Not on file  . Food insecurity:    Worry: Not on file    Inability: Not on file  . Transportation needs:    Medical: Not on file    Non-medical: Not on file  Tobacco Use  . Smoking status: Former Smoker    Types: Cigarettes  . Smokeless tobacco: Never Used  Substance and Sexual Activity  . Alcohol use: No  . Drug use: No  . Sexual activity: Not on file  Lifestyle  . Physical activity:    Days per week: Not on file    Minutes per session: Not on file  . Stress: Not on file  Relationships  . Social connections:    Talks on phone: Not on file    Gets together: Not on file    Attends religious service: Not on file    Active member of club or organization: Not on file    Attends meetings of clubs or organizations: Not on file    Relationship status: Not on file  . Intimate partner violence:    Fear of current or ex partner: Not on file    Emotionally abused: Not on file    Physically abused: Not on file    Forced sexual activity: Not on file  Other Topics Concern  . Not on file  Social History Narrative  . Not on file    Family History:  Family History  Problem Relation Age of Onset  . Heart failure Mother   . Stroke Paternal Grandfather   . Diabetes Daughter     Medications:   Current Outpatient Medications on File Prior to Visit    Medication Sig Dispense Refill  . atorvastatin (LIPITOR) 40 MG tablet Take 1 tablet (40 mg total) by mouth daily at 6 PM. 90 tablet 3  . dofetilide (TIKOSYN) 250 MCG capsule Take 1 capsule (250 mcg total) by mouth 2 (two) times daily. 60 capsule 1  . hydrocortisone 1 % lotion Apply 1 application topically daily. For dry skin on face.     Marland Kitchen KLOR-CON M20 20 MEQ tablet TAKE 1 TABLET BY MOUTH EVERY DAY 90 tablet 2  . losartan (COZAAR) 100 MG tablet Take 1 tablet (100 mg total) by mouth daily. 90 tablet 3  . magnesium oxide (MAG-OX) 400 MG tablet TAKE 2 TABLETS DAILY BY MOUTH. 180 tablet 1  . metoprolol tartrate (  LOPRESSOR) 25 MG tablet Take 1 tablet (25 mg total) by mouth 2 (two) times daily. 180 tablet 3  . OVER THE COUNTER MEDICATION Apply 1 application topically daily as needed. "athletes foot cream"     . XARELTO 20 MG TABS tablet TAKE 1 TABLET (20 MG TOTAL) BY MOUTH DAILY WITH SUPPER. 90 tablet 1   No current facility-administered medications on file prior to visit.     Allergies:   Allergies  Allergen Reactions  . Latex Itching    Physical Exam  Vitals:   05/12/18 1057  BP: (!) 144/82  Pulse: 70  Resp: 18  Weight: 190 lb 9.6 oz (86.5 kg)  Height: 5\' 3"  (1.6 m)   Body mass index is 33.76 kg/m. No exam data present  General: well developed, middle-aged African-American female, well nourished, seated, in no evident distress Head: head normocephalic and atraumatic.   Neck: supple with no carotid or supraclavicular bruits Cardiovascular: regular rate and rhythm, no murmurs Musculoskeletal: no deformity Skin:  no rash/petichiae Vascular:  Normal pulses all extremities  Neurologic Exam Mental Status: Awake and fully alert. Oriented to place and time. Recent and remote memory intact. Attention span, concentration and fund of knowledge appropriate. Mood and affect appropriate.  Cranial Nerves: Fundoscopic exam reveals sharp disc margins. Pupils equal, briskly reactive to light.  Extraocular movements full without nystagmus. Visual fields full to confrontation. Hearing intact. Facial sensation intact. Face, tongue, palate moves normally and symmetrically.  Motor: Normal bulk and tone. Normal strength in all tested extremity muscles. Sensory.: intact to touch , pinprick , position and vibratory sensation.  Coordination: Rapid alternating movements normal in all extremities. Finger-to-nose and heel-to-shin performed accurately bilaterally. Gait and Station: Arises from chair without difficulty. Stance is normal. Gait demonstrates normal stride length and balance . Able to heel, toe and tandem walk without difficulty.  Reflexes: 1+ and symmetric. Toes downgoing.     Diagnostic Data (Labs, Imaging, Testing)  Ct Head Wo Contrast Result Date: 09/10/2017 IMPRESSION: Subcentimeter area of hypoattenuation in the right temporal lobe, adjacent to the sylvian fissure, with uncertain significance. This may represent an age-indeterminate lacunar infarct, or dilated perivascular space. No evidence of intracranial hemorrhage. Electronically Signed   By: Fidela Salisbury M.D.   On: 09/10/2017 18:31   Mr Virgel Paling and Neck Wo Contrast Result Date: 09/11/2017 IMPRESSION: 1. Acute/early subacute infarction of right posterior insula cortex and small focus in frontal operculum. Additional subcentimeter cortical infarction in left parietal lobe. 2. Right M2 inferior division occlusion in mid sylvian fissure. 3. Otherwise patent circle of Willis. No large vessel occlusion, aneurysm, or significant stenosis is identified. 4. Patent carotid and vertebral arteries. No dissection, aneurysm, or significant stenosis by NASCET criteria is identified. 5. Background of mild chronic microvascular ischemic changes and mild parenchymal volume loss of the brain. These results will be called to the ordering clinician or representative by the Radiologist Assistant, and communication documented in the PACS or  zVision Dashboard.   Echocardiogram:  Study Conclusions - Left ventricle: The cavity size was normal. There was mild concentric hypertrophy. Systolic function was normal. The estimated ejection fraction was in the range of 55% to 60%. Wall motion was normal; there were no regional wall motion abnormalities. There was a reduced contribution of atrial contraction to ventricular filling, due to increased ventricular diastolic pressure or atrial contractile dysfunction. Doppler parameters are consistent with a reversible restrictive pattern, indicative of decreased left ventricular diastolic compliance and/or increased left atrial pressure (grade 3 diastolic  dysfunction). Doppler parameters are consistent with high ventricular filling pressure. - Aortic valve: There was trivial regurgitation. - Mitral valve: There was mild regurgitation. - Left atrium: The atrium was severely dilated. - Right atrium: The atrium was severely dilated. - Pulmonary arteries: PA peak pressure: 78 mm Hg (S). Impressions: - The right ventricular systolic pressure was increased consistent with severe pulmonary hypertension  B/L Carotid U/S:                                                 Final Interpretation: Right Carotid: There is evidence in the right ICA of a 1-39% stenosis. Left Carotid: There is evidence in the left ICA of a 1-39% stenosis. Vertebrals: Both vertebral arteries were patent with antegrade flow.     ASSESSMENT: Talina Pleitez 67 y.o. year old female here with right temporal MCA branch embolic stroke on 55/73/2202 secondary to atrial fibrillation with subtherapeutic INR on Coumadin.  Vascular risk factors include atrial fibrillation, hypertension, and hyperlipidemia.  Patient is being seen today for follow-up and overall is doing well from a stroke standpoint.   PLAN: -Continue Xarelto (rivaroxaban) daily  and Lipitor for secondary stroke prevention -F/u with  PCP regarding your HLD and HTN management -request PCP to continue to prescribe -f/u with cardiologist regarding atrial fibrillation and Xarelto management -continue to monitor BP at home -Advised to continue to stay active and maintain a healthy diet -Maintain strict control of hypertension with blood pressure goal below 130/90, diabetes with hemoglobin A1c goal below 6.5% and cholesterol with LDL cholesterol (bad cholesterol) goal below 70 mg/dL. I also advised the patient to eat a healthy diet with plenty of whole grains, cereals, fruits and vegetables, exercise regularly and maintain ideal body weight.  Follow up in 6 months or call earlier if needed  Greater than 50% of time during this 25  minute visit was spent on counseling,explanation of diagnosis, planning of further management, discussion with patient and family and coordination of care.   Venancio Poisson, AGNP-BC  [facility] [address] [place],  [address]  Phone [phone] Fax [phone] Note: This document was prepared with digital dictation and possible smart phrase technology. Any transcriptional errors that result from this process are unintentional.

## 2018-05-15 NOTE — Progress Notes (Signed)
I agree with the above plan 

## 2018-06-11 ENCOUNTER — Telehealth: Payer: Self-pay | Admitting: Pharmacist Clinician (PhC)/ Clinical Pharmacy Specialist

## 2018-06-11 NOTE — Telephone Encounter (Signed)
Patient assistance samples arrived - 3 month supply

## 2018-08-07 ENCOUNTER — Other Ambulatory Visit: Payer: Self-pay | Admitting: Cardiology

## 2018-08-28 DIAGNOSIS — E669 Obesity, unspecified: Secondary | ICD-10-CM | POA: Diagnosis not present

## 2018-08-28 DIAGNOSIS — Z1159 Encounter for screening for other viral diseases: Secondary | ICD-10-CM | POA: Diagnosis not present

## 2018-08-28 DIAGNOSIS — Z8673 Personal history of transient ischemic attack (TIA), and cerebral infarction without residual deficits: Secondary | ICD-10-CM | POA: Diagnosis not present

## 2018-08-28 DIAGNOSIS — Z23 Encounter for immunization: Secondary | ICD-10-CM | POA: Diagnosis not present

## 2018-08-28 DIAGNOSIS — I10 Essential (primary) hypertension: Secondary | ICD-10-CM | POA: Diagnosis not present

## 2018-08-28 DIAGNOSIS — I4891 Unspecified atrial fibrillation: Secondary | ICD-10-CM | POA: Diagnosis not present

## 2018-09-03 ENCOUNTER — Other Ambulatory Visit: Payer: Self-pay | Admitting: Pharmacist

## 2018-09-03 MED ORDER — DOFETILIDE 250 MCG PO CAPS: 250.0000 ug | ORAL_CAPSULE | Freq: Two times a day (BID) | ORAL | 3 refills | Status: DC

## 2018-09-03 NOTE — Telephone Encounter (Signed)
HARD copy of Tikosyn 239mcg Rx faxed to [facility] pharmacy at [phone] per patient request.

## 2018-09-07 ENCOUNTER — Other Ambulatory Visit: Payer: Self-pay | Admitting: Cardiology

## 2018-09-23 ENCOUNTER — Other Ambulatory Visit: Payer: Self-pay | Admitting: Pharmacist

## 2018-09-23 MED ORDER — DOFETILIDE 250 MCG PO CAPS: 250.0000 ug | ORAL_CAPSULE | Freq: Two times a day (BID) | ORAL | 3 refills | Status: DC

## 2018-09-23 NOTE — Telephone Encounter (Signed)
Rx Outreach currently out off dofetilide 267mcg and unable to guarantee delivery in 5 days. Patient requesting local Rx.   I was able to find dofetilide 211mcg at [place]. Patient agreed with plan.  She will cal call as needed for further assistance.

## 2018-09-26 NOTE — Progress Notes (Signed)
HPI The patient presents for followup of atrial fibrillation and hypertrophic cardiomyopathy.  She is been feeling well.  She goes to the gym.  She works out.  Her breathing is been fine.  She denies any cardiovascular symptoms.  The patient denies any new symptoms such as chest discomfort, neck or arm discomfort. There has been no new shortness of breath, PND or orthopnea. There have been no reported palpitations, presyncope or syncope.  Of note she is in fibrillation today but she would not really notice this.  She has been compliant with her anticoagulation and her Tikosyn.   Allergies  Allergen Reactions  . Latex Itching    Current Outpatient Medications  Medication Sig Dispense Refill  . atorvastatin (LIPITOR) 40 MG tablet Take 1 tablet (40 mg total) by mouth daily at 6 PM. 90 tablet 3  . dofetilide (TIKOSYN) 250 MCG capsule Take 1 capsule (250 mcg total) by mouth 2 (two) times daily. 180 capsule 3  . hydrocortisone 1 % lotion Apply 1 application topically daily. For dry skin on face.     Marland Kitchen KLOR-CON M20 20 MEQ tablet TAKE 1 TABLET BY MOUTH EVERY DAY 90 tablet 2  . losartan (COZAAR) 100 MG tablet Take 1 tablet (100 mg total) by mouth daily. 90 tablet 3  . magnesium oxide (MAG-OX) 400 MG tablet TAKE 2 TABLETS DAILY BY MOUTH. 180 tablet 1  . metoprolol tartrate (LOPRESSOR) 25 MG tablet Take 1 tablet (25 mg total) by mouth 2 (two) times daily. 180 tablet 3  . OVER THE COUNTER MEDICATION Apply 1 application topically daily as needed. "athletes foot cream"     . XARELTO 20 MG TABS tablet TAKE 1 TABLET (20 MG TOTAL) BY MOUTH DAILY WITH SUPPER. 90 tablet 1   No current facility-administered medications for this visit.     Past Medical History:  Diagnosis Date  . Atrial fibrillation ([place])   . Atrial flutter ([place])   . Hypertension   . Left ventricular hypertrophy    possibly hypertrophic cardiomyopathy  . Obesity   . Stroke [facility])     Past Surgical History:  Procedure  Laterality Date  . ABDOMINAL HYSTERECTOMY      ROS: Positive for sinus trouble.  Otherwise as stated in the HPI and negative for all other systems.   PHYSICAL EXAM BP 132/78   Pulse 73   Ht 5\' 3"  (1.6 m)   Wt 197 lb (89.4 kg)   BMI 34.90 kg/m   GENERAL:  Well appearing NECK:  No jugular venous distention, waveform within normal limits, carotid upstroke brisk and symmetric, no bruits, no thyromegaly LUNGS:  Clear to auscultation bilaterally CHEST:  Unremarkable HEART:  PMI not displaced or sustained,S1 and S2 within normal limits, no S3,  no clicks, no rubs, no murmurs, irregular ABD:  Flat, positive bowel sounds normal in frequency in pitch, no bruits, no rebound, no guarding, no midline pulsatile mass, no hepatomegaly, no splenomegaly EXT:  2 plus pulses throughout, no edema, no cyanosis no clubbing   EKG: Atrial fibrillation, rate 73, axis within intervals with, hypertrophy with position change from previous.  ASSESSMENT AND PLAN  Atrial fibrillation -   She is tolerating the Tikosyn.    Ms. Skyann Ganim has a CHA2DS2 - VASc score of 2 with a risk of stroke of 2%.  However, she is back in atrial fibrillation.  I do not know if this is persistent or paroxysmal so we will put a 48-hour Holter.  If she is in persistent fibrillation she will need to be scheduled for cardioversion.  TIA - She tolerates anticoagulation and has had no further events.   HYPERTENSION, BENIGN -  Her blood pressure is at target.  No change in therapy.   HYPERTROPHIC CARDIOMYOPATHY  This may have been related to difficult to control hypertension which is now much better controlled.  No change in therapy.  I will assess again with echo as below.   PULMONARY HTN This was reduced on the last echo and I will follow-up with an echo probably in the fall.  OVERWEIGHT She has contained her weight.  I encouraged more weight loss.

## 2018-09-28 ENCOUNTER — Encounter (INDEPENDENT_AMBULATORY_CARE_PROVIDER_SITE_OTHER): Payer: Self-pay

## 2018-09-28 ENCOUNTER — Encounter: Payer: Self-pay | Admitting: Cardiology

## 2018-09-28 ENCOUNTER — Ambulatory Visit (INDEPENDENT_AMBULATORY_CARE_PROVIDER_SITE_OTHER): Payer: Medicare HMO | Admitting: Cardiology

## 2018-09-28 VITALS — BP 132/78 | HR 73 | Ht 63.0 in | Wt 197.0 lb

## 2018-09-28 DIAGNOSIS — I4891 Unspecified atrial fibrillation: Secondary | ICD-10-CM

## 2018-09-28 DIAGNOSIS — I272 Pulmonary hypertension, unspecified: Secondary | ICD-10-CM | POA: Diagnosis not present

## 2018-09-28 DIAGNOSIS — I517 Cardiomegaly: Secondary | ICD-10-CM | POA: Diagnosis not present

## 2018-09-28 NOTE — Patient Instructions (Signed)
Medication Instructions:  Continue current medications  If you need a refill on your cardiac medications before your next appointment, please call your pharmacy.  Labwork: None ordered   Take the provided lab slips with you to the lab for your blood draw.  When you have your labs (blood work) drawn today and your tests are completely normal, you will receive your results only by MyChart Message (if you have MyChart) -OR-  A paper copy in the mail.  If you have any lab test that is abnormal or we need to change your treatment, we will call you to review these results.  Testing/Procedures: Your physician has recommended that you wear a 48 hour holter monitor. Holter monitors are medical devices that record the heart's electrical activity. Doctors most often use these monitors to diagnose arrhythmias. Arrhythmias are problems with the speed or rhythm of the heartbeat. The monitor is a small, portable device. You can wear one while you do your normal daily activities. This is usually used to diagnose what is causing palpitations/syncope (passing out).   Follow-Up: You will need a follow up appointment in 3 months.  Please call our office 2 months in advance to schedule this appointment.  You may see Dr Percival Spanish or one of the following Advanced Practice Providers on your designated Care Team:   Rosaria Ferries, PA-C . Jory Sims, DNP, ANP  At [facility], you and your health needs are our priority.  As part of our continuing mission to provide you with exceptional heart care, we have created designated Provider Care Teams.  These Care Teams include your primary Cardiologist (physician) and Advanced Practice Providers (APPs -  Physician Assistants and Nurse Practitioners) who all work together to provide you with the care you need, when you need it.  Thank you for choosing [facility] at [facility]!!

## 2018-10-06 ENCOUNTER — Ambulatory Visit (INDEPENDENT_AMBULATORY_CARE_PROVIDER_SITE_OTHER): Payer: Medicare HMO

## 2018-10-06 DIAGNOSIS — I4891 Unspecified atrial fibrillation: Secondary | ICD-10-CM | POA: Diagnosis not present

## 2018-10-12 ENCOUNTER — Other Ambulatory Visit: Payer: Self-pay | Admitting: Cardiology

## 2018-10-12 ENCOUNTER — Telehealth: Payer: Self-pay | Admitting: *Deleted

## 2018-10-12 DIAGNOSIS — I4891 Unspecified atrial fibrillation: Secondary | ICD-10-CM

## 2018-10-12 NOTE — Telephone Encounter (Signed)
-----   Message from Minus Breeding, MD sent at 10/11/2018 11:12 AM EST ----- She has paroxysmal fib/flutter with tachy brady rates.  She has this despite Tikosyn therapy.  I would like for her to see Dr. Rayann Heman or Dr. Curt Bears to discuss possible ablation.  Call Ms. Casino with the results and send results to [place], L.Marlou Sa, MD

## 2018-10-12 NOTE — Telephone Encounter (Signed)
Pt aware of her Monitor referral send to Dr Rayann Heman and/or Dr Curt Bears

## 2018-10-21 ENCOUNTER — Ambulatory Visit: Payer: Medicare HMO | Admitting: Cardiology

## 2018-10-21 ENCOUNTER — Encounter: Payer: Self-pay | Admitting: Cardiology

## 2018-10-21 VITALS — BP 130/80 | HR 109 | Ht 63.0 in | Wt 195.0 lb

## 2018-10-21 DIAGNOSIS — I4891 Unspecified atrial fibrillation: Secondary | ICD-10-CM | POA: Diagnosis not present

## 2018-10-21 DIAGNOSIS — I483 Typical atrial flutter: Secondary | ICD-10-CM | POA: Diagnosis not present

## 2018-10-21 DIAGNOSIS — Z01812 Encounter for preprocedural laboratory examination: Secondary | ICD-10-CM

## 2018-10-21 DIAGNOSIS — I480 Paroxysmal atrial fibrillation: Secondary | ICD-10-CM

## 2018-10-21 NOTE — Progress Notes (Signed)
Electrophysiology Office Note   Date:  10/21/2018   ID:  [name], [place] 04-Jul-1951, MRN [id number]  PCP:  Alroy Dust, Carlean Jews.Marlou Sa, MD  Cardiologist:  Percival Spanish Primary Electrophysiologist:  Merary Garguilo Meredith Leeds, MD    No chief complaint on file.    History of Present Illness: [name] is a 68 y.o. female who is being seen today for the evaluation of atrial fibrillation/flutter at the request of Bethany Breeding, MD. Presenting today for electrophysiology evaluation.  She has a history significant for atrial fibrillation and hypertrophic cardiomyopathy.  She is currently on dofetilide.    Today, she denies symptoms of chest pain,orthopnea, PND, lower extremity edema, claudication, dizziness, presyncope, syncope, bleeding, or neurologic sequela. The patient is tolerating medications without difficulties.  Complaints today are palpitations shortness of breath, weakness, and fatigue.  She feels these episodes on most days, but they are certainly intermittent.  She is aware that her heart rate is fast at times.   Past Medical History:  Diagnosis Date  . Atrial fibrillation ([place])   . Atrial flutter ([place])   . Hypertension   . Left ventricular hypertrophy    possibly hypertrophic cardiomyopathy  . Obesity   . Stroke [facility])    Past Surgical History:  Procedure Laterality Date  . ABDOMINAL HYSTERECTOMY       Current Outpatient Medications  Medication Sig Dispense Refill  . atorvastatin (LIPITOR) 40 MG tablet Take 1 tablet (40 mg total) by mouth daily at 6 PM. 90 tablet 3  . dofetilide (TIKOSYN) 250 MCG capsule Take 1 capsule (250 mcg total) by mouth 2 (two) times daily. 180 capsule 3  . hydrocortisone 1 % lotion Apply 1 application topically daily. For dry skin on face.     Marland Kitchen KLOR-CON M20 20 MEQ tablet TAKE 1 TABLET BY MOUTH EVERY DAY 90 tablet 2  . losartan (COZAAR) 100 MG tablet TAKE 1 TABLET BY MOUTH EVERY DAY 90 tablet 3  . magnesium oxide (MAG-OX) 400 MG tablet TAKE 2  TABLETS DAILY BY MOUTH. 180 tablet 1  . metoprolol tartrate (LOPRESSOR) 25 MG tablet Take 1 tablet (25 mg total) by mouth 2 (two) times daily. 180 tablet 3  . OVER THE COUNTER MEDICATION Apply 1 application topically daily as needed. "athletes foot cream"     . XARELTO 20 MG TABS tablet TAKE 1 TABLET (20 MG TOTAL) BY MOUTH DAILY WITH SUPPER. 90 tablet 1   No current facility-administered medications for this visit.     Allergies:   Latex   Social History:  The patient  reports that she has quit smoking. Her smoking use included cigarettes. She has never used smokeless tobacco. She reports that she does not drink alcohol or use drugs.   Family History:  The patient's family history includes Diabetes in her daughter; Heart failure in her mother; Stroke in her paternal grandfather.    ROS:  Please see the history of present illness.   Otherwise, review of systems is positive for shortness of breath, palpitations, leg pain.   All other systems are reviewed and negative.    PHYSICAL EXAM: VS:  BP 130/80   Pulse (!) 109   Ht 5\' 3"  (1.6 m)   Wt 195 lb (88.5 kg)   BMI 34.54 kg/m  , BMI Body mass index is 34.54 kg/m. GEN: Well nourished, well developed, in no acute distress  HEENT: normal  Neck: no JVD, carotid bruits, or masses Cardiac: iRRR; no murmurs, rubs, or gallops,no edema  Respiratory:  clear  to auscultation bilaterally, normal work of breathing GI: soft, nontender, nondistended, + BS MS: no deformity or atrophy  Skin: warm and dry Neuro:  Strength and sensation are intact Psych: euthymic mood, full affect  EKG:  EKG is ordered today. Personal review of the ekg ordered shows atrial flutter, rate 105   Recent Labs: 12/10/2017: BUN 15; Creatinine, Ser 1.06; Magnesium 2.1; Potassium 4.6; Sodium 141    Lipid Panel     Component Value Date/Time   CHOL 136 09/11/2017 0345   TRIG 53 09/11/2017 0345   HDL 37 (L) 09/11/2017 0345   CHOLHDL 3.7 09/11/2017 0345   VLDL 11  09/11/2017 0345   LDLCALC 88 09/11/2017 0345     Wt Readings from Last 3 Encounters:  10/21/18 195 lb (88.5 kg)  09/28/18 197 lb (89.4 kg)  05/12/18 190 lb 9.6 oz (86.5 kg)      Other studies Reviewed: Additional studies/ records that were reviewed today include: TTE 03/03/18  Review of the above records today demonstrates:  - Left ventricle: The cavity size was normal. Wall thickness was   increased in a pattern of moderate LVH. Systolic function was   normal. The estimated ejection fraction was in the range of 60%   to 65%. Wall motion was normal; there were no regional wall   motion abnormalities. Doppler parameters are consistent with a   reversible restrictive pattern, indicative of decreased left   ventricular diastolic compliance and/or increased left atrial   pressure (grade 3 diastolic dysfunction). - Aortic valve: There was trivial regurgitation. - Left atrium: The atrium was severely dilated. - Right atrium: The atrium was moderately dilated. - Pulmonary arteries: Systolic pressure was moderately increased.   PA peak pressure: 45 mm Hg (S).  Holter 10/06/18 - personally reviewed NSR PAF and atrial flutter Occasional atrial ectopy   ASSESSMENT AND PLAN:  1.  Paroxysmal atrial fibrillation/atrial flutter: Currently on dofetilide and Xarelto.  Discussed with her repeat cardioversion versus ablation.  Risks and benefits of ablation were discussed and include bleeding, tamponade, heart block, stroke, damage surrounding organs.  She understands these risks and is agreed to the procedure.  This patients CHA2DS2-VASc Score and unadjusted Ischemic Stroke Rate (% per year) is equal to 3.2 % stroke rate/year from a score of 3  Above score calculated as 1 point each if present [CHF, HTN, DM, Vascular=MI/PAD/Aortic Plaque, Age if 65-74, or Female] Above score calculated as 2 points each if present [Age > 75, or Stroke/TIA/TE]  2.  Hypertension: Currently well controlled  3.   Hypertrophic cardiomyopathy: Transthoracic echo shows moderate LVH with a restrictive pattern of grade 3 diastolic dysfunction.  No obvious signs of volume overload  Current medicines are reviewed at length with the patient today.   The patient does not have concerns regarding her medicines.  The following changes were made today:  none  Labs/ tests ordered today include:  Orders Placed This Encounter  Procedures  . CT CARDIAC MORPH/PULM VEIN W/CM&W/O CA SCORE  . CT CORONARY FRACTIONAL FLOW RESERVE DATA PREP  . CT CORONARY FRACTIONAL FLOW RESERVE FLUID ANALYSIS  . Basic metabolic panel  . CBC  . EKG 12-Lead   Case discussed with primary cardiology  Disposition:   FU with Kerrie Latour 3 months  Signed, Tashea Othman Meredith Leeds, MD  10/21/2018 9:50 AM     [facility] [address] [place] [address] [phone] (office) [phone] (fax)

## 2018-10-21 NOTE — Addendum Note (Signed)
Addended by: Eulis Foster on: 10/21/2018 10:18 AM   Modules accepted: Orders

## 2018-10-21 NOTE — Patient Instructions (Signed)
Medication Instructions:  Your physician recommends that you continue on your current medications as directed. Please refer to the Current Medication list given to you today.  Labwork: Pre procedure lab work today: BMET & CBC *Will notify you of abnormal results, otherwise continue current treatment plan.  Testing/Procedures: Your physician has recommended that you have an ablation. Catheter ablation is a medical procedure used to treat some cardiac arrhythmias (irregular heartbeats). During catheter ablation, a long, thin, flexible tube is put into a blood vessel in your groin (upper thigh), or neck. This tube is called an ablation catheter. It is then guided to your heart through the blood vessel. Radio frequency waves destroy small areas of heart tissue where abnormal heartbeats may cause an arrhythmia to start.  Instructions for your ablation: 1. Please arrive at the [facility], Main Entrance "A", of [facility] at 5:30 a.m. on 11/12/2018. 2. Do not eat or drink after midnight the night prior to the procedure. 3. Do not miss any doses of XARELTO prior to the morning of the procedure.  4. Do not take any medications the morning of the procedure. 5. Plan for an overnight stay in the hospital. 6. You will need someone to drive you home at discharge.   Follow-Up: Your physician recommends that you schedule a follow-up appointment in: 4 weeks, after your procedure on 11/12/2018, with the AFib clinic.  Your physician recommends that you schedule a follow-up appointment in: 3 months, after your procedure on 11/12/2018, with Dr. Curt Bears.  * If you need a refill on your cardiac medications before your next appointment, please call your pharmacy.   *Please note that any paperwork needing to be filled out by the provider will need to be addressed at the front desk prior to seeing the provider. Please note that any FMLA, disability or other documents regarding health condition is subject to a  $25.00 charge that must be received prior to completion of paperwork in the form of a money order or check.  Thank you for choosing [facility]!!   Trinidad Curet, RN [phone]  Any Other Special Instructions Will Be Listed Below (If Applicable).  CT INSTRUCTIONS Please arrive at the [facility] main entrance of [facility] at ________ AM (30-45 minutes prior to test start time)  [facility] [place],  [address] [phone]  Proceed to the [facility] Radiology Department (First Floor).  Please follow these instructions carefully (unless otherwise directed):  On the Night Before the Test: . Be sure to Drink plenty of water. . Do not consume any caffeinated/decaffeinated beverages or chocolate 12 hours prior to your test. . Do not take any antihistamines 12 hours prior to your test.  On the Day of the Test: . Drink plenty of water. Do not drink any water within one hour of the test. . Do not eat any food 4 hours prior to the test. . You may take your regular medications prior to the test.  . Take metoprolol (Lopressor) two hours prior to test. YOU WILL TAKE 100 MG OF THIS MEDICATION FOR THIS TEST      After the Test: . Drink plenty of water. . After receiving IV contrast, you may experience a mild flushed feeling. This is normal. . On occasion, you may experience a mild rash up to 24 hours after the test. This is not dangerous. If this occurs, you can take Benadryl 25 mg and increase your fluid intake. . If you  experience trouble breathing, this can be serious. If it is severe call 911 IMMEDIATELY. If it is mild, please call our office.    Cardiac Ablation Cardiac ablation is a procedure to disable (ablate) a small amount of heart tissue in very specific places. The heart has many electrical connections. Sometimes these connections are abnormal and can cause the heart to beat very fast or irregularly. Ablating some of the  problem areas can improve the heart rhythm or return it to normal. Ablation may be done for people who:  Have Wolff-Parkinson-White syndrome.  Have fast heart rhythms (tachycardia).  Have taken medicines for an abnormal heart rhythm (arrhythmia) that were not effective or caused side effects.  Have a high-risk heartbeat that may be life-threatening.  During the procedure, a small incision is made in the neck or the groin, and a long, thin, flexible tube (catheter) is inserted into the incision and moved to the heart. Small devices (electrodes) on the tip of the catheter will send out electrical currents. A type of X-ray (fluoroscopy) will be used to help guide the catheter and to provide images of the heart. Tell a health care provider about:  Any allergies you have.  All medicines you are taking, including vitamins, herbs, eye drops, creams, and over-the-counter medicines.  Any problems you or family members have had with anesthetic medicines.  Any blood disorders you have.  Any surgeries you have had.  Any medical conditions you have, such as kidney failure.  Whether you are pregnant or may be pregnant. What are the risks? Generally, this is a safe procedure. However, problems may occur, including:  Infection.  Bruising and bleeding at the catheter insertion site.  Bleeding into the chest, especially into the sac that surrounds the heart. This is a serious complication.  Stroke or blood clots.  Damage to other structures or organs.  Allergic reaction to medicines or dyes.  Need for a permanent pacemaker if the normal electrical system is damaged. A pacemaker is a small computer that sends electrical signals to the heart and helps your heart beat normally.  The procedure not being fully effective. This may not be recognized until months later. Repeat ablation procedures are sometimes required.  What happens before the procedure?  Follow instructions from your health  care provider about eating or drinking restrictions.  Ask your health care provider about: ? Changing or stopping your regular medicines. This is especially important if you are taking diabetes medicines or blood thinners. ? Taking medicines such as aspirin and ibuprofen. These medicines can thin your blood. Do not take these medicines before your procedure if your health care provider instructs you not to.  Plan to have someone take you home from the hospital or clinic.  If you will be going home right after the procedure, plan to have someone with you for 24 hours. What happens during the procedure?  To lower your risk of infection: ? Your health care team will wash or sanitize their hands. ? Your skin will be washed with soap. ? Hair may be removed from the incision area.  An IV tube will be inserted into one of your veins.  You will be given a medicine to help you relax (sedative).  The skin on your neck or groin will be numbed.  An incision will be made in your neck or your groin.  A needle will be inserted through the incision and into a large vein in your neck or groin.  A catheter will  be inserted into the needle and moved to your heart.  Dye may be injected through the catheter to help your surgeon see the area of the heart that needs treatment.  Electrical currents will be sent from the catheter to ablate heart tissue in desired areas. There are three types of energy that may be used to ablate heart tissue: ? Heat (radiofrequency energy). ? Laser energy. ? Extreme cold (cryoablation).  When the necessary tissue has been ablated, the catheter will be removed.  Pressure will be held on the catheter insertion area to prevent excessive bleeding.  A bandage (dressing) will be placed over the catheter insertion area. The procedure may vary among health care providers and hospitals. What happens after the procedure?  Your blood pressure, heart rate, breathing rate, and  blood oxygen level will be monitored until the medicines you were given have worn off.  Your catheter insertion area will be monitored for bleeding. You will need to lie still for a few hours to ensure that you do not bleed from the catheter insertion area.  Do not drive for 5-7 days or as long as directed by your health care provider. Summary  Cardiac ablation is a procedure to disable (ablate) a small amount of heart tissue in very specific places. Ablating some of the problem areas can improve the heart rhythm or return it to normal.  During the procedure, electrical currents will be sent from the catheter to ablate heart tissue in desired areas. This information is not intended to replace advice given to you by your health care provider. Make sure you discuss any questions you have with your health care provider. Document Released: 01/19/2009 Document Revised: 07/22/2016 Document Reviewed: 07/22/2016 Elsevier Interactive Patient Education  [employer].

## 2018-10-22 LAB — CBC
Hematocrit: 40.2 % (ref 34.0–46.6)
Hemoglobin: 12.6 g/dL (ref 11.1–15.9)
MCH: 24.9 pg — AB (ref 26.6–33.0)
MCHC: 31.3 g/dL — ABNORMAL LOW (ref 31.5–35.7)
MCV: 79 fL (ref 79–97)
Platelets: 238 10*3/uL (ref 150–450)
RBC: 5.07 x10E6/uL (ref 3.77–5.28)
RDW: 13.4 % (ref 11.7–15.4)
WBC: 4.6 10*3/uL (ref 3.4–10.8)

## 2018-10-22 LAB — BASIC METABOLIC PANEL
BUN/Creatinine Ratio: 21 (ref 12–28)
BUN: 20 mg/dL (ref 8–27)
CO2: 24 mmol/L (ref 20–29)
Calcium: 10 mg/dL (ref 8.7–10.3)
Chloride: 103 mmol/L (ref 96–106)
Creatinine, Ser: 0.97 mg/dL (ref 0.57–1.00)
GFR calc Af Amer: 70 mL/min/{1.73_m2} (ref >59)
GFR calc non Af Amer: 61 mL/min/{1.73_m2} (ref >59)
Glucose: 103 mg/dL — ABNORMAL HIGH (ref 65–99)
Potassium: 4.4 mmol/L (ref 3.5–5.2)
Sodium: 140 mmol/L (ref 134–144)

## 2018-10-29 ENCOUNTER — Telehealth: Payer: Self-pay | Admitting: Pharmacist

## 2018-10-29 NOTE — Telephone Encounter (Signed)
Received Tikosyn 231mcg from Patient assistance.  Patient notified to pick up ASAP

## 2018-11-03 ENCOUNTER — Telehealth (HOSPITAL_COMMUNITY): Payer: Self-pay | Admitting: Emergency Medicine

## 2018-11-03 ENCOUNTER — Other Ambulatory Visit: Payer: Self-pay | Admitting: *Deleted

## 2018-11-03 MED ORDER — METOPROLOL TARTRATE 25 MG PO TABS: 25.0000 mg | ORAL_TABLET | Freq: Two times a day (BID) | ORAL | 3 refills | Status: DC

## 2018-11-03 NOTE — Telephone Encounter (Signed)
Reaching out to patient to offer assistance regarding upcoming cardiac imaging study; pt verbalizes understanding of appt date/time, parking situation and where to check in, pre-test NPO status and medications ordered, and verified current allergies; name and call back number provided for further questions should they arise Marit Goodwill RN Navigator Cardiac Imaging [facility] [phone] office [phone] cell 

## 2018-11-06 ENCOUNTER — Ambulatory Visit (HOSPITAL_COMMUNITY)
Admission: RE | Admit: 2018-11-06 | Discharge: 2018-11-06 | Disposition: A | Discharge status: Home / Self Care | Payer: Medicare HMO | Source: Ambulatory Visit | Attending: Cardiology | Admitting: Cardiology

## 2018-11-06 ENCOUNTER — Ambulatory Visit (HOSPITAL_COMMUNITY): Admission: RE | Admit: 2018-11-06 | Payer: Medicare HMO | Source: Ambulatory Visit

## 2018-11-06 DIAGNOSIS — I4891 Unspecified atrial fibrillation: Secondary | ICD-10-CM | POA: Insufficient documentation

## 2018-11-06 DIAGNOSIS — I480 Paroxysmal atrial fibrillation: Secondary | ICD-10-CM | POA: Diagnosis present

## 2018-11-06 MED ORDER — IOPAMIDOL (ISOVUE-370) INJECTION 76%
100.0000 mL | Freq: Once | INTRAVENOUS | Status: AC | PRN
  Administered 2018-11-06: 80 mL via INTRAVENOUS

## 2018-11-10 ENCOUNTER — Telehealth: Payer: Self-pay | Admitting: Cardiology

## 2018-11-10 NOTE — Telephone Encounter (Signed)
Pt informed this morning that it may be late this evening before I could call her. Reviewed TEE instructions with patient.  She is aware to arrive at 8:30 tomorrow moring. Patient verbalized understanding and agreeable to plan.

## 2018-11-10 NOTE — Telephone Encounter (Signed)
Pt called and will bring her meds to the hosp with her.

## 2018-11-10 NOTE — Telephone Encounter (Signed)
°  Patient has questions about instructions for TEE

## 2018-11-11 ENCOUNTER — Encounter (HOSPITAL_COMMUNITY)
Admission: RE | Disposition: A | Discharge status: Home / Self Care | Payer: Self-pay | Source: Home / Self Care | Attending: Internal Medicine

## 2018-11-11 ENCOUNTER — Other Ambulatory Visit: Payer: Self-pay

## 2018-11-11 ENCOUNTER — Ambulatory Visit (HOSPITAL_BASED_OUTPATIENT_CLINIC_OR_DEPARTMENT_OTHER): Payer: Medicare HMO

## 2018-11-11 ENCOUNTER — Ambulatory Visit (HOSPITAL_COMMUNITY)
Admission: RE | Admit: 2018-11-11 | Discharge: 2018-11-11 | Disposition: A | Discharge status: Home / Self Care | Payer: Medicare HMO | Attending: Internal Medicine | Admitting: Internal Medicine

## 2018-11-11 ENCOUNTER — Encounter (HOSPITAL_COMMUNITY): Payer: Self-pay | Admitting: Internal Medicine

## 2018-11-11 DIAGNOSIS — Z823 Family history of stroke: Secondary | ICD-10-CM | POA: Insufficient documentation

## 2018-11-11 DIAGNOSIS — Z8249 Family history of ischemic heart disease and other diseases of the circulatory system: Secondary | ICD-10-CM | POA: Insufficient documentation

## 2018-11-11 DIAGNOSIS — Z90710 Acquired absence of both cervix and uterus: Secondary | ICD-10-CM | POA: Insufficient documentation

## 2018-11-11 DIAGNOSIS — I10 Essential (primary) hypertension: Secondary | ICD-10-CM | POA: Insufficient documentation

## 2018-11-11 DIAGNOSIS — Z8673 Personal history of transient ischemic attack (TIA), and cerebral infarction without residual deficits: Secondary | ICD-10-CM | POA: Diagnosis not present

## 2018-11-11 DIAGNOSIS — Z6834 Body mass index (BMI) 34.0-34.9, adult: Secondary | ICD-10-CM | POA: Insufficient documentation

## 2018-11-11 DIAGNOSIS — E669 Obesity, unspecified: Secondary | ICD-10-CM | POA: Insufficient documentation

## 2018-11-11 DIAGNOSIS — Z7901 Long term (current) use of anticoagulants: Secondary | ICD-10-CM | POA: Diagnosis not present

## 2018-11-11 DIAGNOSIS — I517 Cardiomegaly: Secondary | ICD-10-CM | POA: Diagnosis not present

## 2018-11-11 DIAGNOSIS — Z87891 Personal history of nicotine dependence: Secondary | ICD-10-CM | POA: Insufficient documentation

## 2018-11-11 DIAGNOSIS — I358 Other nonrheumatic aortic valve disorders: Secondary | ICD-10-CM | POA: Diagnosis not present

## 2018-11-11 DIAGNOSIS — I4892 Unspecified atrial flutter: Secondary | ICD-10-CM | POA: Insufficient documentation

## 2018-11-11 DIAGNOSIS — I4819 Other persistent atrial fibrillation: Secondary | ICD-10-CM

## 2018-11-11 DIAGNOSIS — I420 Dilated cardiomyopathy: Secondary | ICD-10-CM | POA: Insufficient documentation

## 2018-11-11 DIAGNOSIS — Z79899 Other long term (current) drug therapy: Secondary | ICD-10-CM | POA: Diagnosis not present

## 2018-11-11 DIAGNOSIS — Z91040 Latex allergy status: Secondary | ICD-10-CM | POA: Insufficient documentation

## 2018-11-11 HISTORY — PX: TEE WITHOUT CARDIOVERSION: SHX5443

## 2018-11-11 SURGERY — ECHOCARDIOGRAM, TRANSESOPHAGEAL
Anesthesia: Moderate Sedation

## 2018-11-11 MED ORDER — MIDAZOLAM HCL (PF) 5 MG/ML IJ SOLN
INTRAMUSCULAR | Status: AC
  Filled 2018-11-11: qty 2

## 2018-11-11 MED ORDER — SODIUM CHLORIDE 0.9 % IV SOLN
INTRAVENOUS | Status: DC
  Administered 2018-11-11: 08:00:00 via INTRAVENOUS

## 2018-11-11 MED ORDER — BUTAMBEN-TETRACAINE-BENZOCAINE 2-2-14 % EX AERO
INHALATION_SPRAY | CUTANEOUS | Status: DC | PRN
  Administered 2018-11-11: 2 via TOPICAL

## 2018-11-11 MED ORDER — FENTANYL CITRATE (PF) 100 MCG/2ML IJ SOLN
INTRAMUSCULAR | Status: DC | PRN
  Administered 2018-11-11 (×3): 25 ug via INTRAVENOUS

## 2018-11-11 MED ORDER — LIDOCAINE VISCOUS HCL 2 % MT SOLN
OROMUCOSAL | Status: AC
  Filled 2018-11-11: qty 15

## 2018-11-11 MED ORDER — LIDOCAINE VISCOUS HCL 2 % MT SOLN
OROMUCOSAL | Status: DC | PRN
  Administered 2018-11-11: 20 mL via OROMUCOSAL

## 2018-11-11 MED ORDER — FENTANYL CITRATE (PF) 100 MCG/2ML IJ SOLN
INTRAMUSCULAR | Status: AC
  Filled 2018-11-11: qty 2

## 2018-11-11 MED ORDER — SODIUM CHLORIDE 0.9 % IV SOLN: INTRAVENOUS | Status: DC

## 2018-11-11 MED ORDER — MIDAZOLAM HCL (PF) 10 MG/2ML IJ SOLN
INTRAMUSCULAR | Status: DC | PRN
  Administered 2018-11-11 (×2): 1 mg via INTRAVENOUS
  Administered 2018-11-11 (×2): 2 mg via INTRAVENOUS

## 2018-11-11 NOTE — Discharge Instructions (Signed)

## 2018-11-11 NOTE — CV Procedure (Signed)
TRANSESOPHAGEAL ECHOCARDIOGRAM (TEE) NOTE  INDICATIONS: atrial fibrillation, pre-ablation  PROCEDURE:   Informed consent was obtained prior to the procedure. The risks, benefits and alternatives for the procedure were discussed and the patient comprehended these risks.  Risks include, but are not limited to, cough, sore throat, vomiting, nausea, somnolence, esophageal and stomach trauma or perforation, bleeding, low blood pressure, aspiration, pneumonia, infection, trauma to the teeth and death.    After a procedural time-out, the patient was given 6 mg versed and 75 mcg fentanyl for moderate sedation.  The patient's heart rate, blood pressure, and oxygen saturation are monitored continuously during the procedure.The oropharynx was anesthetized 10 cc of topical 1% viscous lidocaine and 2 cetacaine sprays.  The transesophageal probe was inserted in the esophagus and stomach without difficulty and multiple views were obtained.  The patient was kept under observation until the patient left the procedure room.  The period of conscious sedation is 18 minutes, of which I was present face-to-face 100% of this time. The patient left the procedure room in stable condition.   Agitated microbubble saline contrast was not administered.  COMPLICATIONS:    There were no immediate complications.  Findings:  1. LEFT VENTRICLE: The left ventricular wall thickness is moderately increased.  The left ventricular cavity is normal in size. Wall motion is normal.  LVEF is 60-65%.  2. RIGHT VENTRICLE:  The right ventricle is normal in structure and function without any thrombus or masses.    3. LEFT ATRIUM:  The left atrium is severely dilated in size without any thrombus or masses.  There is spontaneous echo contrast ("smoke") in the left atrium consistent with a low flow state.  4. LEFT ATRIAL APPENDAGE:  The left atrial appendage is free of any thrombus or masses. The appendage has single lobes. Pulse  doppler indicates low flow in the appendage.  5. ATRIAL SEPTUM:  The atrial septum appears intact and is free of thrombus and/or masses.  There is no evidence for interatrial shunting by color doppler.  6. RIGHT ATRIUM:  The right atrium is moderately dilated with a prominent eustachian valve. There are no thrombi or masses.  7. MITRAL VALVE:  The mitral valve is normal in structure and function with Mild regurgitation.  There were no vegetations or stenosis.  8. AORTIC VALVE:  The aortic valve is trileaflet, mildly sclerotic with trivial regurgitation.  There were no vegetations or stenosis  9. TRICUSPID VALVE:  The tricuspid valve is normal in structure and function with Mild regurgitation.  There were no vegetations or stenosis. RVSP is 30 mmHg +RAP.  10.  PULMONIC VALVE:  The pulmonic valve is normal in structure and function with no regurgitation.  There were no vegetations or stenosis.   11. AORTIC ARCH, ASCENDING AND DESCENDING AORTA:  There was grade 1 Ron Parker et. Al, 1992) atherosclerosis of the ascending aorta, aortic arch, or proximal descending aorta.  12. PULMONARY VEINS: Anomalous pulmonary venous return was not noted.  13. PERICARDIUM: The pericardium appeared normal and non-thickened.  There is no significant pericardial effusion.  IMPRESSION:   1. No LAA thrombus 2. Negative for PFO 3. Moderate to severe biatrial enlargement, prominent eustachian valve 4. Moderate LVH 5. LVEF 60-65%  RECOMMENDATIONS:    1.  No LAA thrombus noted. Proceed with work-up for planned afib ablation.  Time Spent Directly with the Patient:  45 minutes   Pixie Casino, MD, [facility], [place] Director of the Advanced  Lipid Disorders &  Cardiovascular Risk Reduction Clinic Diplomate of the American Board of Clinical Lipidology Attending Cardiologist  Direct Dial: [phone]  Fax: [phone]  Website:  [email or web address]  Jheri Mitter 11/11/2018, 10:56 AM

## 2018-11-11 NOTE — Anesthesia Preprocedure Evaluation (Addendum)
Anesthesia Evaluation  Patient identified by MRN, date of birth, ID band Patient awake    Reviewed: Allergy & Precautions, NPO status , Patient's Chart, lab work & pertinent test results, reviewed documented beta blocker date and time   Airway Mallampati: I  TM Distance: >3 FB Neck ROM: Full    Dental no notable dental hx. (+) Edentulous Upper, Missing, Dental Advisory Given,    Pulmonary neg pulmonary ROS, former smoker,    Pulmonary exam normal breath sounds clear to auscultation       Cardiovascular hypertension, Pt. on medications and Pt. on home beta blockers Normal cardiovascular exam+ dysrhythmias Atrial Fibrillation  Rhythm:Irregular Rate:Normal  Cardiac CT 10/2018 1. There is normal pulmonary vein drainage into the left atrium. 2. The left atrial appendage is large with a filling defect at the most anterior portion measuring 24 x 13 mm that persists on delayed imaging. This is highly suspicious for a left atrial appendage thrombus. TEE is recommended prior to the ablation. 3. The esophagus runs to the left from the left atrial midline and is in the proximity to the ostium of the LLPV. 4. Coronary Arteries: Normal coronary origin. Right dominance. Calcium score is 0. There is no evidence for a CAD.  TEE 10/2018 LVEF 60-65%, moderate LVH, severe LAE, no LAA thrombus, negative for PFO, prominent eustachian valve   Neuro/Psych CVA, No Residual Symptoms negative psych ROS   GI/Hepatic negative GI ROS, Neg liver ROS,   Endo/Other  negative endocrine ROS  Renal/GU negative Renal ROS  negative genitourinary   Musculoskeletal negative musculoskeletal ROS (+)   Abdominal   Peds  Hematology negative hematology ROS (+)   Anesthesia Other Findings   Reproductive/Obstetrics                            Anesthesia Physical Anesthesia Plan  ASA: II  Anesthesia Plan: General   Post-op Pain  Management:    Induction: Intravenous  PONV Risk Score and Plan: 3 and Midazolam, Ondansetron and Dexamethasone  Airway Management Planned: Oral ETT  Additional Equipment:   Intra-op Plan:   Post-operative Plan: Extubation in OR  Informed Consent: I have reviewed the patients History and Physical, chart, labs and discussed the procedure including the risks, benefits and alternatives for the proposed anesthesia with the patient or authorized representative who has indicated his/her understanding and acceptance.     Dental advisory given  Plan Discussed with: CRNA  Anesthesia Plan Comments:         Anesthesia Quick Evaluation

## 2018-11-11 NOTE — H&P (Signed)
   INTERVAL PROCEDURE H&P  History and Physical Interval Note:  11/11/2018 10:14 AM  [name] has presented today for their planned procedure. The various methods of treatment have been discussed with the patient and family. After consideration of risks, benefits and other options for treatment, the patient has consented to the procedure.  The patients' outpatient history has been reviewed, patient examined, and no change in status from most recent office note within the past 30 days. I have reviewed the patients' chart and labs and will proceed as planned. Questions were answered to the patient's satisfaction.   Pixie Casino, MD, [facility], [place] Director of the Advanced Lipid Disorders &  Cardiovascular Risk Reduction Clinic Diplomate of the American Board of Clinical Lipidology Attending Cardiologist  Direct Dial: [phone]  Fax: [phone]  Website:  [email or web address] 11/11/2018, 10:14 AM

## 2018-11-12 ENCOUNTER — Ambulatory Visit: Payer: PPO | Admitting: Adult Health

## 2018-11-12 ENCOUNTER — Encounter (HOSPITAL_COMMUNITY)
Admission: RE | Disposition: A | Discharge status: Home / Self Care | Payer: Self-pay | Source: Home / Self Care | Attending: Cardiology

## 2018-11-12 ENCOUNTER — Ambulatory Visit (HOSPITAL_COMMUNITY)
Admission: RE | Admit: 2018-11-12 | Discharge: 2018-11-12 | Disposition: A | Discharge status: Home / Self Care | Payer: Medicare HMO | Attending: Cardiology | Admitting: Cardiology

## 2018-11-12 ENCOUNTER — Encounter (HOSPITAL_COMMUNITY): Payer: Self-pay | Admitting: Anesthesiology

## 2018-11-12 ENCOUNTER — Other Ambulatory Visit: Payer: Self-pay

## 2018-11-12 ENCOUNTER — Ambulatory Visit (HOSPITAL_COMMUNITY): Payer: Medicare HMO | Admitting: Anesthesiology

## 2018-11-12 DIAGNOSIS — Z7901 Long term (current) use of anticoagulants: Secondary | ICD-10-CM | POA: Insufficient documentation

## 2018-11-12 DIAGNOSIS — I483 Typical atrial flutter: Secondary | ICD-10-CM | POA: Insufficient documentation

## 2018-11-12 DIAGNOSIS — Z90710 Acquired absence of both cervix and uterus: Secondary | ICD-10-CM | POA: Diagnosis not present

## 2018-11-12 DIAGNOSIS — Z8673 Personal history of transient ischemic attack (TIA), and cerebral infarction without residual deficits: Secondary | ICD-10-CM | POA: Diagnosis not present

## 2018-11-12 DIAGNOSIS — Z87891 Personal history of nicotine dependence: Secondary | ICD-10-CM | POA: Insufficient documentation

## 2018-11-12 DIAGNOSIS — I10 Essential (primary) hypertension: Secondary | ICD-10-CM | POA: Insufficient documentation

## 2018-11-12 DIAGNOSIS — Z79899 Other long term (current) drug therapy: Secondary | ICD-10-CM | POA: Insufficient documentation

## 2018-11-12 DIAGNOSIS — I428 Other cardiomyopathies: Secondary | ICD-10-CM | POA: Diagnosis not present

## 2018-11-12 DIAGNOSIS — Z91040 Latex allergy status: Secondary | ICD-10-CM | POA: Diagnosis not present

## 2018-11-12 DIAGNOSIS — Z823 Family history of stroke: Secondary | ICD-10-CM | POA: Diagnosis not present

## 2018-11-12 DIAGNOSIS — Z6834 Body mass index (BMI) 34.0-34.9, adult: Secondary | ICD-10-CM | POA: Diagnosis not present

## 2018-11-12 DIAGNOSIS — I4891 Unspecified atrial fibrillation: Secondary | ICD-10-CM | POA: Diagnosis not present

## 2018-11-12 DIAGNOSIS — Z8249 Family history of ischemic heart disease and other diseases of the circulatory system: Secondary | ICD-10-CM | POA: Diagnosis not present

## 2018-11-12 DIAGNOSIS — E669 Obesity, unspecified: Secondary | ICD-10-CM | POA: Diagnosis not present

## 2018-11-12 DIAGNOSIS — I4819 Other persistent atrial fibrillation: Secondary | ICD-10-CM | POA: Insufficient documentation

## 2018-11-12 DIAGNOSIS — I639 Cerebral infarction, unspecified: Secondary | ICD-10-CM | POA: Diagnosis not present

## 2018-11-12 HISTORY — PX: ATRIAL FIBRILLATION ABLATION: EP1191

## 2018-11-12 LAB — POCT ACTIVATED CLOTTING TIME
ACTIVATED CLOTTING TIME: 307 s
ACTIVATED CLOTTING TIME: 301 s
Activated Clotting Time: 323 seconds
Activated Clotting Time: 153 seconds

## 2018-11-12 SURGERY — ATRIAL FIBRILLATION ABLATION
Anesthesia: General

## 2018-11-12 MED ORDER — SUGAMMADEX SODIUM 200 MG/2ML IV SOLN
INTRAVENOUS | Status: DC | PRN
  Administered 2018-11-12: 177 mg via INTRAVENOUS

## 2018-11-12 MED ORDER — ONDANSETRON HCL 4 MG/2ML IJ SOLN
INTRAMUSCULAR | Status: DC | PRN
  Administered 2018-11-12: 4 mg via INTRAVENOUS

## 2018-11-12 MED ORDER — FENTANYL CITRATE (PF) 100 MCG/2ML IJ SOLN
INTRAMUSCULAR | Status: DC | PRN
  Administered 2018-11-12: 50 ug via INTRAVENOUS

## 2018-11-12 MED ORDER — ACETAMINOPHEN 325 MG PO TABS
650.0000 mg | ORAL_TABLET | ORAL | Status: DC | PRN
  Filled 2018-11-12: qty 2

## 2018-11-12 MED ORDER — METOPROLOL TARTRATE 5 MG/5ML IV SOLN
INTRAVENOUS | Status: AC
  Administered 2018-11-12: 5 mg via INTRAVENOUS
  Filled 2018-11-12: qty 5

## 2018-11-12 MED ORDER — DOBUTAMINE IN D5W 4-5 MG/ML-% IV SOLN
INTRAVENOUS | Status: DC | PRN
  Administered 2018-11-12: 20 ug/kg/min via INTRAVENOUS

## 2018-11-12 MED ORDER — HEPARIN (PORCINE) IN NACL 1000-0.9 UT/500ML-% IV SOLN
INTRAVENOUS | Status: DC | PRN
  Administered 2018-11-12 (×3): 500 mL

## 2018-11-12 MED ORDER — HEPARIN SODIUM (PORCINE) 1000 UNIT/ML IJ SOLN
INTRAMUSCULAR | Status: AC
  Filled 2018-11-12: qty 1

## 2018-11-12 MED ORDER — BUPIVACAINE HCL (PF) 0.25 % IJ SOLN
INTRAMUSCULAR | Status: DC | PRN
  Administered 2018-11-12: 30 mL

## 2018-11-12 MED ORDER — METOPROLOL TARTRATE 5 MG/5ML IV SOLN
5.0000 mg | Freq: Once | INTRAVENOUS | Status: AC
  Administered 2018-11-12: 5 mg via INTRAVENOUS

## 2018-11-12 MED ORDER — PROPOFOL 10 MG/ML IV BOLUS
INTRAVENOUS | Status: DC | PRN
  Administered 2018-11-12: 100 mg via INTRAVENOUS

## 2018-11-12 MED ORDER — SODIUM CHLORIDE 0.9% FLUSH: 3.0000 mL | INTRAVENOUS | Status: DC | PRN

## 2018-11-12 MED ORDER — LIDOCAINE HCL (CARDIAC) PF 100 MG/5ML IV SOSY
PREFILLED_SYRINGE | INTRAVENOUS | Status: DC | PRN
  Administered 2018-11-12: 80 mg via INTRAVENOUS

## 2018-11-12 MED ORDER — SODIUM CHLORIDE 0.9 % IV SOLN
INTRAVENOUS | Status: DC
  Administered 2018-11-12: 06:00:00 via INTRAVENOUS

## 2018-11-12 MED ORDER — SODIUM CHLORIDE 0.9% FLUSH: 3.0000 mL | Freq: Two times a day (BID) | INTRAVENOUS | Status: DC

## 2018-11-12 MED ORDER — SODIUM CHLORIDE 0.9 % IV SOLN: 250.0000 mL | INTRAVENOUS | Status: DC | PRN

## 2018-11-12 MED ORDER — ONDANSETRON HCL 4 MG/2ML IJ SOLN: 4.0000 mg | Freq: Four times a day (QID) | INTRAMUSCULAR | Status: DC | PRN

## 2018-11-12 MED ORDER — PROTAMINE SULFATE 10 MG/ML IV SOLN
INTRAVENOUS | Status: DC | PRN
  Administered 2018-11-12 (×5): 10 mg via INTRAVENOUS

## 2018-11-12 MED ORDER — ROCURONIUM BROMIDE 50 MG/5ML IV SOSY
PREFILLED_SYRINGE | INTRAVENOUS | Status: DC | PRN
  Administered 2018-11-12: 50 mg via INTRAVENOUS
  Administered 2018-11-12 (×2): 10 mg via INTRAVENOUS

## 2018-11-12 MED ORDER — HEPARIN SODIUM (PORCINE) 1000 UNIT/ML IJ SOLN
INTRAMUSCULAR | Status: DC | PRN
  Administered 2018-11-12: 1000 [IU] via INTRAVENOUS

## 2018-11-12 MED ORDER — DEXAMETHASONE SODIUM PHOSPHATE 10 MG/ML IJ SOLN
INTRAMUSCULAR | Status: DC | PRN
  Administered 2018-11-12: 10 mg via INTRAVENOUS

## 2018-11-12 MED ORDER — ACETAMINOPHEN 500 MG PO TABS
1000.0000 mg | ORAL_TABLET | Freq: Once | ORAL | Status: AC
  Administered 2018-11-12: 1000 mg via ORAL
  Filled 2018-11-12: qty 2

## 2018-11-12 MED ORDER — HEPARIN (PORCINE) IN NACL 1000-0.9 UT/500ML-% IV SOLN
INTRAVENOUS | Status: DC | PRN
  Administered 2018-11-12 (×2): 500 mL

## 2018-11-12 MED ORDER — SODIUM CHLORIDE 0.9 % IV SOLN
INTRAVENOUS | Status: DC | PRN
  Administered 2018-11-12: 15 ug/min via INTRAVENOUS

## 2018-11-12 MED ORDER — HEPARIN SODIUM (PORCINE) 1000 UNIT/ML IJ SOLN
INTRAMUSCULAR | Status: DC | PRN
  Administered 2018-11-12: 14000 [IU] via INTRAVENOUS
  Administered 2018-11-12: 2000 [IU] via INTRAVENOUS
  Administered 2018-11-12: 3000 [IU] via INTRAVENOUS

## 2018-11-12 MED ORDER — BUPIVACAINE HCL (PF) 0.25 % IJ SOLN
INTRAMUSCULAR | Status: AC
  Filled 2018-11-12: qty 30

## 2018-11-12 SURGICAL SUPPLY — 18 items
CATH MAPPNG PENTARAY F 2-6-2MM (CATHETERS) IMPLANT
CATH SMTCH THERMOCOOL SF DF (CATHETERS) ×2 IMPLANT
CATH SOUNDSTAR 3D IMAGING (CATHETERS) ×2 IMPLANT
CATH WEBSTER BI DIR CS D-F CRV (CATHETERS) ×1 IMPLANT
PACK EP LATEX FREE (CUSTOM PROCEDURE TRAY) ×2
PACK EP LF (CUSTOM PROCEDURE TRAY) ×1 IMPLANT
PAD PRO RADIOLUCENT 2001M-C (PAD) ×2 IMPLANT
PATCH CARTO3 (PAD) ×1 IMPLANT
PENTARAY F 2-6-2MM (CATHETERS) ×2
SHEATH AVANTI 11F 11CM (SHEATH) ×1 IMPLANT
SHEATH BAYLIS SUREFLEX  M 8.5 (SHEATH) ×1
SHEATH BAYLIS SUREFLEX M 8.5 (SHEATH) IMPLANT
SHEATH BAYLIS TRANSSEPTAL 98CM (NEEDLE) ×2 IMPLANT
SHEATH CARTO VIZIGO SM CVD (SHEATH) ×1 IMPLANT
SHEATH PINNACLE 7F 10CM (SHEATH) ×1 IMPLANT
SHEATH PINNACLE 8F 10CM (SHEATH) ×2 IMPLANT
SHEATH PINNACLE 9F 10CM (SHEATH) ×2 IMPLANT
TUBING SMART ABLATE COOLFLOW (TUBING) ×1 IMPLANT

## 2018-11-12 NOTE — Progress Notes (Signed)
Dr Curt Bears notified that client's heart rate decreased to 70's after lopressor IV and now heart rate up to 124 again; per Dr Curt Bears okay to d/c home; client up and walked and tolerated well; bilat groins stable, no bleeding or hematoma

## 2018-11-12 NOTE — CV Procedure (Signed)
   F  2 X 9 FR she'ths in R F/V, and  7 Fr & 11 Fr sheat's were pulled manually, and pressure was held for 2 X 20 min. Both groins are soft and non tender. Sterile dressing was applied on both groins. R and L DP was palpable. Bed rest started at 1230 X 6 hr. Bed rest instructions were given to to the patient.  BP 125/67 HR 82 NSR sPO2 95 % on R/A

## 2018-11-12 NOTE — H&P (Signed)
[name] has presented today for surgery, with the diagnosis of atrial fibrillation.  The various methods of treatment have been discussed with the patient and family. After consideration of risks, benefits and other options for treatment, the patient has consented to  Procedure(s): Catheter ablation as a surgical intervention .  Risks include but not limited to bleeding, tamponade, heart block, stroke, damage to surrounding organs, among others. The patient's history has been reviewed, patient examined, no change in status, stable for surgery.  I have reviewed the patient's chart and labs.  Questions were answered to the patient's satisfaction.    Cristie Mckinney Curt Bears, MD 11/12/2018 7:05 AM

## 2018-11-12 NOTE — Discharge Instructions (Signed)
Femoral Site Care °This sheet gives you information about how to care for yourself after your procedure. Your health care provider may also give you more specific instructions. If you have problems or questions, contact your health care provider. °What can I expect after the procedure? °After the procedure, it is common to have: °· Bruising that usually fades within 1-2 weeks. °· Tenderness at the site. °Follow these instructions at home: °Wound care °· Follow instructions from your health care provider about how to take care of your insertion site. Make sure you: °? Wash your hands with soap and water before you change your bandage (dressing). If soap and water are not available, use hand sanitizer. °? Change your dressing as told by your health care provider. °? Leave stitches (sutures), skin glue, or adhesive strips in place. These skin closures may need to stay in place for 2 weeks or longer. If adhesive strip edges start to loosen and curl up, you may trim the loose edges. Do not remove adhesive strips completely unless your health care provider tells you to do that. °· Do not take baths, swim, or use a hot tub until your health care provider approves. °· You may shower 24-48 hours after the procedure or as told by your health care provider. °? Gently wash the site with plain soap and water. °? Pat the area dry with a clean towel. °? Do not rub the site. This may cause bleeding. °· Do not apply powder or lotion to the site. Keep the site clean and dry. °· Check your femoral site every day for signs of infection. Check for: °? Redness, swelling, or pain. °? Fluid or blood. °? Warmth. °? Pus or a bad smell. °Activity °· For the first 2-3 days after your procedure, or as long as directed: °? Avoid climbing stairs as much as possible. °? Do not squat. °· Do not lift anything that is heavier than 10 lb (4.5 kg), or the limit that you are told, until your health care provider says that it is safe. °· Rest as  directed. °? Avoid sitting for a long time without moving. Get up to take short walks every 1-2 hours. °· Do not drive for 24 hours if you were given a medicine to help you relax (sedative). °General instructions °· Take over-the-counter and prescription medicines only as told by your health care provider. °· Keep all follow-up visits as told by your health care provider. This is important. °Contact a health care provider if you have: °· A fever or chills. °· You have redness, swelling, or pain around your insertion site. °Get help right away if: °· The catheter insertion area swells very fast. °· You pass out. °· You suddenly start to sweat or your skin gets clammy. °· The catheter insertion area is bleeding, and the bleeding does not stop when you hold steady pressure on the area. °· The area near or just beyond the catheter insertion site becomes pale, cool, tingly, or numb. °These symptoms may represent a serious problem that is an emergency. Do not wait to see if the symptoms will go away. Get medical help right away. Call your local emergency services (911 in the [place] Do not drive yourself to the hospital. °Summary °· After the procedure, it is common to have bruising that usually fades within 1-2 weeks. °· Check your femoral site every day for signs of infection. °· Do not lift anything that is heavier than 10 lb (4.5 kg), or the   limit that you are told, until your health care provider says that it is safe. This information is not intended to replace advice given to you by your health care provider. Make sure you discuss any questions you have with your health care provider. Document Released: 05/06/2014 Document Revised: 09/15/2017 Document Reviewed: 09/15/2017 Elsevier Interactive Patient Education  2019 [place] procedure care instructions No driving for 4 days. No lifting over 5 lbs for 1 week. No vigorous or sexual activity for 1 week. You may return to work on 11/19/2018. Keep  procedure site clean & dry. If you notice increased pain, swelling, bleeding or pus, call/return!  You may shower, but no soaking baths/hot tubs/pools for 1 week.   You have an appointment set up with the [facility].  Multiple studies have shown that being followed by a dedicated atrial fibrillation clinic in addition to the standard care you receive from your other physicians improves health. We believe that enrollment in the atrial fibrillation clinic will allow [place] to better care for you.   The phone number to the [facility] is [phone]. The clinic is staffed Monday through Friday from 8:30am to 5pm.  Parking Directions: The clinic is located in the Heart and Vascular Building connected to [facility]. 1)From [address] turn on to [address] and go to the 3rd entrance  (Heart and Vascular entrance) on the right. 2)Look to the right for Heart &Vascular Parking Garage. 3)A code for the entrance is required please call the clinic to receive this.   4)Take the elevators to the 1st floor. Registration is in the room with the glass walls at the end of the hallway.  If you have any trouble parking or locating the clinic, please dont hesitate to call [phone].

## 2018-11-12 NOTE — Transfer of Care (Signed)
Immediate Anesthesia Transfer of Care Note  Patient: [name]  Procedure(s) Performed: ATRIAL FIBRILLATION ABLATION (N/A )  Patient Location: PACU  Anesthesia Type:General  Level of Consciousness: awake and alert   Airway & Oxygen Therapy: Patient Spontanous Breathing and Patient connected to nasal cannula oxygen  Post-op Assessment: Report given to RN and Post -op Vital signs reviewed and stable  Post vital signs: Reviewed and stable  Last Vitals:  Vitals Value Taken Time  BP    Temp    Pulse 75 11/12/2018 11:22 AM  Resp 20 11/12/2018 11:22 AM  SpO2 96 % 11/12/2018 11:22 AM  Vitals shown include unvalidated device data.  Last Pain:  Vitals:   11/12/18 0605  TempSrc:   PainSc: 0-No pain         Complications: No apparent anesthesia complications

## 2018-11-12 NOTE — Anesthesia Procedure Notes (Signed)
Procedure Name: Intubation Date/Time: 11/12/2018 7:52 AM Performed by: Eligha Bridegroom, CRNA Pre-anesthesia Checklist: Patient identified, Emergency Drugs available, Suction available and Patient being monitored Patient Re-evaluated:Patient Re-evaluated prior to induction Oxygen Delivery Method: Circle system utilized Preoxygenation: Pre-oxygenation with 100% oxygen Induction Type: IV induction Ventilation: Mask ventilation without difficulty Laryngoscope Size: Mac and 4 Grade View: Grade I Tube type: Oral Tube size: 7.0 mm Number of attempts: 1 Airway Equipment and Method: Stylet Placement Confirmation: ETT inserted through vocal cords under direct vision,  positive ETCO2 and breath sounds checked- equal and bilateral Secured at: 21 cm Tube secured with: Tape Dental Injury: Teeth and Oropharynx as per pre-operative assessment

## 2018-11-13 ENCOUNTER — Encounter (HOSPITAL_COMMUNITY): Payer: Self-pay | Admitting: Cardiology

## 2018-11-13 NOTE — Anesthesia Postprocedure Evaluation (Signed)
Anesthesia Post Note  Patient: Engineer, water  Procedure(s) Performed: ATRIAL FIBRILLATION ABLATION (N/A )     Patient location during evaluation: PACU Anesthesia Type: General Level of consciousness: awake and alert Pain management: pain level controlled Vital Signs Assessment: post-procedure vital signs reviewed and stable Respiratory status: spontaneous breathing, nonlabored ventilation, respiratory function stable and patient connected to nasal cannula oxygen Cardiovascular status: blood pressure returned to baseline and stable Postop Assessment: no apparent nausea or vomiting Anesthetic complications: no    Last Vitals:  Vitals:   11/12/18 1647 11/12/18 1800  BP:  (!) 153/85  Pulse: 75 (!) 124  Resp: 18 18  Temp:    SpO2: 94% 98%    Last Pain:  Vitals:   11/12/18 1258  TempSrc:   PainSc: 0-No pain                 Jaclyne Haverstick L Dannielle Baskins

## 2018-11-18 NOTE — Addendum Note (Signed)
Addendum  created 11/18/18 1434 by Freddrick March, MD   Intraprocedure Staff edited

## 2018-11-20 ENCOUNTER — Encounter (HOSPITAL_COMMUNITY): Payer: Self-pay | Admitting: Nurse Practitioner

## 2018-11-20 ENCOUNTER — Ambulatory Visit (HOSPITAL_COMMUNITY)
Admission: RE | Admit: 2018-11-20 | Discharge: 2018-11-20 | Disposition: A | Discharge status: Home / Self Care | Payer: Medicare HMO | Source: Ambulatory Visit | Attending: Physician Assistant | Admitting: Physician Assistant

## 2018-11-20 VITALS — BP 130/72 | HR 91 | Ht 63.0 in | Wt 194.0 lb

## 2018-11-20 DIAGNOSIS — Z79899 Other long term (current) drug therapy: Secondary | ICD-10-CM | POA: Insufficient documentation

## 2018-11-20 DIAGNOSIS — Z91040 Latex allergy status: Secondary | ICD-10-CM | POA: Insufficient documentation

## 2018-11-20 DIAGNOSIS — I4892 Unspecified atrial flutter: Secondary | ICD-10-CM | POA: Diagnosis not present

## 2018-11-20 DIAGNOSIS — R9431 Abnormal electrocardiogram [ECG] [EKG]: Secondary | ICD-10-CM | POA: Insufficient documentation

## 2018-11-20 DIAGNOSIS — I4891 Unspecified atrial fibrillation: Secondary | ICD-10-CM | POA: Diagnosis not present

## 2018-11-20 DIAGNOSIS — I480 Paroxysmal atrial fibrillation: Secondary | ICD-10-CM

## 2018-11-20 DIAGNOSIS — E669 Obesity, unspecified: Secondary | ICD-10-CM | POA: Diagnosis not present

## 2018-11-20 DIAGNOSIS — Z7901 Long term (current) use of anticoagulants: Secondary | ICD-10-CM | POA: Insufficient documentation

## 2018-11-20 DIAGNOSIS — Z6834 Body mass index (BMI) 34.0-34.9, adult: Secondary | ICD-10-CM | POA: Insufficient documentation

## 2018-11-20 DIAGNOSIS — Z87891 Personal history of nicotine dependence: Secondary | ICD-10-CM | POA: Insufficient documentation

## 2018-11-20 DIAGNOSIS — Z833 Family history of diabetes mellitus: Secondary | ICD-10-CM | POA: Diagnosis not present

## 2018-11-20 DIAGNOSIS — I10 Essential (primary) hypertension: Secondary | ICD-10-CM | POA: Insufficient documentation

## 2018-11-20 DIAGNOSIS — Z8249 Family history of ischemic heart disease and other diseases of the circulatory system: Secondary | ICD-10-CM | POA: Insufficient documentation

## 2018-11-20 NOTE — Progress Notes (Signed)
Primary Care Physician: [name], L.Marlou Sa, MD Referring Physician: Dr. Rainey Nielsen is a 68 y.o. female with a h/o afib/flutter, HTN, previous CVA, that is in the afib clinic for f/u of ablation done 11/11/18. She was asked to  f/u in one week for arrhythmia noted at discharge. She is in rhythm today and has been feeling well. She continues on Tikosyn 250 mcg bid. No swallowing or groin issues.  Today, she denies symptoms of palpitations, chest pain, shortness of breath, orthopnea, PND, lower extremity edema, dizziness, presyncope, syncope, or neurologic sequela. The patient is tolerating medications without difficulties and is otherwise without complaint today.   Past Medical History:  Diagnosis Date  . Atrial fibrillation ([place])   . Atrial flutter ([place])   . Hypertension   . Left ventricular hypertrophy    possibly hypertrophic cardiomyopathy  . Obesity   . Stroke [facility])    Past Surgical History:  Procedure Laterality Date  . ABDOMINAL HYSTERECTOMY    . ATRIAL FIBRILLATION ABLATION N/A 11/12/2018   Procedure: ATRIAL FIBRILLATION ABLATION;  Surgeon: Bethany Haw, MD;  Location: [place] CV LAB;  Service: Cardiovascular;  Laterality: N/A;  . TEE WITHOUT CARDIOVERSION N/A 11/11/2018   Procedure: TRANSESOPHAGEAL ECHOCARDIOGRAM (TEE);  Surgeon: Bethany Casino, MD;  Location: [facility] ENDOSCOPY;  Service: Cardiovascular;  Laterality: N/A;    Current Outpatient Medications  Medication Sig Dispense Refill  . atorvastatin (LIPITOR) 40 MG tablet Take 1 tablet (40 mg total) by mouth daily at 6 PM. 90 tablet 3  . clotrimazole (ATHLETES FOOT) 1 % cream Apply 1 application topically 2 (two) times daily as needed (skin irritation).    . dofetilide (TIKOSYN) 250 MCG capsule Take 1 capsule (250 mcg total) by mouth 2 (two) times daily. 180 capsule 3  . hydrocortisone 1 % lotion Apply 1 application topically daily. For dry skin on face.     Marland Kitchen KLOR-CON M20 20 MEQ tablet TAKE 1  TABLET BY MOUTH EVERY DAY (Patient taking differently: Take 20 mEq by mouth daily. ) 90 tablet 2  . losartan (COZAAR) 100 MG tablet TAKE 1 TABLET BY MOUTH EVERY DAY (Patient taking differently: Take 100 mg by mouth daily. ) 90 tablet 3  . magnesium oxide (MAG-OX) 400 MG tablet TAKE 2 TABLETS DAILY BY MOUTH. (Patient taking differently: Take 800 mg by mouth every evening. ) 180 tablet 1  . metoprolol tartrate (LOPRESSOR) 25 MG tablet Take 1 tablet (25 mg total) by mouth 2 (two) times daily. 180 tablet 3  . XARELTO 20 MG TABS tablet TAKE 1 TABLET (20 MG TOTAL) BY MOUTH DAILY WITH SUPPER. (Patient taking differently: Take 20 mg by mouth daily at 6 PM. ) 90 tablet 1   No current facility-administered medications for this encounter.     Allergies  Allergen Reactions  . Latex Itching    Social History   Socioeconomic History  . Marital status: Divorced    Spouse name: Not on file  . Number of children: Not on file  . Years of education: Not on file  . Highest education level: Not on file  Occupational History  . Not on file  Social Needs  . Financial resource strain: Not on file  . Food insecurity:    Worry: Not on file    Inability: Not on file  . Transportation needs:    Medical: Not on file    Non-medical: Not on file  Tobacco Use  . Smoking status: Former Smoker  Types: Cigarettes  . Smokeless tobacco: Never Used  Substance and Sexual Activity  . Alcohol use: No  . Drug use: No  . Sexual activity: Not on file  Lifestyle  . Physical activity:    Days per week: Not on file    Minutes per session: Not on file  . Stress: Not on file  Relationships  . Social connections:    Talks on phone: Not on file    Gets together: Not on file    Attends religious service: Not on file    Active member of club or organization: Not on file    Attends meetings of clubs or organizations: Not on file    Relationship status: Not on file  . Intimate partner violence:    Fear of current  or ex partner: Not on file    Emotionally abused: Not on file    Physically abused: Not on file    Forced sexual activity: Not on file  Other Topics Concern  . Not on file  Social History Narrative  . Not on file    Family History  Problem Relation Age of Onset  . Heart failure Mother   . Stroke Paternal Grandfather   . Diabetes Daughter     ROS- All systems are reviewed and negative except as per the HPI above  Physical Exam: Vitals:   11/20/18 1111  BP: 130/72  Pulse: 91  Weight: 88 kg  Height: 5\' 3"  (1.6 m)   Wt Readings from Last 3 Encounters:  11/20/18 88 kg  11/12/18 88.5 kg  11/11/18 88.5 kg    Labs: Lab Results  Component Value Date   NA 140 10/21/2018   K 4.4 10/21/2018   CL 103 10/21/2018   CO2 24 10/21/2018   GLUCOSE 103 (H) 10/21/2018   BUN 20 10/21/2018   CREATININE 0.97 10/21/2018   CALCIUM 10.0 10/21/2018   MG 2.1 12/10/2017   Lab Results  Component Value Date   INR 1.48 09/10/2017   Lab Results  Component Value Date   CHOL 136 09/11/2017   HDL 37 (L) 09/11/2017   LDLCALC 88 09/11/2017   TRIG 53 09/11/2017     GEN- The patient is well appearing, alert and oriented x 3 today.   Head- normocephalic, atraumatic Eyes-  Sclera clear, conjunctiva pink Ears- hearing intact Oropharynx- clear Neck- supple, no JVP Lymph- no cervical lymphadenopathy Lungs- Clear to ausculation bilaterally, normal work of breathing Heart- Regular rate and rhythm, no murmurs, rubs or gallops, PMI not laterally displaced GI- soft, NT, ND, + BS Extremities- no clubbing, cyanosis, or edema MS- no significant deformity or atrophy Skin- no rash or lesion Psych- euthymic mood, full affect Neuro- strength and sensation are intact  EKG-Sinus rhythm with first degree AV block, marked st/t wave abnormality    Assessment and Plan: 1. afib  Recent ablation with arrythmia after procedure Appears to be SR vrs  an ectopic pacemaker, will review with Dr. Curt Bears Pt  feels well, no adverse effects from procedure Continue Tikosyn 250 mcg bid Continue metoprolol 25 mg bid Continue  xarelto 20 mg daily  [name] C. [name], [facility] [address] [place],  [address] [phone]

## 2018-12-10 ENCOUNTER — Other Ambulatory Visit: Payer: Self-pay

## 2018-12-10 ENCOUNTER — Ambulatory Visit (HOSPITAL_COMMUNITY)
Admission: RE | Admit: 2018-12-10 | Discharge: 2018-12-10 | Disposition: A | Discharge status: Home / Self Care | Payer: Medicare HMO | Source: Ambulatory Visit | Attending: Nurse Practitioner | Admitting: Nurse Practitioner

## 2018-12-10 ENCOUNTER — Encounter (HOSPITAL_COMMUNITY): Payer: Self-pay | Admitting: Nurse Practitioner

## 2018-12-10 VITALS — BP 118/74 | HR 100 | Ht 63.0 in | Wt 189.0 lb

## 2018-12-10 DIAGNOSIS — Z91040 Latex allergy status: Secondary | ICD-10-CM | POA: Insufficient documentation

## 2018-12-10 DIAGNOSIS — Z87891 Personal history of nicotine dependence: Secondary | ICD-10-CM | POA: Insufficient documentation

## 2018-12-10 DIAGNOSIS — Z79899 Other long term (current) drug therapy: Secondary | ICD-10-CM | POA: Insufficient documentation

## 2018-12-10 DIAGNOSIS — Z833 Family history of diabetes mellitus: Secondary | ICD-10-CM | POA: Insufficient documentation

## 2018-12-10 DIAGNOSIS — I10 Essential (primary) hypertension: Secondary | ICD-10-CM | POA: Insufficient documentation

## 2018-12-10 DIAGNOSIS — Z6833 Body mass index (BMI) 33.0-33.9, adult: Secondary | ICD-10-CM | POA: Diagnosis not present

## 2018-12-10 DIAGNOSIS — Z8673 Personal history of transient ischemic attack (TIA), and cerebral infarction without residual deficits: Secondary | ICD-10-CM | POA: Insufficient documentation

## 2018-12-10 DIAGNOSIS — E669 Obesity, unspecified: Secondary | ICD-10-CM | POA: Insufficient documentation

## 2018-12-10 DIAGNOSIS — Z7901 Long term (current) use of anticoagulants: Secondary | ICD-10-CM | POA: Insufficient documentation

## 2018-12-10 DIAGNOSIS — I4892 Unspecified atrial flutter: Secondary | ICD-10-CM | POA: Diagnosis not present

## 2018-12-10 DIAGNOSIS — I480 Paroxysmal atrial fibrillation: Secondary | ICD-10-CM | POA: Diagnosis not present

## 2018-12-10 DIAGNOSIS — Z8249 Family history of ischemic heart disease and other diseases of the circulatory system: Secondary | ICD-10-CM | POA: Diagnosis not present
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
## 2018-12-14 DIAGNOSIS — M25511 Pain in right shoulder: Secondary | ICD-10-CM | POA: Diagnosis not present

## 2018-12-14 DIAGNOSIS — R110 Nausea: Secondary | ICD-10-CM | POA: Diagnosis not present

## 2018-12-23 ENCOUNTER — Telehealth: Payer: Self-pay

## 2018-12-23 NOTE — Telephone Encounter (Signed)
Virtual Visit Pre-Appointment Phone Call  Steps For Call: [place] PHONE  1. Confirm consent - "In the setting of the current Covid19 crisis, you are scheduled for a (phone or video) visit with your provider on (date) at (time).  Just as we do with many in-office visits, in order for you to participate in this visit, we must obtain consent.  If you'd like, I can send this to your mychart (if signed up) or email for you to review.  Otherwise, I can obtain your verbal consent now.  All virtual visits are billed to your insurance company just like a normal visit would be.  By agreeing to a virtual visit, we'd like you to understand that the technology does not allow for your provider to perform an examination, and thus may limit your provider's ability to fully assess your condition.  Finally, though the technology is pretty good, we cannot assure that it will always work on either your or our end, and in the setting of a video visit, we may have to convert it to a phone-only visit.  In either situation, we cannot ensure that we have a secure connection.  Are you willing to proceed?"  2. Give patient instructions for WebEx download to smartphone as below if video visit  3. Advise patient to be prepared with any vital sign or heart rhythm information, their current medicines, and a piece of paper and pen handy for any instructions they may receive the day of their visit  4. Inform patient they will receive a phone call 15 minutes prior to their appointment time (may be from unknown caller ID) so they should be prepared to answer  5. Confirm that appointment type is correct in Epic appointment notes (video vs telephone)    TELEPHONE CALL NOTE  [name] has been deemed a candidate for a follow-up tele-health visit to limit community exposure during the Covid-19 pandemic. I spoke with the patient via phone to ensure availability of phone/video source, confirm preferred email & phone  number, and discuss instructions and expectations.  I reminded [name] to be prepared with any vital sign and/or heart rhythm information that could potentially be obtained via home monitoring, at the time of her visit. I reminded [name] to expect a phone call at the time of her visit if her visit.  Did the patient verbally acknowledge consent to treatment?   [place] 12/23/2018 9:59 AM   DOWNLOADING THE WEBEX SOFTWARE TO SMARTPHONE  - If Apple, go to [employer] and type in WebEx in the search bar. [place] [employer], the blue/Deaken Jurgens circle. The app is free but as with any other app downloads, their phone may require them to verify saved payment information or Apple password. The patient does NOT have to create an account.  - If Android, ask patient to go to [employer] and type in WebEx in the search bar. [place] [employer], the blue/Ellanor Feuerstein circle. The app is free but as with any other app downloads, their phone may require them to verify saved payment information or Android password. The patient does NOT have to create an account.   CONSENT FOR TELE-HEALTH VISIT - PLEASE REVIEW  I hereby voluntarily request, consent and authorize [facility] and its employed or contracted physicians, physician assistants, nurse practitioners or other licensed health care professionals (the Practitioner), to provide me with telemedicine health care services (the "Services") as deemed necessary by the treating  Practitioner. I acknowledge and consent to receive the Services by the Practitioner via telemedicine. I understand that the telemedicine visit will involve communicating with the Practitioner through live audiovisual communication technology and the disclosure of certain medical information by electronic transmission. I acknowledge that I have been given the opportunity to request an in-person assessment or other available alternative prior to the  telemedicine visit and am voluntarily participating in the telemedicine visit.  I understand that I have the right to withhold or withdraw my consent to the use of telemedicine in the course of my care at any time, without affecting my right to future care or treatment, and that the Practitioner or I may terminate the telemedicine visit at any time. I understand that I have the right to inspect all information obtained and/or recorded in the course of the telemedicine visit and may receive copies of available information for a reasonable fee.  I understand that some of the potential risks of receiving the Services via telemedicine include:  Marland Kitchen Delay or interruption in medical evaluation due to technological equipment failure or disruption; . Information transmitted may not be sufficient (e.g. poor resolution of images) to allow for appropriate medical decision making by the Practitioner; and/or  . In rare instances, security protocols could fail, causing a breach of personal health information.  Furthermore, I acknowledge that it is my responsibility to provide information about my medical history, conditions and care that is complete and accurate to the best of my ability. I acknowledge that Practitioner's advice, recommendations, and/or decision may be based on factors not within their control, such as incomplete or inaccurate data provided by me or distortions of diagnostic images or specimens that may result from electronic transmissions. I understand that the practice of medicine is not an exact science and that Practitioner makes no warranties or guarantees regarding treatment outcomes. I acknowledge that I will receive a copy of this consent concurrently upon execution via email to the email address I last provided but may also request a printed copy by calling the office of [place].    I understand that my insurance will be billed for this visit.   I have read or had this consent read to me.  . I understand the contents of this consent, which adequately explains the benefits and risks of the Services being provided via telemedicine.  . I have been provided ample opportunity to ask questions regarding this consent and the Services and have had my questions answered to my satisfaction. . I give my informed consent for the services to be provided through the use of telemedicine in my medical care  By participating in this telemedicine visit I agree to the above.

## 2018-12-27 NOTE — Progress Notes (Signed)
Virtual Visit via Video Note   This visit type was conducted due to national recommendations for restrictions regarding the COVID-19 Pandemic (e.g. social distancing) in an effort to limit this patient's exposure and mitigate transmission in our community.  Due to her co-morbid illnesses, this patient is at least at moderate risk for complications without adequate follow up.  This format is felt to be most appropriate for this patient at this time.  All issues noted in this document were discussed and addressed.  A limited physical exam was performed with this format.  Please refer to the patient's chart for her consent to telehealth for [facility].   Evaluation Performed:  Follow-up visit  Date:  12/28/2018   ID:  [name], [place] [birth year], MRN [id number]  Patient Location: Home  Provider Location: Home  PCP:  Alroy Dust, L.Marlou Sa, MD  Cardiologist:  Minus Breeding, MD  Electrophysiologist:  Will Meredith Leeds, MD   Chief Complaint:  Palpitations  History of Present Illness:    [name] is a 68 y.o. female who presents via audio/video conferencing for a telehealth visit today.    The patient presents for followup of atrial fibrillation and hypertrophic cardiomyopathy.  She has been followed in the atrial fib clinic.  She had ablation of her atrial fib in February.   Prior to this she had a CT and a TEE.     Following the CT the patient seemed to have some reflux.  This is finally started to go away.  She had a decreased appetite and nausea.  She still feels occasional palpitations but does not think it is as bad as it was previously.  She is not having any chest pressure, neck or arm discomfort.  She is not having any PND or orthopnea.  She has no presyncope or syncope.  The patient does not have symptoms concerning for COVID-19 infection (fever, chills, cough, or new shortness of breath).    Past Medical History:  Diagnosis Date  . Atrial fibrillation ([place])   .  Atrial flutter ([place])   . Hypertension   . Left ventricular hypertrophy    possibly hypertrophic cardiomyopathy  . Obesity   . Stroke [facility])    Past Surgical History:  Procedure Laterality Date  . ABDOMINAL HYSTERECTOMY    . ATRIAL FIBRILLATION ABLATION N/A 11/12/2018   Procedure: ATRIAL FIBRILLATION ABLATION;  Surgeon: Constance Haw, MD;  Location: [facility] CV LAB;  Service: Cardiovascular;  Laterality: N/A;  . TEE WITHOUT CARDIOVERSION N/A 11/11/2018   Procedure: TRANSESOPHAGEAL ECHOCARDIOGRAM (TEE);  Surgeon: Pixie Casino, MD;  Location: [facility] ENDOSCOPY;  Service: Cardiovascular;  Laterality: N/A;     Current Meds  Medication Sig  . atorvastatin (LIPITOR) 40 MG tablet Take 1 tablet (40 mg total) by mouth daily at 6 PM.  . clotrimazole (ATHLETES FOOT) 1 % cream Apply 1 application topically 2 (two) times daily as needed (skin irritation).  . dofetilide (TIKOSYN) 250 MCG capsule Take 1 capsule (250 mcg total) by mouth 2 (two) times daily.  . Famotidine (PEPCID AC PO) Take by mouth.  . hydrocortisone 1 % lotion Apply 1 application topically daily. For dry skin on face.   Marland Kitchen KLOR-CON M20 20 MEQ tablet TAKE 1 TABLET BY MOUTH EVERY DAY  . losartan (COZAAR) 100 MG tablet TAKE 1 TABLET BY MOUTH EVERY DAY  . magnesium oxide (MAG-OX) 400 MG tablet TAKE 2 TABLETS DAILY BY MOUTH.  . metoprolol tartrate (LOPRESSOR) 25 MG tablet Take 1 tablet (25 mg  total) by mouth 2 (two) times daily.  [name] 20 MG TABS tablet TAKE 1 TABLET (20 MG TOTAL) BY MOUTH DAILY WITH SUPPER. (Patient taking differently: Take 20 mg by mouth daily at 6 PM. )     Allergies:   Latex   Social History   Tobacco Use  . Smoking status: Former Smoker    Types: Cigarettes  . Smokeless tobacco: Never Used  Substance Use Topics  . Alcohol use: No  . Drug use: No     Family Hx: The patient's family history includes Diabetes in her daughter; Heart failure in her mother; Stroke in her paternal grandfather.  ROS:    Please see the history of present illness.    As stated in the HPI and negative for all other systems.   Prior CV studies:   The following studies were reviewed today: Labs  Labs/Other Tests and Data Reviewed:    EKG:  No ECG reviewed.  Recent Labs: 10/21/2018: BUN 20; Creatinine, Ser 0.97; Hemoglobin 12.6; Platelets 238; Potassium 4.4; Sodium 140   Recent Lipid Panel Lab Results  Component Value Date/Time   CHOL 136 09/11/2017 03:45 AM   TRIG 53 09/11/2017 03:45 AM   HDL 37 (L) 09/11/2017 03:45 AM   CHOLHDL 3.7 09/11/2017 03:45 AM   LDLCALC 88 09/11/2017 03:45 AM    Wt Readings from Last 3 Encounters:  12/28/18 185 lb (83.9 kg)  12/10/18 189 lb (85.7 kg)  11/20/18 194 lb (88 kg)     Objective:    Vital Signs:  BP (!) 140/103   Pulse (!) 120   Ht 5\' 3"  (1.6 m)   Wt 185 lb (83.9 kg)   BMI 32.77 kg/m    Well nourished, well developed female in no acute distress.   ASSESSMENT & PLAN:    Atrial fibrillation -  [name] has a CHA2DS2 - VASc score of 2 with a risk of stroke of 2%.  I could not tell that her heart rate was irregular listening to her blood pressure cuff.  She is going to keep a blood pressure and heart rate as below.  I might need to have her get a pulse ox or come in for an EKG.  For now she will continue the anticoagulation and medications as listed.  Her heart rate was down to 109 when we repeated it.  TIA - She is had no new symptoms.  No change in therapy.  HYPERTENSION, BENIGN -  Her blood pressure is  not at target but she did not take her medication yet this morning.  She is getting keep a 10-day blood pressure diary.  Might need to increase her beta-blocker.   HYPERTROPHIC CARDIOMYOPATHY  This may have been related to difficult to control hypertension.  It was moderate on recent echo.    PULMONARY HTN This was not mentioned on the TEE done in Feb.  I will follow this up with repeat echocardiograms in the future.   OVERWEIGHT  She is been counseled about this in the past.  COVID-19 Education: The signs and symptoms of COVID-19 were discussed with the patient and how to seek care for testing (follow up with PCP or arrange E-visit).  Her daughter still goes out to work.  She has grandson comes back and forth.  The importance of social distancing was discussed today.  ELEVATED A1C I do notice that her AIC in 2018 was 6.8.  We talked about this.  She should get this repeated  in the future.  Time:   Today, I have spent 21 minutes with the patient with telehealth technology discussing the above problems.     Medication Adjustments/Labs and Tests Ordered: Current medicines are reviewed at length with the patient today.  Concerns regarding medicines are outlined above.   Tests Ordered: No orders of the defined types were placed in this encounter.  Medication Changes: No orders of the defined types were placed in this encounter.   Disposition:  Follow up four months.   Signed, Minus Breeding, MD  12/28/2018 9:58 AM    [facility]

## 2018-12-28 ENCOUNTER — Other Ambulatory Visit: Payer: Self-pay

## 2018-12-28 ENCOUNTER — Encounter: Payer: Self-pay | Admitting: Cardiology

## 2018-12-28 ENCOUNTER — Telehealth (INDEPENDENT_AMBULATORY_CARE_PROVIDER_SITE_OTHER): Payer: Medicare HMO | Admitting: Cardiology

## 2018-12-28 VITALS — BP 140/103 | HR 120 | Ht 63.0 in | Wt 185.0 lb

## 2018-12-28 DIAGNOSIS — Z7901 Long term (current) use of anticoagulants: Secondary | ICD-10-CM

## 2018-12-28 DIAGNOSIS — I272 Pulmonary hypertension, unspecified: Secondary | ICD-10-CM

## 2018-12-28 DIAGNOSIS — Z79899 Other long term (current) drug therapy: Secondary | ICD-10-CM | POA: Diagnosis not present

## 2018-12-28 DIAGNOSIS — R7309 Other abnormal glucose: Secondary | ICD-10-CM | POA: Diagnosis not present

## 2018-12-28 DIAGNOSIS — Z87891 Personal history of nicotine dependence: Secondary | ICD-10-CM | POA: Diagnosis not present

## 2018-12-28 DIAGNOSIS — I10 Essential (primary) hypertension: Secondary | ICD-10-CM | POA: Diagnosis not present

## 2018-12-28 DIAGNOSIS — E663 Overweight: Secondary | ICD-10-CM | POA: Diagnosis not present

## 2018-12-28 DIAGNOSIS — I270 Primary pulmonary hypertension: Secondary | ICD-10-CM

## 2018-12-28 DIAGNOSIS — I480 Paroxysmal atrial fibrillation: Secondary | ICD-10-CM

## 2018-12-28 DIAGNOSIS — I422 Other hypertrophic cardiomyopathy: Secondary | ICD-10-CM | POA: Diagnosis not present

## 2018-12-28 NOTE — Patient Instructions (Signed)
Medication Instructions:  Continue current medications  If you need a refill on your cardiac medications before your next appointment, please call your pharmacy.  Labwork: None Ordered .  Testing/Procedures: None Ordered  Follow-Up: You will need a follow up appointment in 4 months.  Please call our office 2 months in advance to schedule this appointment.  You may see James Hochrein, MD or one of the following Advanced Practice Providers on your designated Care Team:   Rhonda Barrett, PA-C . Kathryn Lawrence, DNP, ANP    At [facility], you and your health needs are our priority.  As part of our continuing mission to provide you with exceptional heart care, we have created designated Provider Care Teams.  These Care Teams include your primary Cardiologist (physician) and Advanced Practice Providers (APPs -  Physician Assistants and Nurse Practitioners) who all work together to provide you with the care you need, when you need it.  Thank you for choosing [facility] at [facility]!!     

## 2019-01-04 ENCOUNTER — Other Ambulatory Visit: Payer: Self-pay | Admitting: Cardiology

## 2019-01-04 MED ORDER — ATORVASTATIN CALCIUM 40 MG PO TABS: 40.0000 mg | ORAL_TABLET | Freq: Every day | ORAL | 0 refills | Status: DC

## 2019-01-04 NOTE — Telephone Encounter (Signed)
°*  STAT* If patient is at the pharmacy, call can be transferred to refill team.   1. Which medications need to be refilled? (please list name of each medication and dose if known) atorvastatin (LIPITOR) 40 MG tablet  2. Which pharmacy/location (including street and city if local pharmacy) is medication to be sent to? [id number] - [place], [place] - [place]  3. Do they need a 30 day or 90 day supply? 30 days  Patient has 1 pill left.

## 2019-01-04 NOTE — Telephone Encounter (Signed)
Atorvastatin refilled.  

## 2019-01-04 NOTE — Addendum Note (Signed)
Addended byBarry Brunner on: 01/04/2019 10:02 AM   Modules accepted: Orders

## 2019-01-12 ENCOUNTER — Telehealth: Payer: Self-pay

## 2019-01-12 NOTE — Telephone Encounter (Signed)
I called pt that per JEssica NP her chart was reviewed and she can follow up as needed at our office.Pt stated she is not having any neurological problems or issues. Pt just saw her cardiologist on 12/28/2018 and has seen her PCP since last visit. I stated her appt will be cancel and she can follow up as needed. Pt verbalized understanding.

## 2019-01-18 ENCOUNTER — Ambulatory Visit: Payer: Self-pay | Admitting: Adult Health

## 2019-01-26 ENCOUNTER — Other Ambulatory Visit: Payer: Self-pay

## 2019-01-26 MED ORDER — POTASSIUM CHLORIDE CRYS ER 20 MEQ PO TBCR: 20.0000 meq | EXTENDED_RELEASE_TABLET | Freq: Every day | ORAL | 2 refills | Status: DC

## 2019-02-04 ENCOUNTER — Telehealth: Payer: Self-pay | Admitting: *Deleted

## 2019-02-04 NOTE — Telephone Encounter (Signed)
Calling patient today to discuss upcoming appointment.  We are currently trying to limit exposure to the virus that causes COVID-19 by seeing patients at home rather than in the office. We would like to schedule this appointment as a Virtual Appointment VIA Smartphone or Laptop. Unable to reach patient.  LVMTCB  

## 2019-02-11 ENCOUNTER — Ambulatory Visit: Payer: Medicare HMO | Admitting: Cardiology

## 2019-02-11 NOTE — Progress Notes (Signed)
Electrophysiology TeleHealth Note   Due to national recommendations of social distancing due to COVID 19, an audio/video telehealth visit is felt to be most appropriate for this patient at this time.  See Epic message for the patient's consent to telehealth for [facility].   Date:  02/12/2019   ID:  [name], [place] May 31, 1951, MRN [id number]  Location: patient's home  Provider location: [address] [place]  Evaluation Performed: Follow-up visit  PCP:  [name], L.Bethany Sa, MD  Cardiologist:  Bethany Breeding, MD  Electrophysiologist:  Dr [name]  Chief Complaint:  HCM  History of Present Illness:    [name] is a 68 y.o. female who presents via audio/video conferencing for a telehealth visit today.  Since last being seen in our clinic, the patient reports doing very well.  Today, she denies symptoms of palpitations, chest pain, shortness of breath,  lower extremity edema, dizziness, presyncope, or syncope.  The patient is otherwise without complaint today.  The patient denies symptoms of fevers, chills, cough, or new SOB worrisome for COVID 19.  History of hypertropic cardiomyopathy and atrial fibrillation on Tikosyn. S/p AF ablation 11/12/18.  Today, denies symptoms of palpitations, chest pain, shortness of breath, orthopnea, PND, lower extremity edema, claudication, dizziness, presyncope, syncope, bleeding, or neurologic sequela. The patient is tolerating medications without difficulties.  She is overall feeling well.  She does not think that she has been back in atrial fibrillation since her ablation.  She is continuing to exercise 4-5 times per week.  Past Medical History:  Diagnosis Date  . Atrial fibrillation ([place])   . Atrial flutter ([place])   . Hypertension   . Left ventricular hypertrophy    possibly hypertrophic cardiomyopathy  . Obesity   . Stroke [facility])     Past Surgical History:  Procedure Laterality Date  . ABDOMINAL HYSTERECTOMY    .  ATRIAL FIBRILLATION ABLATION N/A 11/12/2018   Procedure: ATRIAL FIBRILLATION ABLATION;  Surgeon: Bethany Haw, MD;  Location: [place] CV LAB;  Service: Cardiovascular;  Laterality: N/A;  . TEE WITHOUT CARDIOVERSION N/A 11/11/2018   Procedure: TRANSESOPHAGEAL ECHOCARDIOGRAM (TEE);  Surgeon: Bethany Casino, MD;  Location: [facility] ENDOSCOPY;  Service: Cardiovascular;  Laterality: N/A;    Current Outpatient Medications  Medication Sig Dispense Refill  . atorvastatin (LIPITOR) 40 MG tablet Take 1 tablet (40 mg total) by mouth daily at 6 PM. 90 tablet 0  . clotrimazole (ATHLETES FOOT) 1 % cream Apply 1 application topically 2 (two) times daily as needed (skin irritation).    . dofetilide (TIKOSYN) 250 MCG capsule Take 1 capsule (250 mcg total) by mouth 2 (two) times daily. 180 capsule 3  . Famotidine (PEPCID AC PO) Take by mouth.    . hydrocortisone 1 % lotion Apply 1 application topically daily. For dry skin on face.     Marland Kitchen losartan (COZAAR) 100 MG tablet TAKE 1 TABLET BY MOUTH EVERY DAY 90 tablet 3  . magnesium oxide (MAG-OX) 400 MG tablet TAKE 2 TABLETS DAILY BY MOUTH. 180 tablet 1  . metoprolol tartrate (LOPRESSOR) 25 MG tablet Take 1 tablet (25 mg total) by mouth 2 (two) times daily. 180 tablet 3  . ondansetron (ZOFRAN-ODT) 8 MG disintegrating tablet TAKE 1 TABLET BY MOUTH TWICE A DAY AS NEEDED FOR NAUSEA    . potassium chloride Nielsen (KLOR-CON M20) 20 MEQ tablet Take 1 tablet (20 mEq total) by mouth daily. 90 tablet 2  . XARELTO 20 MG TABS tablet TAKE 1  TABLET (20 MG TOTAL) BY MOUTH DAILY WITH SUPPER. (Patient taking differently: Take 20 mg by mouth daily at 6 PM. ) 90 tablet 1   No current facility-administered medications for this visit.     Allergies:   Latex   Social History:  The patient  reports that she has quit smoking. Her smoking use included cigarettes. She has never used smokeless tobacco. She reports that she does not drink alcohol or use drugs.   Family History:  The  patient's  family history includes Diabetes in her daughter; Heart failure in her mother; Stroke in her paternal grandfather.   ROS:  Please see the history of present illness.   All other systems are personally reviewed and negative.    Exam:    Vital Signs:  BP 120/83   Pulse 72   Over the phone, no acute distress, no shortness of breath.  Labs/Other Tests and Data Reviewed:    Recent Labs: 10/21/2018: BUN 20; Creatinine, Ser 0.97; Hemoglobin 12.6; Platelets 238; Potassium 4.4; Sodium 140   Wt Readings from Last 3 Encounters:  12/28/18 185 lb (83.9 kg)  12/10/18 189 lb (85.7 kg)  11/20/18 194 lb (88 kg)     Other studies personally reviewed: Additional studies/ records that were reviewed today include: ECG 12/10/18 personally reviewed  Review of the above records today demonstrates:  Atrial flutter, rate 100  ASSESSMENT & PLAN:    1.  Paroxysmal atrial fibrillation/flutter: On Tikosyn and Xarelto, s/p ablation 11/12/18.  It does appear that she had some sort of atypical atrial flutter previously, but her heart rate is much improved and it is likely that she is back in sinus rhythm.  We [name] continue dofetilide.  No other changes.  This patients CHA2DS2-VASc Score and unadjusted Ischemic Stroke Rate (% per year) is equal to 3.2 % stroke rate/year from a score of 3  Above score calculated as 1 point each if present [CHF, HTN, DM, Vascular=MI/PAD/Aortic Plaque, Age if 65-74, or Female] Above score calculated as 2 points each if present [Age > 75, or Stroke/TIA/TE]  2. Hypertension: Currently well controlled.  3. Hypertrophic cardiomyopathy: No obvious volume overload.  Continue current management.   COVID 19 screen The patient denies symptoms of COVID 19 at this time.  The importance of social distancing was discussed today.  Follow-up: 3 months  Current medicines are reviewed at length with the patient today.   The patient does not have concerns regarding her medicines.   The following changes were made today:  none  Labs/ tests ordered today include:  No orders of the defined types were placed in this encounter.    Patient Risk:  after full review of this patients clinical status, I feel that they are at moderate risk at this time.  Today, I have spent 11 minutes with the patient with telehealth technology discussing atrial fibrillation.    Signed, Alexee Delsanto Meredith Leeds, MD  02/12/2019 9:26 AM     [facility] [address] [place] [address] [phone] (office) [phone] (fax)

## 2019-02-11 NOTE — Telephone Encounter (Signed)

## 2019-02-12 ENCOUNTER — Telehealth (INDEPENDENT_AMBULATORY_CARE_PROVIDER_SITE_OTHER): Payer: Medicare HMO | Admitting: Cardiology

## 2019-02-12 ENCOUNTER — Other Ambulatory Visit: Payer: Self-pay

## 2019-02-12 DIAGNOSIS — I480 Paroxysmal atrial fibrillation: Secondary | ICD-10-CM | POA: Diagnosis not present

## 2019-02-22 ENCOUNTER — Other Ambulatory Visit: Payer: Self-pay | Admitting: Cardiology

## 2019-02-22 NOTE — Telephone Encounter (Signed)
Pt calling requesting a refill on Magnesium 400 mg [facility] to [facility]. Please address

## 2019-02-23 ENCOUNTER — Other Ambulatory Visit: Payer: Self-pay | Admitting: Cardiology

## 2019-02-23 MED ORDER — MAGNESIUM OXIDE 400 MG PO TABS: ORAL_TABLET | ORAL | 3 refills | Status: DC

## 2019-02-23 NOTE — Telephone Encounter (Signed)
68yo Female Wt = 83.9kg Scr = 0.97  estCrCl = 59ml/min  Last OV - telemedicine Dr Curt Bears on 02/12/2019

## 2019-03-02 DIAGNOSIS — E669 Obesity, unspecified: Secondary | ICD-10-CM | POA: Diagnosis not present

## 2019-03-02 DIAGNOSIS — Z8673 Personal history of transient ischemic attack (TIA), and cerebral infarction without residual deficits: Secondary | ICD-10-CM | POA: Diagnosis not present

## 2019-03-02 DIAGNOSIS — I4891 Unspecified atrial fibrillation: Secondary | ICD-10-CM | POA: Diagnosis not present

## 2019-03-02 DIAGNOSIS — Z1211 Encounter for screening for malignant neoplasm of colon: Secondary | ICD-10-CM | POA: Diagnosis not present

## 2019-03-02 DIAGNOSIS — I10 Essential (primary) hypertension: Secondary | ICD-10-CM | POA: Diagnosis not present

## 2019-03-02 DIAGNOSIS — Z0000 Encounter for general adult medical examination without abnormal findings: Secondary | ICD-10-CM | POA: Diagnosis not present

## 2019-03-11 ENCOUNTER — Other Ambulatory Visit: Payer: Self-pay | Admitting: Family Medicine

## 2019-03-11 DIAGNOSIS — Z1231 Encounter for screening mammogram for malignant neoplasm of breast: Secondary | ICD-10-CM

## 2019-03-29 ENCOUNTER — Other Ambulatory Visit: Payer: Self-pay | Admitting: Cardiology

## 2019-03-29 MED ORDER — ATORVASTATIN CALCIUM 40 MG PO TABS: 40.0000 mg | ORAL_TABLET | Freq: Every day | ORAL | 0 refills | Status: DC

## 2019-03-29 MED ORDER — RIVAROXABAN 20 MG PO TABS: ORAL_TABLET | ORAL | 0 refills | Status: DC

## 2019-03-29 NOTE — Telephone Encounter (Signed)
 *  STAT* If patient is at the pharmacy, call can be transferred to refill team.   1. Which medications need to be refilled? (please list name of each medication and dose if known)   atorvastatin (LIPITOR) 40 MG tablet XARELTO 20 MG TABS tablet  2. Which pharmacy/location (including street and city if local pharmacy) is medication to be sent to? [facility]   3. Do they need a 30 day or 90 day supply? 90 days

## 2019-03-29 NOTE — Telephone Encounter (Signed)
Lipitor 40 mg daily and xarelto 20 mg daily refilled.

## 2019-04-27 ENCOUNTER — Other Ambulatory Visit: Payer: Self-pay

## 2019-04-27 ENCOUNTER — Ambulatory Visit
Admission: RE | Admit: 2019-04-27 | Discharge: 2019-04-27 | Disposition: A | Discharge status: Home / Self Care | Payer: Medicare HMO | Source: Ambulatory Visit | Attending: Family Medicine | Admitting: Family Medicine

## 2019-04-27 DIAGNOSIS — Z1231 Encounter for screening mammogram for malignant neoplasm of breast: Secondary | ICD-10-CM

## 2019-05-12 ENCOUNTER — Telehealth: Payer: Self-pay | Admitting: Cardiology

## 2019-05-12 NOTE — Telephone Encounter (Signed)
   [facility] Pre-operative Risk Assessment    Request for surgical clearance:  1. What type of surgery is being performed? Colonoscopy   2. When is this surgery scheduled? TBD   3. What type of clearance is required (medical clearance vs. Pharmacy clearance to hold med vs. Both)? Both  4. Are there any medications that need to be held prior to surgery and how long? Xarelto; length of time not specified   5. Practice name and name of physician performing surgery? [facility]; Dr. Alessandra Bevels   6. What is your office phone number [phone]    7.   What is your office fax number [phone]  8.   Anesthesia type (None, local, MAC, general) ? Propofol   [name] [name] 05/12/2019, 4:22 PM  _________________________________________________________________   (provider comments below)

## 2019-05-12 NOTE — Telephone Encounter (Signed)
Please comment on holding xarelto. 

## 2019-05-13 NOTE — Telephone Encounter (Signed)
Pt takes Xarelto for afib with CHADS2VASc score of 5 (age, sex, HTN, stroke). She underwent afib ablation 11/12/18. Renal function is normal. Recommend only holding Xarelto for 1 day prior to colonoscopy due to hx of stroke.

## 2019-05-13 NOTE — Telephone Encounter (Signed)
   Primary Cardiologist: Minus Breeding, MD  Chart reviewed as part of pre-operative protocol coverage. Patient was contacted 05/13/2019 in reference to pre-operative risk assessment for pending surgery as outlined below.  [name] was last seen via virtual visit byDr. Curt Bears.  Since that day, [name] has done well without occasional transient palpitation, but no chest pain or shortness of breath.  Therefore, based on ACC/AHA guidelines, the patient would be at acceptable risk for the planned procedure without further cardiovascular testing.   I will route this recommendation to the requesting party via Epic fax function and remove from pre-op pool.  Please call with questions. She has been instructed to hold Xarelto for 1 day prior to the procedure given h/o stroke and restart as soon as possible afterward.   [place], [place] 05/13/2019, 10:15 AM

## 2019-05-18 ENCOUNTER — Other Ambulatory Visit: Payer: Self-pay

## 2019-05-18 ENCOUNTER — Encounter: Payer: Self-pay | Admitting: Cardiology

## 2019-05-18 ENCOUNTER — Ambulatory Visit (INDEPENDENT_AMBULATORY_CARE_PROVIDER_SITE_OTHER): Payer: Medicare HMO | Admitting: Cardiology

## 2019-05-18 VITALS — BP 160/102 | HR 104 | Ht 63.0 in | Wt 179.4 lb

## 2019-05-18 DIAGNOSIS — Z79899 Other long term (current) drug therapy: Secondary | ICD-10-CM | POA: Diagnosis not present

## 2019-05-18 DIAGNOSIS — I480 Paroxysmal atrial fibrillation: Secondary | ICD-10-CM

## 2019-05-18 LAB — BASIC METABOLIC PANEL
BUN/Creatinine Ratio: 14 (ref 12–28)
BUN: 15 mg/dL (ref 8–27)
CO2: 23 mmol/L (ref 20–29)
Calcium: 9.9 mg/dL (ref 8.7–10.3)
Chloride: 100 mmol/L (ref 96–106)
Creatinine, Ser: 1.05 mg/dL — ABNORMAL HIGH (ref 0.57–1.00)
GFR calc Af Amer: 64 mL/min/{1.73_m2} (ref >59)
GFR calc non Af Amer: 55 mL/min/{1.73_m2} — ABNORMAL LOW (ref >59)
Glucose: 135 mg/dL — ABNORMAL HIGH (ref 65–99)
Potassium: 4.2 mmol/L (ref 3.5–5.2)
Sodium: 136 mmol/L (ref 134–144)

## 2019-05-18 LAB — MAGNESIUM: Magnesium: 2 mg/dL (ref 1.6–2.3)

## 2019-05-18 NOTE — Progress Notes (Signed)
Electrophysiology Office Note   Date:  05/18/2019   ID:  [place], [place] Jun 24, 1951, MRN [id number]  PCP:  [name], Bethany Jews.Bethany Sa, MD  Cardiologist:  [name] Primary Electrophysiologist:  Bethany Tewell Meredith Leeds, MD    No chief complaint on file.    History of Present Illness: [name] is a 68 y.o. female who is being seen today for the evaluation of atrial fibrillation/flutter at the request of [name], L.Bethany Sa, MD. Presenting today for electrophysiology evaluation.  She has a history significant for atrial fibrillation and hypertrophic cardiomyopathy.  She is currently on dofetilide.  Today, denies symptoms of palpitations, chest pain, shortness of breath, orthopnea, PND, lower extremity edema, claudication, dizziness, presyncope, syncope, bleeding, or neurologic sequela. The patient is tolerating medications without difficulties.  Overall she is doing well.  She has no chest pain or shortness of breath.  She do all her daily activities without restriction.  She continues to exercise outside.   Past Medical History:  Diagnosis Date  . Atrial fibrillation ([name])   . Atrial flutter ([name])   . Hypertension   . Left ventricular hypertrophy    possibly hypertrophic cardiomyopathy  . Obesity   . Stroke [facility])    Past Surgical History:  Procedure Laterality Date  . ABDOMINAL HYSTERECTOMY    . ATRIAL FIBRILLATION ABLATION N/A 11/12/2018   Procedure: ATRIAL FIBRILLATION ABLATION;  Surgeon: Bethany Haw, MD;  Location: [place] CV LAB;  Service: Cardiovascular;  Laterality: N/A;  . TEE WITHOUT CARDIOVERSION N/A 11/11/2018   Procedure: TRANSESOPHAGEAL ECHOCARDIOGRAM (TEE);  Surgeon: Bethany Casino, MD;  [facility] ENDOSCOPY;  Service: Cardiovascular;  Laterality: N/A;     Current Outpatient Medications  Medication Sig Dispense Refill  . atorvastatin (LIPITOR) 40 MG tablet Take 1 tablet (40 mg total) by mouth daily at 6 PM. 90 tablet 0  . clotrimazole  (ATHLETES FOOT) 1 % cream Apply 1 application topically 2 (two) times daily as needed (skin irritation).    . dofetilide (TIKOSYN) 250 MCG capsule Take 1 capsule (250 mcg total) by mouth 2 (two) times daily. 180 capsule 3  . Famotidine (PEPCID AC PO) Take by mouth.    . hydrocortisone 1 % lotion Apply 1 application topically daily. For dry skin on face.     Marland Kitchen losartan (COZAAR) 100 MG tablet TAKE 1 TABLET BY MOUTH EVERY DAY 90 tablet 3  . magnesium oxide (MAG-OX) 400 MG tablet TAKE 2 TABLETS DAILY BY MOUTH. 180 tablet 3  . metoprolol tartrate (LOPRESSOR) 25 MG tablet Take 1 tablet (25 mg total) by mouth 2 (two) times daily. 180 tablet 3  . ondansetron (ZOFRAN-ODT) 8 MG disintegrating tablet TAKE 1 TABLET BY MOUTH TWICE A DAY AS NEEDED FOR NAUSEA    . potassium chloride Nielsen (KLOR-CON M20) 20 MEQ tablet Take 1 tablet (20 mEq total) by mouth daily. 90 tablet 2  . rivaroxaban (XARELTO) 20 MG TABS tablet TAKE 1 TABLET (20 MG TOTAL) BY MOUTH DAILY WITH SUPPER. 90 tablet 0   No current facility-administered medications for this visit.     Allergies:   Latex   Social History:  The patient  reports that she has quit smoking. Her smoking use included cigarettes. She has never used smokeless tobacco. She reports that she does not drink alcohol or use drugs.   Family History:  The patient's family history includes Diabetes in her daughter; Heart failure in her mother; Stroke in her paternal grandfather.    ROS:  Please see the history  of present illness.   Otherwise, review of systems is positive for none.   All other systems are reviewed and negative.   PHYSICAL EXAM: VS:  BP (!) 160/102   Pulse (!) 104   Ht 5\' 3"  (1.6 m)   Wt 179 lb 6.4 oz (81.4 kg)   SpO2 97%   BMI 31.78 kg/m  , BMI Body mass index is 31.78 kg/m. GEN: Well nourished, well developed, in no acute distress  HEENT: normal  Neck: no JVD, carotid bruits, or masses Cardiac: RRR; no murmurs, rubs, or gallops,no edema  Respiratory:   clear to auscultation bilaterally, normal work of breathing GI: soft, nontender, nondistended, + BS MS: no deformity or atrophy  Skin: warm and dry Neuro:  Strength and sensation are intact Psych: euthymic mood, full affect  EKG:  EKG is ordered today. Personal review of the ekg ordered shows sinus rhythm, rate 104, QTc 483, inferolateral TWI    Recent Labs: 10/21/2018: BUN 20; Creatinine, Ser 0.97; Hemoglobin 12.6; Platelets 238; Potassium 4.4; Sodium 140    Lipid Panel     Component Value Date/Time   CHOL 136 09/11/2017 0345   TRIG 53 09/11/2017 0345   HDL 37 (L) 09/11/2017 0345   CHOLHDL 3.7 09/11/2017 0345   VLDL 11 09/11/2017 0345   LDLCALC 88 09/11/2017 0345     Wt Readings from Last 3 Encounters:  05/18/19 179 lb 6.4 oz (81.4 kg)  12/28/18 185 lb (83.9 kg)  12/10/18 189 lb (85.7 kg)      Other studies Reviewed: Additional studies/ records that were reviewed today include: TTE 03/03/18  Review of the above records today demonstrates:  - Left ventricle: The cavity size was normal. Wall thickness was   increased in a pattern of moderate LVH. Systolic function was   normal. The estimated ejection fraction was in the range of 60%   to 65%. Wall motion was normal; there were no regional wall   motion abnormalities. Doppler parameters are consistent with a   reversible restrictive pattern, indicative of decreased left   ventricular diastolic compliance and/or increased left atrial   pressure (grade 3 diastolic dysfunction). - Aortic valve: There was trivial regurgitation. - Left atrium: The atrium was severely dilated. - Right atrium: The atrium was moderately dilated. - Pulmonary arteries: Systolic pressure was moderately increased.   PA peak pressure: 45 mm Hg (S).  Holter 10/06/18 - personally reviewed NSR PAF and atrial flutter Occasional atrial ectopy   ASSESSMENT AND PLAN:  1.  Paroxysmal atrial fibrillation/atrial flutter: Currently on dofetilide and  Xarelto.  Is status post AF ablation 11/12/2018.  She is remained in sinus rhythm since ablation.  We [name] check dofetilide labs today.  This patients CHA2DS2-VASc Score and unadjusted Ischemic Stroke Rate (% per year) is equal to 3.2 % stroke rate/year from a score of 3  Above score calculated as 1 point each if present [CHF, HTN, DM, Vascular=MI/PAD/Aortic Plaque, Age if 65-74, or Female] Above score calculated as 2 points each if present [Age > 75, or Stroke/TIA/TE]   2.  Hypertension: Blood pressure is elevated today.  She does bring in blood pressure recordings from home that show her blood pressures are consistently lower than [id number] systolic with heart rates in the 60s to 70s.  No changes at this time.  3.  Hypertrophic cardiomyopathy: TTE shows moderate LVH with restrictive pattern of grade 3 diastolic dysfunction.  No obvious volume overload.    Current medicines are reviewed at length with  the patient today.   The patient does not have concerns regarding her medicines.  The following changes were made today: None  Labs/ tests ordered today include:  No orders of the defined types were placed in this encounter.   Disposition:   FU with Tommie Bohlken 6 months  Signed, Kandi Brusseau Meredith Leeds, MD  05/18/2019 9:45 AM     [facility] [address] [place] [address] [phone] (office) [phone] (fax)

## 2019-05-18 NOTE — Patient Instructions (Addendum)
Medication Instructions:  Your physician recommends that you continue on your current medications as directed. Please refer to the Current Medication list given to you today.  * If you need a refill on your cardiac medications before your next appointment, please call your pharmacy.   Labwork: Tikosyn surveillance labs today: BMET & Magnesium level *We will only notify you of abnormal results, otherwise continue current treatment plan.  Testing/Procedures: None ordered  Follow-Up: Your physician wants you to follow-up in: 6 months with Dr. Curt Bears.  You will receive a reminder letter in the mail two months in advance. If you don't receive a letter, please call our office to schedule the follow-up appointment.  Thank you for choosing [facility]!!   Trinidad Curet, RN [phone]

## 2019-05-18 NOTE — Progress Notes (Signed)
Cardiology Office Note   Date:  05/20/2019   ID:  [place], [place] 01-05-1951, MRN [id number]  PCP:  [name], Carlean Jews.Marlou Sa, MD  Cardiologist:   Minus Breeding, MD  Chief Complaint  Patient presents with  . Atrial Fibrillation      History of Present Illness: [name] is a 68 y.o. female who presents for followup of atrial fibrillation and hypertrophic cardiomyopathy. She has been followed in the atrial fib clinic.  She had ablation of her atrial fib in February.   Prior to this she had a CT and a TEE.    Since I last saw her she has done well.  She has had some very rare palpitations but they last for only a second.  She denies any chest pressure, neck or arm discomfort.  She has no shortness of breath, PND or orthopnea.  He has had intentional weight loss.  She has been exercising.    Past Medical History:  Diagnosis Date  . Atrial fibrillation ([place])   . Atrial flutter ([place])   . Hypertension   . Left ventricular hypertrophy    possibly hypertrophic cardiomyopathy  . Obesity   . Stroke [facility])     Past Surgical History:  Procedure Laterality Date  . ABDOMINAL HYSTERECTOMY    . ATRIAL FIBRILLATION ABLATION N/A 11/12/2018   Procedure: ATRIAL FIBRILLATION ABLATION;  Surgeon: Constance Haw, MD;  Location: [place] CV LAB;  Service: Cardiovascular;  Laterality: N/A;  . TEE WITHOUT CARDIOVERSION N/A 11/11/2018   Procedure: TRANSESOPHAGEAL ECHOCARDIOGRAM (TEE);  Surgeon: Pixie Casino, MD;  Location: [facility] ENDOSCOPY;  Service: Cardiovascular;  Laterality: N/A;     Current Outpatient Medications  Medication Sig Dispense Refill  . atorvastatin (LIPITOR) 40 MG tablet Take 1 tablet (40 mg total) by mouth daily at 6 PM. 90 tablet 0  . clotrimazole (ATHLETES FOOT) 1 % cream Apply 1 application topically 2 (two) times daily as needed (skin irritation).    . dofetilide (TIKOSYN) 250 MCG capsule Take 1 capsule (250 mcg total) by mouth 2 (two) times  daily. 180 capsule 3  . Famotidine (PEPCID AC PO) Take by mouth.    . hydrocortisone 1 % lotion Apply 1 application topically daily. For dry skin on face.     Marland Kitchen losartan (COZAAR) 100 MG tablet TAKE 1 TABLET BY MOUTH EVERY DAY 90 tablet 3  . magnesium oxide (MAG-OX) 400 MG tablet TAKE 2 TABLETS DAILY BY MOUTH. 180 tablet 3  . metoprolol tartrate (LOPRESSOR) 25 MG tablet Take 1 tablet (25 mg total) by mouth 2 (two) times daily. 180 tablet 3  . ondansetron (ZOFRAN-ODT) 8 MG disintegrating tablet TAKE 1 TABLET BY MOUTH TWICE A DAY AS NEEDED FOR NAUSEA    . potassium chloride SA (KLOR-CON M20) 20 MEQ tablet Take 1 tablet (20 mEq total) by mouth daily. 90 tablet 2  . rivaroxaban (XARELTO) 20 MG TABS tablet TAKE 1 TABLET (20 MG TOTAL) BY MOUTH DAILY WITH SUPPER. 90 tablet 0   No current facility-administered medications for this visit.     Allergies:   Latex    ROS:  Please see the history of present illness.   Otherwise, review of systems are positive for none.   All other systems are reviewed and negative.    PHYSICAL EXAM: VS:  BP (!) 148/94   Pulse (!) 103   Temp (!) 97.3 F (36.3 C)   Ht 5\' 3"  (1.6 m)   Wt 179 lb (81.2 kg)  BMI 31.71 kg/m  , BMI Body mass index is 31.71 kg/m. GENERAL:  Well appearing NECK:  No jugular venous distention, waveform within normal limits, carotid upstroke brisk and symmetric, no bruits, no thyromegaly LUNGS:  Clear to auscultation bilaterally CHEST:  Unremarkable HEART:  PMI not displaced or sustained,S1 and S2 within normal limits, no S3, no S4, no clicks, no rubs, no murmurs ABD:  Flat, positive bowel sounds normal in frequency in pitch, no bruits, no rebound, no guarding, no midline pulsatile mass, no hepatomegaly, no splenomegaly EXT:  2 plus pulses throughout, no edema, no cyanosis no clubbing   EKG:  EKG is not ordered today.    Recent Labs: 10/21/2018: Hemoglobin 12.6; Platelets 238 05/18/2019: BUN 15; Creatinine, Ser 1.05; Magnesium 2.0;  Potassium 4.2; Sodium 136    Lipid Panel    Component Value Date/Time   CHOL 136 09/11/2017 0345   TRIG 53 09/11/2017 0345   HDL 37 (L) 09/11/2017 0345   CHOLHDL 3.7 09/11/2017 0345   VLDL 11 09/11/2017 0345   LDLCALC 88 09/11/2017 0345      Wt Readings from Last 3 Encounters:  05/20/19 179 lb (81.2 kg)  05/18/19 179 lb 6.4 oz (81.4 kg)  12/28/18 185 lb (83.9 kg)      Other studies Reviewed: Additional studies/ records that were reviewed today include: TEE/CT. Review of the above records demonstrates:  Please see elsewhere in the note.     ASSESSMENT AND PLAN:  Atrial fibrillation -    [name] a CHA2DS2 - VASc score of 2 with a risk of stroke of 2%.   She tolerates anticoagulation.  She has had very few palpitations.  No change in therapy.  TIA - She has had no new symptoms.  No further imaging is chemotherapy.  HYPERTENSION, BENIGN -  Her blood pressure is well controlled.  I reviewed the blood pressure diary.  Slightly elevated today but this is unusual.  No change in therapy.  HYPERTROPHIC CARDIOMYOPATHY She has mild LVH.  No change in therapy.   PULMONARY HTN This was not mentioned on the TEE done in Feb. I will follow this clinically.  OVERWEIGHT Compatible weight loss.  No change in therapy.    Current medicines are reviewed at length with the patient today.  The patient does not have concerns regarding medicines.  The following changes have been made:  no change  Labs/ tests ordered today include: None No orders of the defined types were placed in this encounter.    Disposition:   FU with me in 12 months.     Signed, Minus Breeding, MD  05/20/2019 8:53 AM    [facility]

## 2019-05-20 ENCOUNTER — Other Ambulatory Visit: Payer: Self-pay

## 2019-05-20 ENCOUNTER — Ambulatory Visit: Payer: Medicare HMO | Admitting: Cardiology

## 2019-05-20 ENCOUNTER — Encounter: Payer: Self-pay | Admitting: Cardiology

## 2019-05-20 VITALS — BP 148/94 | HR 103 | Temp 97.3°F | Ht 63.0 in | Wt 179.0 lb

## 2019-05-20 DIAGNOSIS — I4891 Unspecified atrial fibrillation: Secondary | ICD-10-CM | POA: Diagnosis not present

## 2019-05-20 DIAGNOSIS — I10 Essential (primary) hypertension: Secondary | ICD-10-CM | POA: Diagnosis not present

## 2019-05-20 DIAGNOSIS — I421 Obstructive hypertrophic cardiomyopathy: Secondary | ICD-10-CM

## 2019-05-20 NOTE — Patient Instructions (Signed)
Medication Instructions:  Your physician recommends that you continue on your current medications as directed. Please refer to the Current Medication list given to you today.  If you need a refill on your cardiac medications before your next appointment, please call your pharmacy.   Lab work: NONE If you have labs (blood work) drawn today and your tests are completely normal, you will receive your results only by: [place] (if you have MyChart) OR A paper copy in the mail If you have any lab test that is abnormal or we need to change your treatment, we will call you to review the results.  Testing/Procedures: NONE  Follow-Up: At [facility], you and your health needs are our priority.  As part of our continuing mission to provide you with exceptional heart care, we have created designated Provider Care Teams.  These Care Teams include your primary Cardiologist (physician) and Advanced Practice Providers (APPs -  Physician Assistants and Nurse Practitioners) who all work together to provide you with the care you need, when you need it. You will need a follow up appointment in 12 months.  Please call our office 2 months in advance to schedule this appointment.  You may see Minus Breeding, MD or one of the following Advanced Practice Providers on your designated Care Team:   Rosaria Ferries, PA-C Jory Sims, DNP, ANP

## 2019-06-10 DIAGNOSIS — Z1159 Encounter for screening for other viral diseases: Secondary | ICD-10-CM | POA: Diagnosis not present

## 2019-06-15 DIAGNOSIS — K635 Polyp of colon: Secondary | ICD-10-CM | POA: Diagnosis not present

## 2019-06-15 DIAGNOSIS — Z1211 Encounter for screening for malignant neoplasm of colon: Secondary | ICD-10-CM | POA: Diagnosis not present

## 2019-06-15 DIAGNOSIS — D120 Benign neoplasm of cecum: Secondary | ICD-10-CM | POA: Diagnosis not present

## 2019-06-21 DIAGNOSIS — D120 Benign neoplasm of cecum: Secondary | ICD-10-CM | POA: Diagnosis not present

## 2019-06-21 DIAGNOSIS — K635 Polyp of colon: Secondary | ICD-10-CM | POA: Diagnosis not present

## 2019-06-23 ENCOUNTER — Other Ambulatory Visit: Payer: Self-pay

## 2019-06-23 MED ORDER — ATORVASTATIN CALCIUM 40 MG PO TABS: 40.0000 mg | ORAL_TABLET | Freq: Every day | ORAL | 3 refills | Status: DC

## 2019-06-23 NOTE — Telephone Encounter (Signed)
Patient is requesting refill on Atorvastatin 40MG , saw Dr. Percival Spanish in September 2020.

## 2019-07-10 DIAGNOSIS — Z23 Encounter for immunization: Secondary | ICD-10-CM | POA: Diagnosis not present

## 2019-08-18 ENCOUNTER — Other Ambulatory Visit: Payer: Self-pay | Admitting: Cardiology

## 2019-08-18 MED ORDER — RIVAROXABAN 20 MG PO TABS: ORAL_TABLET | ORAL | 0 refills | Status: DC

## 2019-08-18 MED ORDER — POTASSIUM CHLORIDE CRYS ER 20 MEQ PO TBCR: 20.0000 meq | EXTENDED_RELEASE_TABLET | Freq: Every day | ORAL | 2 refills | Status: DC

## 2019-08-18 MED ORDER — LOSARTAN POTASSIUM 100 MG PO TABS: 100.0000 mg | ORAL_TABLET | Freq: Every day | ORAL | 2 refills | Status: DC

## 2019-08-18 MED ORDER — DOFETILIDE 250 MCG PO CAPS: 250.0000 ug | ORAL_CAPSULE | Freq: Two times a day (BID) | ORAL | 2 refills | Status: DC

## 2019-08-18 MED ORDER — METOPROLOL TARTRATE 25 MG PO TABS: 25.0000 mg | ORAL_TABLET | Freq: Two times a day (BID) | ORAL | 2 refills | Status: DC

## 2019-08-18 NOTE — Telephone Encounter (Signed)
Xarelto refill request  Requested Prescriptions   Signed Prescriptions Disp Refills  . losartan (COZAAR) 100 MG tablet 90 tablet 2    Sig: Take 1 tablet (100 mg total) by mouth daily.    Authorizing Provider: Minus Breeding    Ordering User: NEWCOMER MCCLAIN, BRANDY L  . metoprolol tartrate (LOPRESSOR) 25 MG tablet 180 tablet 2    Sig: Take 1 tablet (25 mg total) by mouth 2 (two) times daily.    Authorizing Provider: Minus Breeding    Ordering User: NEWCOMER MCCLAIN, BRANDY L  . dofetilide (TIKOSYN) 250 MCG capsule 180 capsule 2    Sig: Take 1 capsule (250 mcg total) by mouth 2 (two) times daily.    Authorizing Provider: Minus Breeding    Ordering User: NEWCOMER MCCLAIN, BRANDY L  . potassium chloride SA (KLOR-CON M20) 20 MEQ tablet 90 tablet 2    Sig: Take 1 tablet (20 mEq total) by mouth daily.    Authorizing Provider: Minus Breeding    Ordering User: Raelene Bott, BRANDY L

## 2019-08-18 NOTE — Telephone Encounter (Signed)
New Message      *STAT* If patient is at the pharmacy, call can be transferred to refill team.   1. Which medications need to be refilled? (please list name of each medication and dose if known) rivaroxaban (XARELTO) 20 MG TABS tablet      losartan (COZAAR) 100 MG tablet    dofetilide (TIKOSYN) 250 MCG capsule metoprolol tartrate (LOPRESSOR) 25 MG tablet     potassium chloride SA (KLOR-CON M20) 20 MEQ tablet  2. Which pharmacy/location (including street and city if local pharmacy) is medication to be sent to? [id number] [place] [place] [facility] [place]   3. Do they need a 30 day or 90 day supply? [place]

## 2019-08-18 NOTE — Telephone Encounter (Signed)
This is Dr. Hochrein's pt. °

## 2019-09-02 DIAGNOSIS — I10 Essential (primary) hypertension: Secondary | ICD-10-CM | POA: Diagnosis not present

## 2019-09-02 DIAGNOSIS — Z8673 Personal history of transient ischemic attack (TIA), and cerebral infarction without residual deficits: Secondary | ICD-10-CM | POA: Diagnosis not present

## 2019-09-02 DIAGNOSIS — R7309 Other abnormal glucose: Secondary | ICD-10-CM | POA: Diagnosis not present

## 2019-09-02 DIAGNOSIS — I4891 Unspecified atrial fibrillation: Secondary | ICD-10-CM | POA: Diagnosis not present

## 2019-09-02 DIAGNOSIS — E669 Obesity, unspecified: Secondary | ICD-10-CM | POA: Diagnosis not present

## 2019-09-03 ENCOUNTER — Other Ambulatory Visit: Payer: Self-pay | Admitting: Cardiology

## 2019-09-03 NOTE — Telephone Encounter (Signed)
Contacted pharmacy to see if previous rx that was sent on 08/18/2019 was received and rx was transferred to the [address] store.   Spoke with patient and she stated she received a text stating she needed to contact her Dr office for additional refills. She said someone was trying to make things easier on her by [facility] [address], but she can only get her Tikosyn at the [place] location be the other location does not carry it.   She thanked me for my can and I informed her that the pharmacist stated that the Tikosyn rx is ready to be picked up today.   She voiced understanding.

## 2019-09-03 NOTE — Telephone Encounter (Signed)
New Message   *STAT* If patient is at the pharmacy, call can be transferred to refill team.   1. Which medications need to be refilled? (please list name of each medication and dose if known) dofetilide (TIKOSYN) 250 MCG capsule  2. Which pharmacy/location (including street and city if local pharmacy) is medication to be sent to? [id number] [place], [place] - [place].  3. Do they need a 30 day or 90 day supply? 90 day supply

## 2019-09-08 DIAGNOSIS — R7309 Other abnormal glucose: Secondary | ICD-10-CM | POA: Diagnosis not present

## 2019-09-08 DIAGNOSIS — Z23 Encounter for immunization: Secondary | ICD-10-CM | POA: Diagnosis not present

## 2019-09-08 DIAGNOSIS — Z8673 Personal history of transient ischemic attack (TIA), and cerebral infarction without residual deficits: Secondary | ICD-10-CM | POA: Diagnosis not present

## 2019-09-08 DIAGNOSIS — I10 Essential (primary) hypertension: Secondary | ICD-10-CM | POA: Diagnosis not present

## 2019-11-04 IMAGING — CT CT HEAD W/O CM
4 series · 17 of 47 positions shown, 19 images · non-contrast
Comparison: None.

CLINICAL DATA: Slurred speech and right-sided facial droop.

EXAM:
CT HEAD WITHOUT CONTRAST
TECHNIQUE: Contiguous axial images were obtained from the base of the skull
through the vertex without intravenous contrast.

[Series 3: head wo · axial · 0.42mm/px · z∈[-136,-10]mm · 7 of 35 slices shown, 9 images]
[im 5/35  brain]
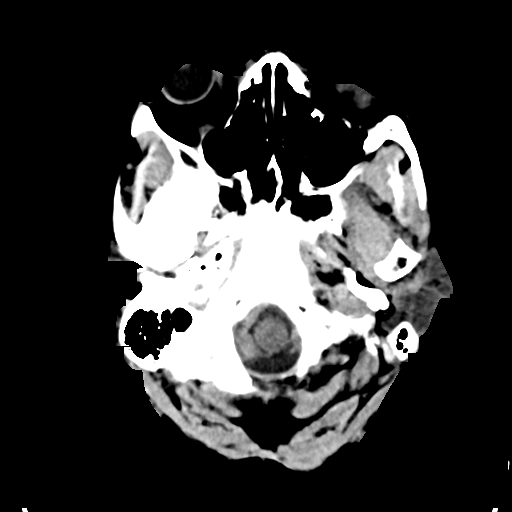
[im 5/35  bone]
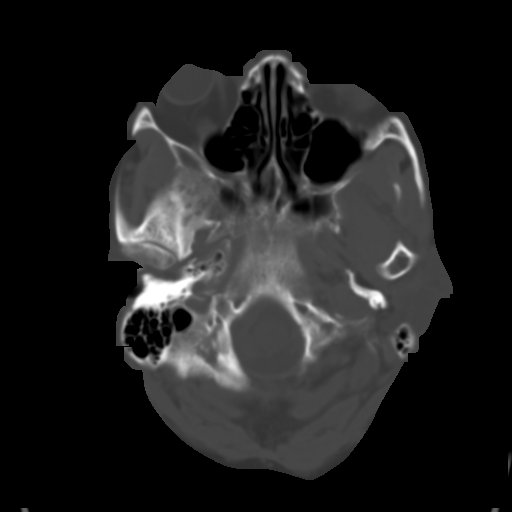
[im 9/35  brain]
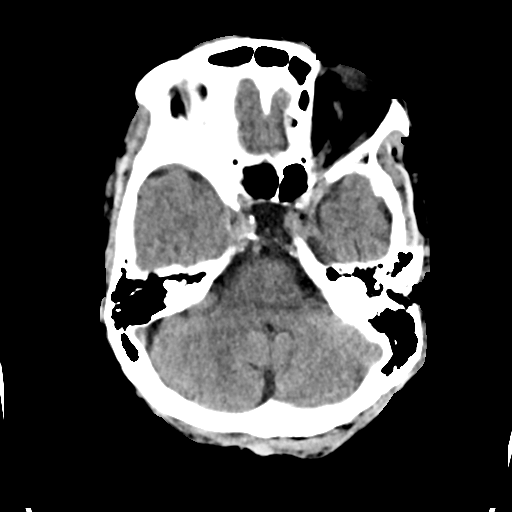
[im 13/35  brain]
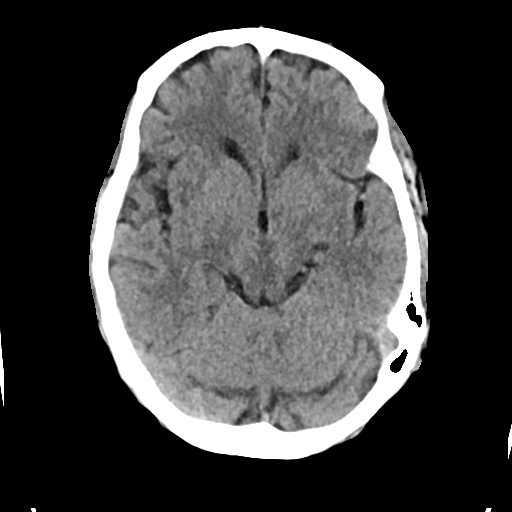
[im 18/35  brain]
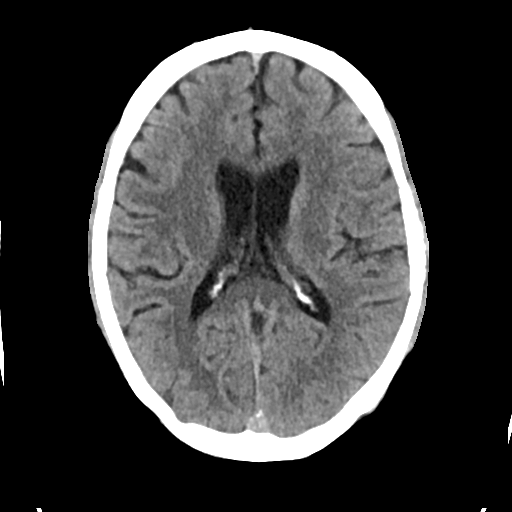
[im 22/35  brain]
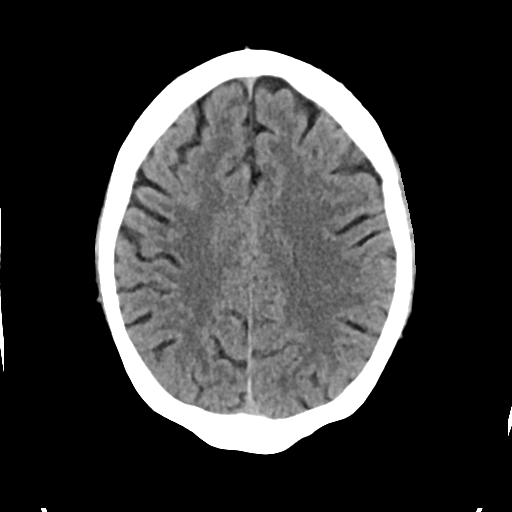
[im 22/35  bone]
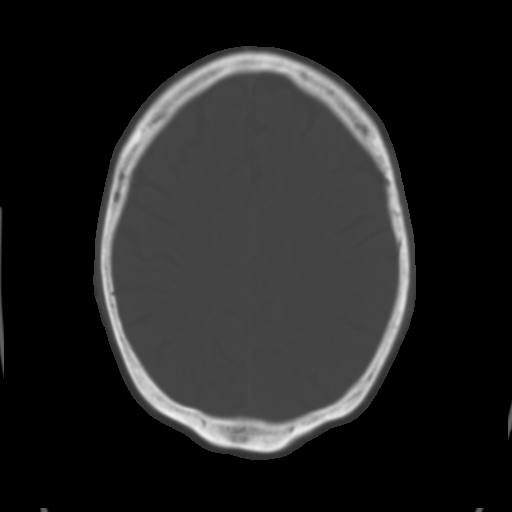
[im 26/35  brain]
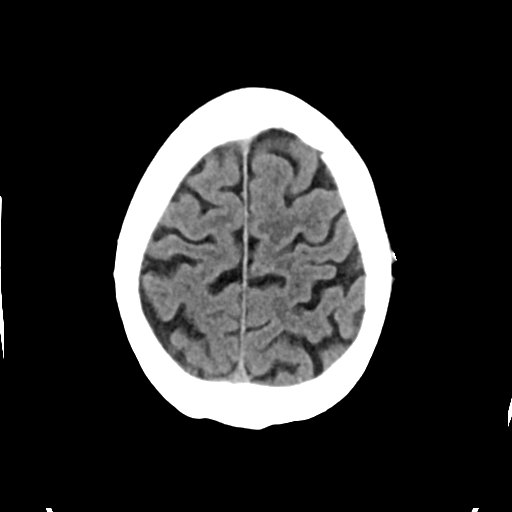
[im 30/35  brain]
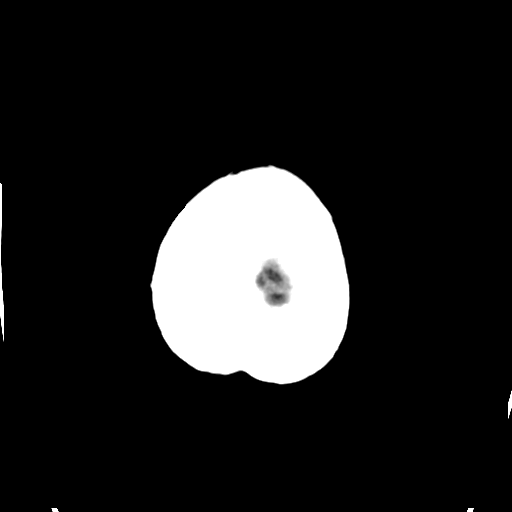

[Series 4: head bone · axial · 0.42mm/px · z∈[-140,-78]mm · 4 of 88 slices shown]
[im 9/88  bone]
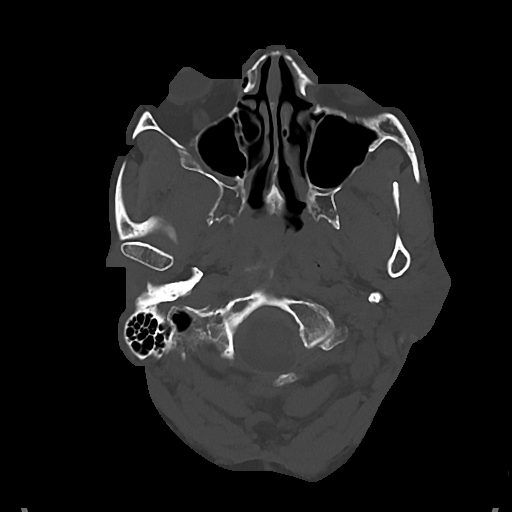
[im 18/88  bone]
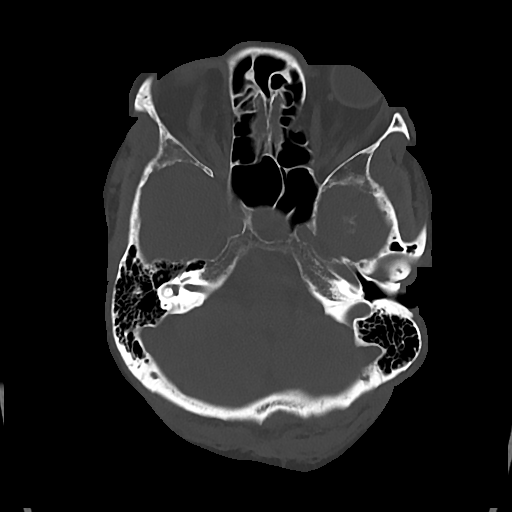
[im 27/88  bone]
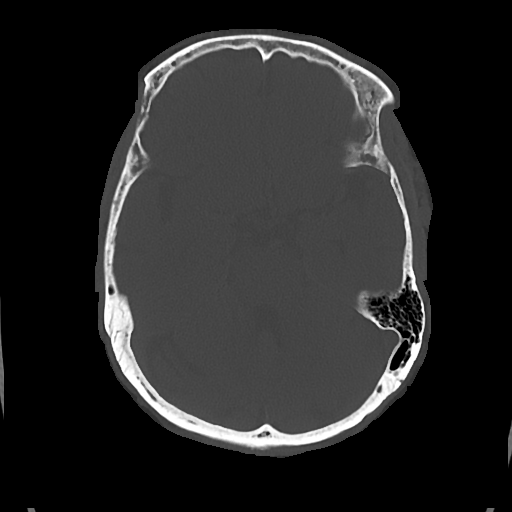
[im 40/88  bone]
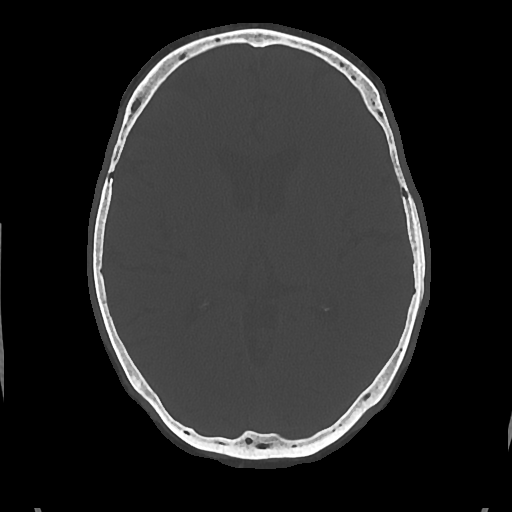

[Series 5: cor soft · coronal · 0.34mm/px · 3 of 74 slices shown]
[im 25/74  brain]
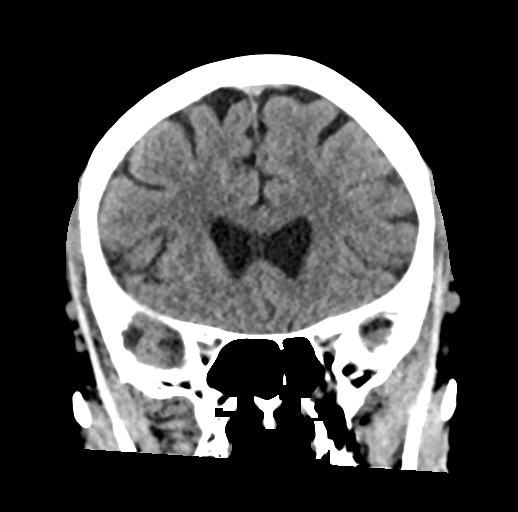
[im 33/74  brain]
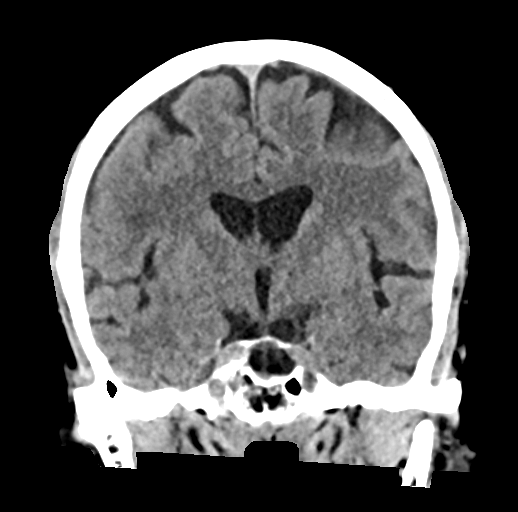
[im 41/74  brain]
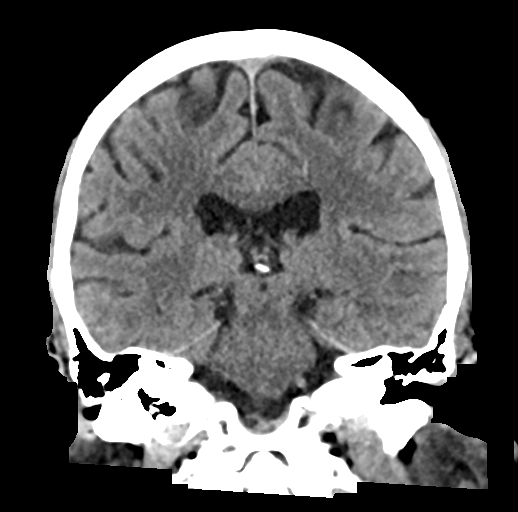

[Series 6: sag soft · sagittal · 0.34mm/px · 3 of 60 slices shown]
[im 20/60  brain]
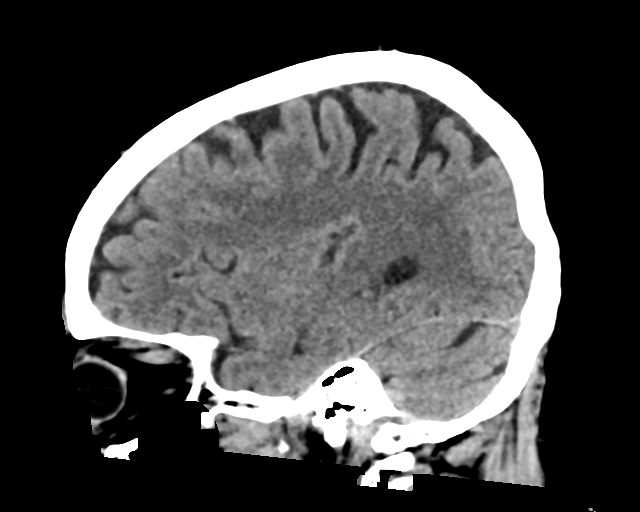
[im 30/60  brain]
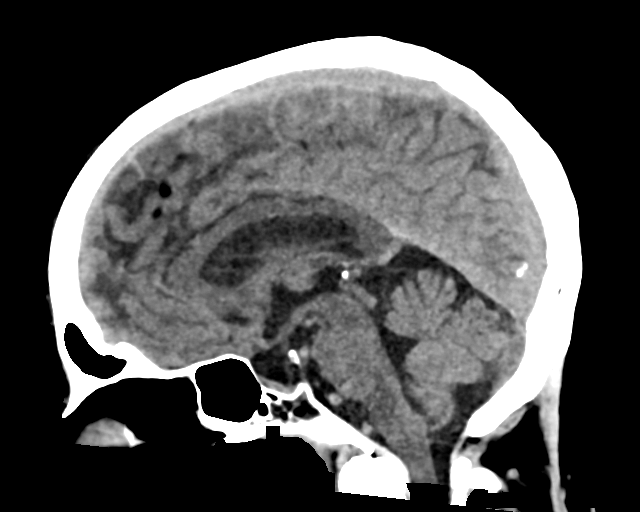
[im 40/60  brain]
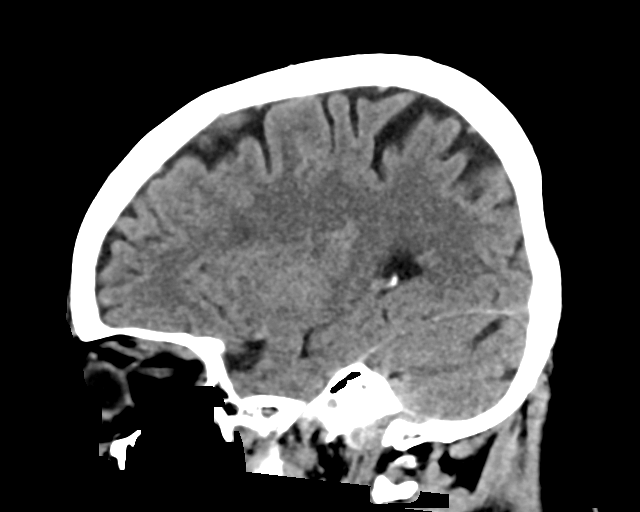

[17 of 47 positions shown; findings below may reference images not displayed]

FINDINGS: Brain: Small area of hypoattenuation in the right temporal lobe
adjacent to the sylvian fissure, with uncertain significance.
Otherwise no evidence of intracranial hemorrhage or mass effect.
Normal appearance of the ventricles.

Vascular: No hyperdense vessel or unexpected calcification.

Skull: Normal. Negative for fracture or focal lesion.

Sinuses/Orbits: No acute finding.

Other: None.
IMPRESSION: Subcentimeter area of hypoattenuation in the right temporal lobe,
adjacent to the sylvian fissure, with uncertain significance. This
may represent an age-indeterminate lacunar infarct, or dilated
perivascular space.

No evidence of intracranial hemorrhage.

## 2019-11-04 IMAGING — MR MR MRA HEAD W/O CM
11 of 14 series · 26 of 48 positions shown · IV contrast (multihance)
Comparison: 09/10/2017 CT head

CLINICAL DATA: 66 y/o  F; episode of slurred speech.

EXAM:
MR HEAD WITHOUT CONTRAST
MRA HEAD WITHOUT CONTRAST
MRA OF THE NECK WITHOUT AND WITH CONTRAST
TECHNIQUE: Multiplanar, multiecho pulse sequences of the brain and surrounding
structures were obtained without intravenous contrast. Angiographic
images of the neck were obtained using MRA technique without and
with intravenous contrast. Angiographic images of the head were
obtained using MRA technique without intravenous contrast.
CONTRAST:  20mL MULTIHANCE GADOBENATE DIMEGLUMINE 529 MG/ML IV SOLN

[Series 3: DWI · axial · 3.0mm · 0.94mm/px · z∈[-119,+28]mm · 5 of 100 slices shown (1 of 2)]
[im 1/100]
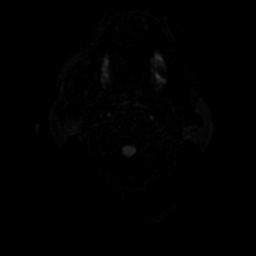
[im 25/100]
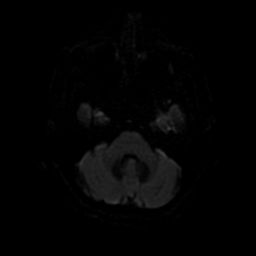
[im 50/100]
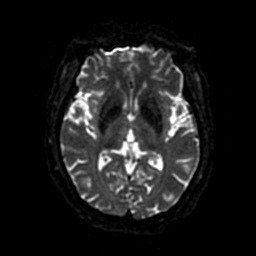
[im 75/100]
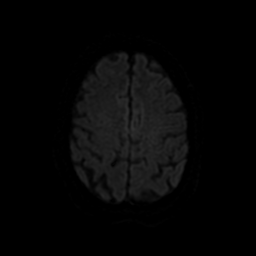
[im 100/100]
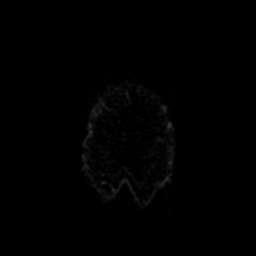

[Series 4: ax (id) 2 · axial · 1.0mm · 0.43mm/px · z∈[-98,-9]mm · 7 of 206 slices shown]
[im 1/206]
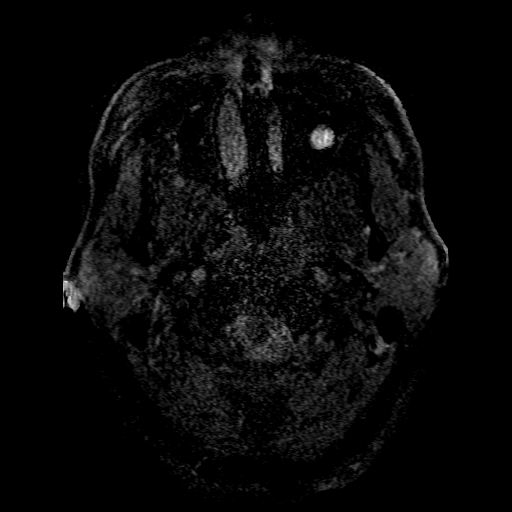
[im 23/206]
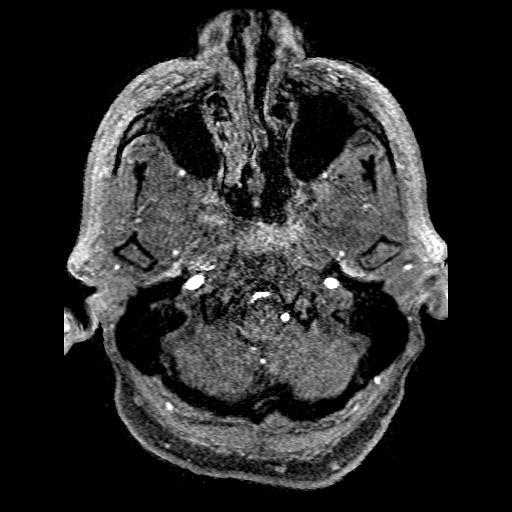
[im 69/206]
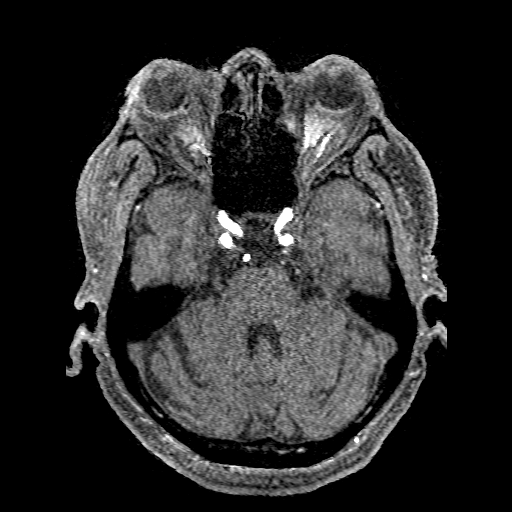
[im 92/206]
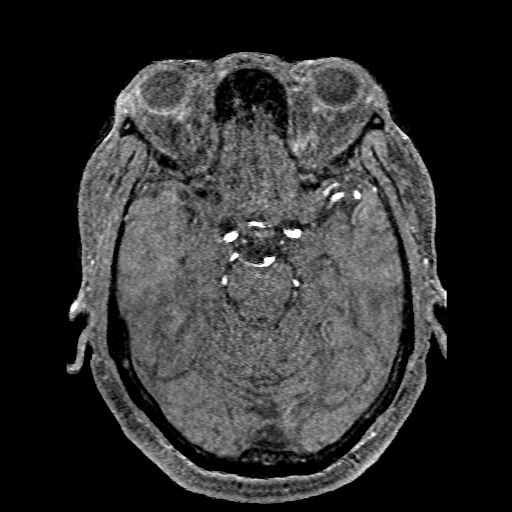
[im 114/206]
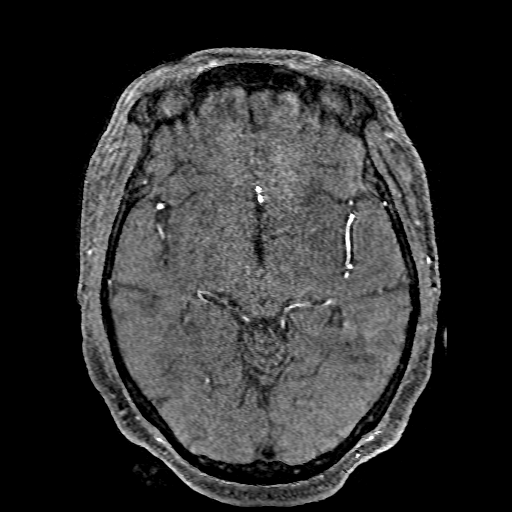
[im 137/206]
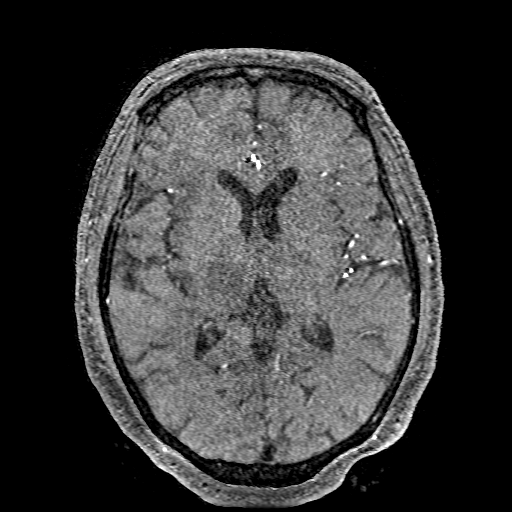
[im 183/206]
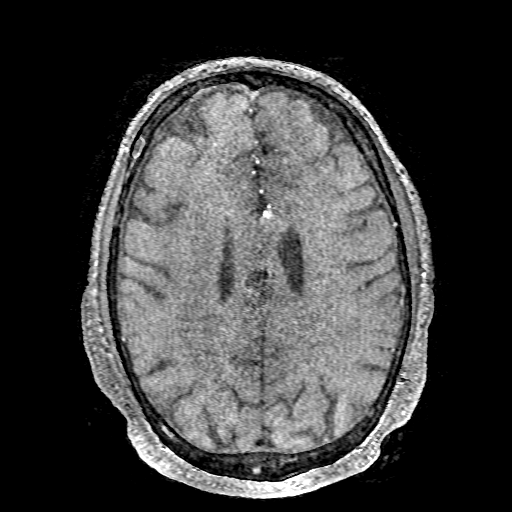

[Series 5: DWI · coronal · 4.0mm · 0.94mm/px · 4 of 72 slices shown (2 of 2)]
[im 1/72]
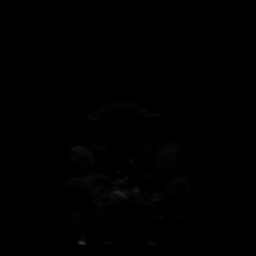
[im 24/72]
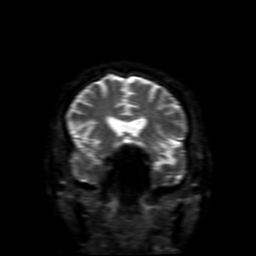
[im 48/72]
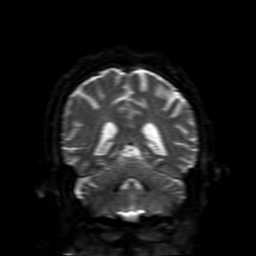
[im 72/72]
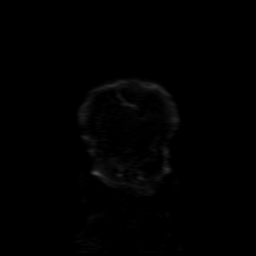

[Series 6: FLAIR · sagittal · 5.0mm · 0.47mm/px · 1 of 23 slices shown (1 of 2)]
[im 1/23]
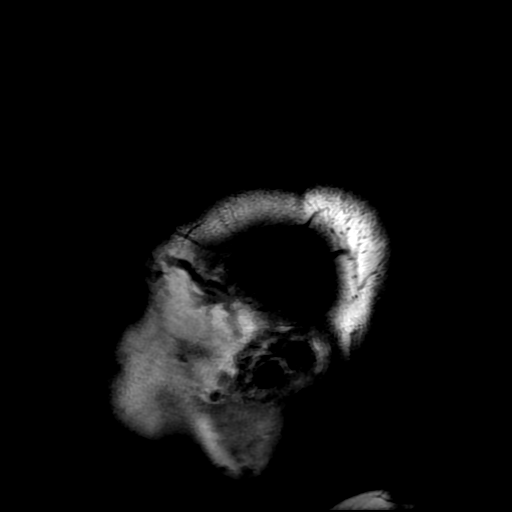

[Series 7: T2 · axial · 5.0mm · 0.43mm/px · 1 of 25 slices shown (1 of 2)]
[im 1/25]
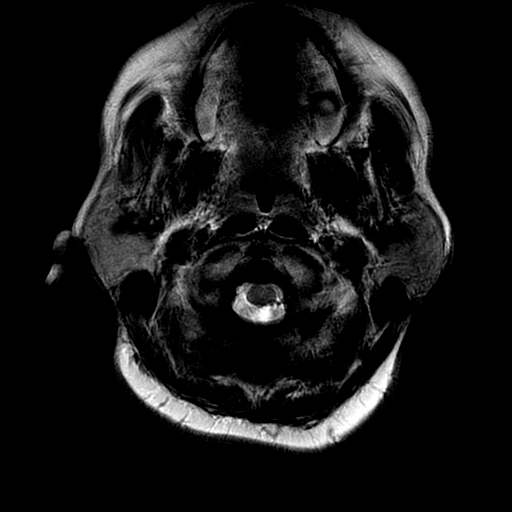

[Series 8: FLAIR · axial · 3.0mm · 0.43mm/px · 1 of 25 slices shown (2 of 2)]
[im 1/25]
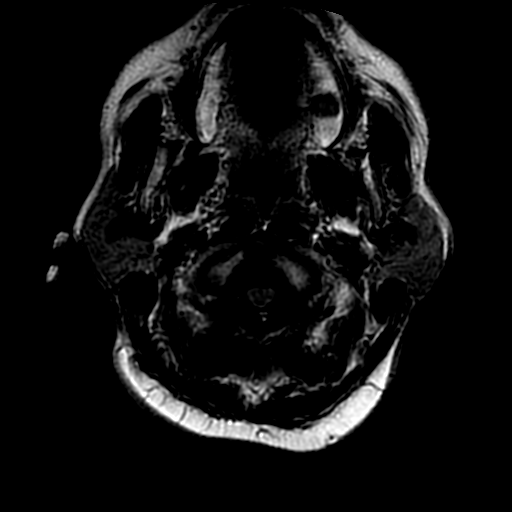

[Series 9: GRE · axial · 5.0mm · 0.43mm/px · 1 of 25 slices shown]
[im 1/25]
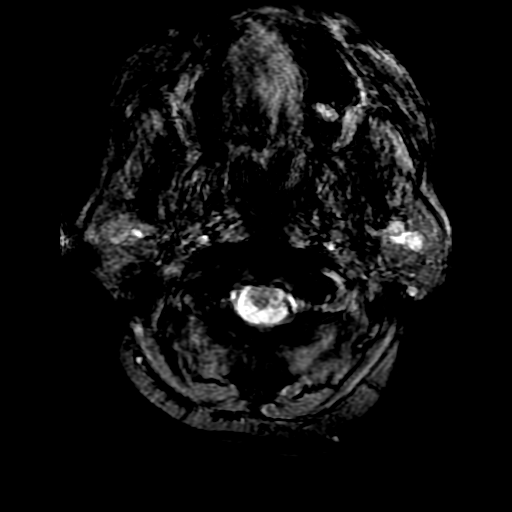

[Series 10: T1 · axial · 5.0mm · 0.43mm/px · 1 of 25 slices shown]
[im 1/25]
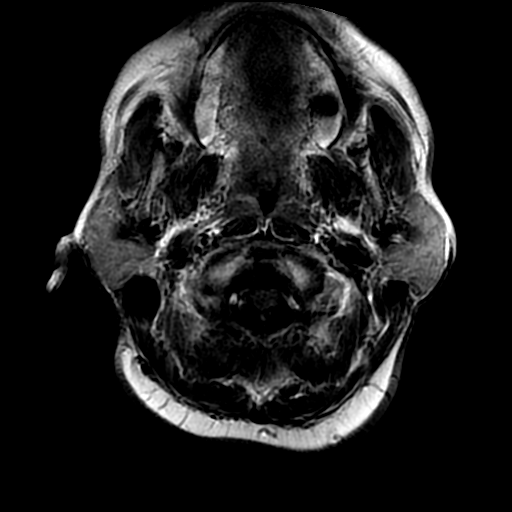

[Series 11: T2 · coronal · 5.0mm · 0.39mm/px · 1 of 30 slices shown (2 of 2)]
[im 1/30]
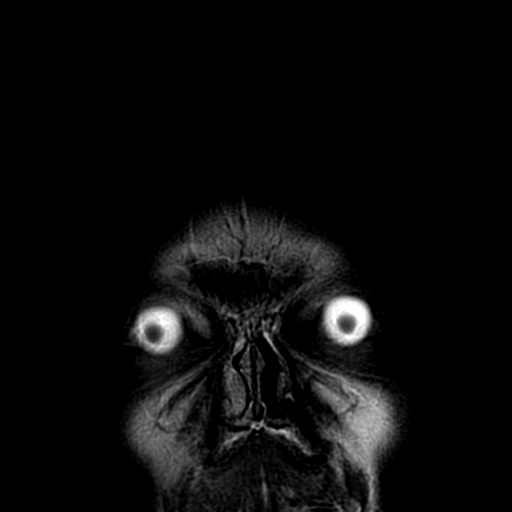

[Series 350: ADC · axial · 3.0mm · 0.94mm/px · z∈[-119,+28]mm · 2 of 50 slices shown (1 of 2)]
[im 1/50]
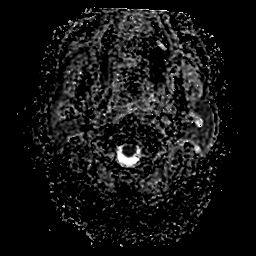
[im 50/50]
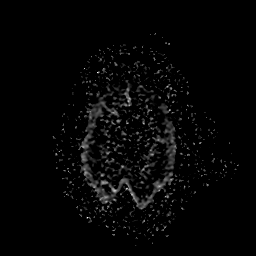

[Series 550: ADC · coronal · 4.0mm · 0.94mm/px · 2 of 36 slices shown (2 of 2)]
[im 1/36]
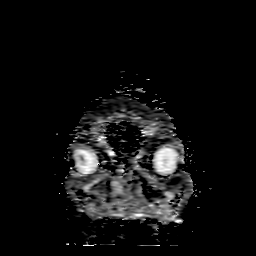
[im 36/36]
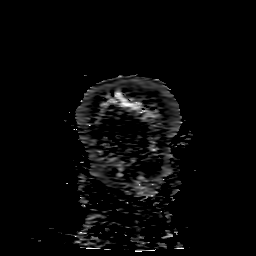

[26 of 48 positions shown; findings below may reference images not displayed]

FINDINGS: MR HEAD FINDINGS

Brain: Cortical reduced diffusion within the right posterior insula
and subcentimeter focus and right frontal operculum compatible with
acute/ early subacute infarction. Additionally there is a
subcentimeter focus of reduced diffusion in the left parietal cortex
(series 3, image 43).

Partially empty sella turcica. Few scattered nonspecific foci of T2
FLAIR hyperintense signal abnormality in subcortical and
periventricular white matter are compatible with mild chronic
microvascular ischemic changes for age. Mild brain parenchymal
volume loss. No focus of susceptibility hypointensity to indicate
intracranial hemorrhage. No mass effect, no extra-axial collection,
no hydrocephalus.

Vascular: Curvilinear susceptibility hypointensity within the right
posterior sylvian fissure in the region of infarction probably
representing arterial thrombus.

Skull and upper cervical spine: Normal marrow signal.

Sinuses/Orbits: Left maxillary sinus mucous retention cyst.
Otherwise negative.

Other: None.

MRA HEAD FINDINGS

Internal carotid arteries:  Patent.

Anterior cerebral arteries:  Patent.

Middle cerebral arteries: Patent left MCA and distal circulation.
Patent right M1 and right M2 superior division. Right inferior M2
division occlusion in mid sylvian fissure (series 452, image 15).

Anterior communicating artery: Patent.

Posterior communicating arteries:  Patent.  Fetal left PCA.

Posterior cerebral arteries:  Patent.

Basilar artery:  Patent.

Vertebral arteries:  Patent.

No evidence of high-grade stenosis, large vessel occlusion, or
aneurysm unless noted above.

MRA NECK FINDINGS

Aortic arch: Patent.

Right common carotid artery: Patent.

Right internal carotid artery: Patent.

Right vertebral artery: Patent.

Left common carotid artery: Patent.

Left Internal carotid artery: Patent.

Left Vertebral artery: Patent.

There is no evidence of hemodynamically significant stenosis by
NASCET criteria, occlusion, or aneurysm unless noted above.

Partially included intracranial circulation demonstrates right
inferior division M2 occlusion in mid sylvian fissure (series 44094
image 63).
IMPRESSION: 1. Acute/early subacute infarction of right posterior insula cortex
and small focus in frontal operculum. Additional subcentimeter
cortical infarction in left parietal lobe.
2. Right M2 inferior division occlusion in mid sylvian fissure.
3. Otherwise patent circle of Willis. No large vessel occlusion,
aneurysm, or significant stenosis is identified.
4. Patent carotid and vertebral arteries. No dissection, aneurysm,
or significant stenosis by NASCET criteria is identified.
5. Background of mild chronic microvascular ischemic changes and
mild parenchymal volume loss of the brain.
These results will be called to the ordering clinician or
representative by the Radiologist Assistant, and communication
documented in the PACS or zVision Dashboard.

By: Luisito Fodor M.D.

## 2019-11-10 ENCOUNTER — Other Ambulatory Visit: Payer: Self-pay | Admitting: Cardiology

## 2019-11-30 ENCOUNTER — Encounter: Payer: Self-pay | Admitting: Cardiology

## 2019-11-30 ENCOUNTER — Other Ambulatory Visit: Payer: Self-pay

## 2019-11-30 ENCOUNTER — Ambulatory Visit: Payer: Medicare HMO | Admitting: Cardiology

## 2019-11-30 VITALS — BP 172/92 | HR 74 | Ht 63.0 in | Wt 189.2 lb

## 2019-11-30 DIAGNOSIS — Z79899 Other long term (current) drug therapy: Secondary | ICD-10-CM | POA: Diagnosis not present

## 2019-11-30 DIAGNOSIS — I480 Paroxysmal atrial fibrillation: Secondary | ICD-10-CM | POA: Diagnosis not present

## 2019-11-30 LAB — BASIC METABOLIC PANEL
BUN/Creatinine Ratio: 20 (ref 12–28)
BUN: 19 mg/dL (ref 8–27)
CO2: 21 mmol/L (ref 20–29)
Calcium: 9.6 mg/dL (ref 8.7–10.3)
Chloride: 103 mmol/L (ref 96–106)
Creatinine, Ser: 0.97 mg/dL (ref 0.57–1.00)
GFR calc Af Amer: 70 mL/min/{1.73_m2} (ref >59)
GFR calc non Af Amer: 61 mL/min/{1.73_m2} (ref >59)
Glucose: 106 mg/dL — ABNORMAL HIGH (ref 65–99)
Potassium: 4.5 mmol/L (ref 3.5–5.2)
Sodium: 138 mmol/L (ref 134–144)

## 2019-11-30 LAB — CBC
Hematocrit: 40.5 % (ref 34.0–46.6)
Hemoglobin: 12.8 g/dL (ref 11.1–15.9)
MCH: 24.8 pg — ABNORMAL LOW (ref 26.6–33.0)
MCHC: 31.6 g/dL (ref 31.5–35.7)
MCV: 79 fL (ref 79–97)
Platelets: 213 10*3/uL (ref 150–450)
RBC: 5.16 x10E6/uL (ref 3.77–5.28)
RDW: 12.9 % (ref 11.7–15.4)
WBC: 4.2 10*3/uL (ref 3.4–10.8)

## 2019-11-30 LAB — MAGNESIUM: Magnesium: 1.9 mg/dL (ref 1.6–2.3)

## 2019-11-30 NOTE — Patient Instructions (Addendum)
Medication Instructions:  Your physician recommends that you continue on your current medications as directed. Please refer to the Current Medication list given to you today.  *If you need a refill on your cardiac medications before your next appointment, please call your pharmacy*   Lab Work: Today: BMET, CBC & Magnesium level If you have labs (blood work) drawn today and your tests are completely normal, you will receive your results only by: [place] MyChart Message (if you have MyChart) OR . A paper copy in the mail If you have any lab test that is abnormal or we need to change your treatment, we will call you to review the results.   Testing/Procedures: None ordered   Follow-Up: At [facility], you and your health needs are our priority.  As part of our continuing mission to provide you with exceptional heart care, we have created designated Provider Care Teams.  These Care Teams include your primary Cardiologist (physician) and Advanced Practice Providers (APPs -  Physician Assistants and Nurse Practitioners) who all work together to provide you with the care you need, when you need it.  Your next appointment:   6 month(s)  The format for your next appointment:   In Person  Provider:   You may see Will Meredith Leeds, MD or one of the following Advanced Practice Providers on your designated Care Team:    Chanetta Marshall, NP  Tommye Standard, PA-C  Legrand Como "[place]" [place], [place]    Thank you for choosing [facility]!!   Trinidad Curet, RN [phone]    Other Instructions

## 2019-11-30 NOTE — Progress Notes (Signed)
Electrophysiology Office Note   Date:  11/30/2019   ID:  [place], [place] 04/26/1951, MRN [id number]  PCP:  Alroy Dust, Carlean Jews.Marlou Sa, MD  Cardiologist:  Percival Spanish Primary Electrophysiologist:  Jarone Ostergaard Meredith Leeds, MD    No chief complaint on file.    History of Present Illness: [name] is a 69 y.o. female who is being seen today for the evaluation of atrial fibrillation/flutter at the request of Alroy Dust, L.Marlou Sa, MD. Presenting today for electrophysiology evaluation.  She has a history significant for atrial fibrillation and hypertrophic cardiomyopathy.  She is currently on dofetilide.  Today, denies symptoms of palpitations, chest pain, shortness of breath, orthopnea, PND, lower extremity edema, claudication, dizziness, presyncope, syncope, bleeding, or neurologic sequela. The patient is tolerating medications without difficulties.  Overall she is doing well.  She has had no chest pain or shortness of breath.  She did have both doses of the Covid vaccination.  Between doses, she had some palpitations and shortness of breath, but since that time she has had no further symptoms.   Past Medical History:  Diagnosis Date  . Atrial fibrillation ([place])   . Atrial flutter ([place])   . Hypertension   . Left ventricular hypertrophy    possibly hypertrophic cardiomyopathy  . Obesity   . Stroke [facility])    Past Surgical History:  Procedure Laterality Date  . ABDOMINAL HYSTERECTOMY    . ATRIAL FIBRILLATION ABLATION N/A 11/12/2018   Procedure: ATRIAL FIBRILLATION ABLATION;  Surgeon: Constance Haw, MD;  Location: [facility] CV LAB;  Service: Cardiovascular;  Laterality: N/A;  . TEE WITHOUT CARDIOVERSION N/A 11/11/2018   Procedure: TRANSESOPHAGEAL ECHOCARDIOGRAM (TEE);  Surgeon: Pixie Casino, MD;  Location: [facility] ENDOSCOPY;  Service: Cardiovascular;  Laterality: N/A;     Current Outpatient Medications  Medication Sig Dispense Refill  . atorvastatin (LIPITOR) 40 MG  tablet Take 1 tablet (40 mg total) by mouth daily at 6 PM. 90 tablet 3  . clotrimazole (ATHLETES FOOT) 1 % cream Apply 1 application topically 2 (two) times daily as needed (skin irritation).    . dofetilide (TIKOSYN) 250 MCG capsule Take 1 capsule (250 mcg total) by mouth 2 (two) times daily. 180 capsule 2  . Famotidine (PEPCID AC PO) Take by mouth.    . hydrocortisone 1 % lotion Apply 1 application topically daily. For dry skin on face.     Marland Kitchen losartan (COZAAR) 100 MG tablet Take 1 tablet (100 mg total) by mouth daily. 90 tablet 2  . magnesium oxide (MAG-OX) 400 MG tablet TAKE 2 TABLETS DAILY BY MOUTH. 180 tablet 3  . metoprolol tartrate (LOPRESSOR) 25 MG tablet Take 1 tablet (25 mg total) by mouth 2 (two) times daily. 180 tablet 2  . ondansetron (ZOFRAN-ODT) 8 MG disintegrating tablet TAKE 1 TABLET BY MOUTH TWICE A DAY AS NEEDED FOR NAUSEA    . potassium chloride SA (KLOR-CON M20) 20 MEQ tablet Take 1 tablet (20 mEq total) by mouth daily. 90 tablet 2  . XARELTO 20 MG TABS tablet TAKE 1 TABLET (20 MG TOTAL) BY MOUTH DAILY WITH SUPPER. 90 tablet 0   No current facility-administered medications for this visit.    Allergies:   Latex   Social History:  The patient  reports that she has quit smoking. Her smoking use included cigarettes. She has never used smokeless tobacco. She reports that she does not drink alcohol or use drugs.   Family History:  The patient's family history includes Diabetes in her daughter; Heart failure  in her mother; Stroke in her paternal grandfather.    ROS:  Please see the history of present illness.   Otherwise, review of systems is positive for none.   All other systems are reviewed and negative.   PHYSICAL EXAM: VS:  BP (!) 172/92   Pulse 74   Ht 5\' 3"  (1.6 m)   Wt 189 lb 3.2 oz (85.8 kg)   SpO2 98%   BMI 33.52 kg/m  , BMI Body mass index is 33.52 kg/m. GEN: Well nourished, well developed, in no acute distress  HEENT: normal  Neck: no JVD, carotid bruits,  or masses Cardiac: RRR; no murmurs, rubs, or gallops,no edema  Respiratory:  clear to auscultation bilaterally, normal work of breathing GI: soft, nontender, nondistended, + BS MS: no deformity or atrophy  Skin: warm and dry Neuro:  Strength and sensation are intact Psych: euthymic mood, full affect  EKG:  EKG is ordered today. Personal review of the ekg ordered shows sinus rhythm, rate 74, QTc 457 ms, diffuse T wave inversions   Recent Labs: 05/18/2019: BUN 15; Creatinine, Ser 1.05; Magnesium 2.0; Potassium 4.2; Sodium 136    Lipid Panel     Component Value Date/Time   CHOL 136 09/11/2017 0345   TRIG 53 09/11/2017 0345   HDL 37 (L) 09/11/2017 0345   CHOLHDL 3.7 09/11/2017 0345   VLDL 11 09/11/2017 0345   LDLCALC 88 09/11/2017 0345     Wt Readings from Last 3 Encounters:  11/30/19 189 lb 3.2 oz (85.8 kg)  05/20/19 179 lb (81.2 kg)  05/18/19 179 lb 6.4 oz (81.4 kg)      Other studies Reviewed: Additional studies/ records that were reviewed today include: TTE 03/03/18  Review of the above records today demonstrates:  - Left ventricle: The cavity size was normal. Wall thickness was   increased in a pattern of moderate LVH. Systolic function was   normal. The estimated ejection fraction was in the range of 60%   to 65%. Wall motion was normal; there were no regional wall   motion abnormalities. Doppler parameters are consistent with a   reversible restrictive pattern, indicative of decreased left   ventricular diastolic compliance and/or increased left atrial   pressure (grade 3 diastolic dysfunction). - Aortic valve: There was trivial regurgitation. - Left atrium: The atrium was severely dilated. - Right atrium: The atrium was moderately dilated. - Pulmonary arteries: Systolic pressure was moderately increased.   PA peak pressure: 45 mm Hg (S).  Holter 10/06/18 - personally reviewed NSR PAF and atrial flutter Occasional atrial ectopy   ASSESSMENT AND PLAN:  1.   Paroxysmal atrial fibrillation/atrial flutter: Currently on dofetilide and Xarelto.  Status post AF ablation 11/12/2018.  CHA2DS2-VASc is 3.  Fortunately she remains in sinus rhythm.  No changes at this time.   2.  Hypertension: Blood pressure is elevated in clinic today.  She does bring in blood pressure recordings from home that are consistently less than 130/80.  No changes at this time.  3.  Hypertrophic cardiomyopathy: Echo shows moderate LVH with a restrictive pattern of grade 3 diastolic dysfunction.  No obvious volume overload.  No changes.    Current medicines are reviewed at length with the patient today.   The patient does not have concerns regarding her medicines.  The following changes were made today: None  Labs/ tests ordered today include:  Orders Placed This Encounter  Procedures  . Basic metabolic panel  . CBC  . Magnesium  .  EKG 12-Lead    Disposition:   FU with Wood Novacek 6 months  Signed, Merland Holness Meredith Leeds, MD  11/30/2019 9:04 AM     [facility] [address] [place] [address] [phone] (office) [phone] (fax)

## 2020-01-27 DIAGNOSIS — H524 Presbyopia: Secondary | ICD-10-CM | POA: Diagnosis not present

## 2020-02-02 DIAGNOSIS — Z0100 Encounter for examination of eyes and vision without abnormal findings: Secondary | ICD-10-CM | POA: Diagnosis not present

## 2020-02-09 ENCOUNTER — Other Ambulatory Visit: Payer: Self-pay | Admitting: Cardiology

## 2020-02-09 NOTE — Telephone Encounter (Signed)
  *  STAT* If patient is at the pharmacy, call can be transferred to refill team.   1. Which medications need to be refilled? (please list name of each medication and dose if known) XARELTO 20 MG TABS tablet  2. Which pharmacy/location (including street and city if local pharmacy) is medication to be sent to? [id number] [place] [facility] [place]  3. Do they need a 30 day or 90 day supply? 90 days

## 2020-02-17 ENCOUNTER — Other Ambulatory Visit: Payer: Self-pay | Admitting: Cardiology

## 2020-02-17 MED ORDER — MAGNESIUM OXIDE 400 MG PO TABS: ORAL_TABLET | ORAL | 0 refills | Status: DC

## 2020-02-17 NOTE — Telephone Encounter (Signed)
*  STAT* If patient is at the pharmacy, call can be transferred to refill team.   1. Which medications need to be refilled? (please list name of each medication and dose if known)  magnesium oxide (MAG-OX) 400 MG tablet  2. Which pharmacy/location (including street and city if local pharmacy) is medication to be sent to?  [id number] [place], [place]     3. Do they need a 30 day or 90 day supply? 90   Patient called because the pharmacy has been trying to reach out to Dr. Percival Spanish for approval to refill thie rx but the pharmacy has not heard anything yet.

## 2020-03-07 MED ORDER — RIVAROXABAN 20 MG PO TABS: ORAL_TABLET | ORAL | 0 refills | Status: DC

## 2020-03-07 NOTE — Telephone Encounter (Signed)
*  STAT* If patient is at the pharmacy, call can be transferred to refill team.   1. Which medications need to be refilled? (please list name of each medication and dose if known) XARELTO 20 MG TABS tablet  2. Which pharmacy/location (including street and city if local pharmacy) is medication to be sent to? [id number] [place] [place] - [place]  3. Do they need a 30 day or 90 day supply? 90 day supply

## 2020-03-13 ENCOUNTER — Other Ambulatory Visit: Payer: Self-pay | Admitting: Family Medicine

## 2020-03-13 DIAGNOSIS — Z1231 Encounter for screening mammogram for malignant neoplasm of breast: Secondary | ICD-10-CM

## 2020-04-06 DIAGNOSIS — I480 Paroxysmal atrial fibrillation: Secondary | ICD-10-CM | POA: Diagnosis not present

## 2020-04-06 DIAGNOSIS — I10 Essential (primary) hypertension: Secondary | ICD-10-CM | POA: Diagnosis not present

## 2020-04-06 DIAGNOSIS — D6869 Other thrombophilia: Secondary | ICD-10-CM | POA: Diagnosis not present

## 2020-04-06 DIAGNOSIS — E669 Obesity, unspecified: Secondary | ICD-10-CM | POA: Diagnosis not present

## 2020-04-06 DIAGNOSIS — R7303 Prediabetes: Secondary | ICD-10-CM | POA: Diagnosis not present

## 2020-04-06 DIAGNOSIS — Z23 Encounter for immunization: Secondary | ICD-10-CM | POA: Diagnosis not present

## 2020-04-06 DIAGNOSIS — Z0000 Encounter for general adult medical examination without abnormal findings: Secondary | ICD-10-CM | POA: Diagnosis not present

## 2020-04-06 DIAGNOSIS — Z8673 Personal history of transient ischemic attack (TIA), and cerebral infarction without residual deficits: Secondary | ICD-10-CM | POA: Diagnosis not present

## 2020-04-11 ENCOUNTER — Other Ambulatory Visit: Payer: Self-pay | Admitting: Family Medicine

## 2020-04-11 DIAGNOSIS — E2839 Other primary ovarian failure: Secondary | ICD-10-CM

## 2020-04-27 ENCOUNTER — Other Ambulatory Visit: Payer: Self-pay

## 2020-04-27 ENCOUNTER — Ambulatory Visit
Admission: RE | Admit: 2020-04-27 | Discharge: 2020-04-27 | Disposition: A | Discharge status: Home / Self Care | Payer: Medicare HMO | Source: Ambulatory Visit | Attending: Family Medicine | Admitting: Family Medicine

## 2020-04-27 DIAGNOSIS — Z1231 Encounter for screening mammogram for malignant neoplasm of breast: Secondary | ICD-10-CM | POA: Diagnosis not present

## 2020-05-18 ENCOUNTER — Telehealth: Payer: Self-pay | Admitting: Cardiology

## 2020-05-18 NOTE — Telephone Encounter (Signed)
*  STAT* If patient is at the pharmacy, call can be transferred to refill team.   1. Which medications need to be refilled? (please list name of each medication and dose if known) Magnesium OXide  2. Which pharmacy/location (including street and city if local pharmacy) is medication to be sent to [place] [facility]  3. Do they need a 30 day or 90 day supply? 180 and refills

## 2020-05-19 ENCOUNTER — Other Ambulatory Visit: Payer: Self-pay

## 2020-05-19 MED ORDER — MAGNESIUM OXIDE 400 MG PO TABS: ORAL_TABLET | ORAL | 1 refills | Status: DC

## 2020-05-24 DIAGNOSIS — Z7189 Other specified counseling: Secondary | ICD-10-CM | POA: Insufficient documentation

## 2020-05-24 NOTE — Progress Notes (Signed)
Cardiology Office Note   Date:  05/25/2020   ID:  [place], [place] [birth year], MRN [id number]  PCP:  Alroy Dust, Carlean Jews.Marlou Sa, MD  Cardiologist:   Minus Breeding, MD  Chief Complaint  Patient presents with  . Atrial Fibrillation      History of Present Illness: [name] is a 69 y.o. female who presents for followup of atrial fibrillation and hypertrophic cardiomyopathy. She has been followed in the atrial fib clinic.  She had ablation of her atrial fib by Dr. Curt Bears.  Since I last saw her she has done well.     She is walking routinely.  The patient denies any new symptoms such as chest discomfort, neck or arm discomfort. There has been no new shortness of breath, PND or orthopnea. There have been no reported palpitations, presyncope or syncope.    Past Medical History:  Diagnosis Date  . Atrial fibrillation ([place])   . Atrial flutter ([place])   . Hypertension   . Left ventricular hypertrophy    possibly hypertrophic cardiomyopathy  . Obesity   . Stroke [facility])     Past Surgical History:  Procedure Laterality Date  . ABDOMINAL HYSTERECTOMY    . ATRIAL FIBRILLATION ABLATION N/A 11/12/2018   Procedure: ATRIAL FIBRILLATION ABLATION;  Surgeon: Constance Haw, MD;  Location: [place] [facility];  Service: Cardiovascular;  Laterality: N/A;  . TEE WITHOUT CARDIOVERSION N/A 11/11/2018   Procedure: TRANSESOPHAGEAL ECHOCARDIOGRAM (TEE);  Surgeon: Pixie Casino, MD;  Location: [facility] ENDOSCOPY;  Service: Cardiovascular;  Laterality: N/A;     Current Outpatient Medications  Medication Sig Dispense Refill  . atorvastatin (LIPITOR) 40 MG tablet Take 1 tablet (40 mg total) by mouth daily at 6 PM. 90 tablet 3  . clotrimazole (ATHLETES FOOT) 1 % cream Apply 1 application topically 2 (two) times daily as needed (skin irritation).    . dofetilide (TIKOSYN) 250 MCG capsule Take 1 capsule (250 mcg total) by mouth 2 (two) times daily. 180 capsule 2  . hydrocortisone 1 %  lotion Apply 1 application topically daily. For dry skin on face.     Marland Kitchen losartan (COZAAR) 100 MG tablet Take 1 tablet (100 mg total) by mouth daily. 90 tablet 2  . magnesium oxide (MAG-OX) 400 MG tablet TAKE 2 TABLETS DAILY BY MOUTH. 60 tablet 1  . metoprolol tartrate (LOPRESSOR) 25 MG tablet Take 1 tablet (25 mg total) by mouth 2 (two) times daily. 180 tablet 2  . ondansetron (ZOFRAN-ODT) 8 MG disintegrating tablet TAKE 1 TABLET BY MOUTH TWICE A DAY AS NEEDED FOR NAUSEA    . potassium chloride SA (KLOR-CON M20) 20 MEQ tablet Take 1 tablet (20 mEq total) by mouth daily. 90 tablet 2  . rivaroxaban (XARELTO) 20 MG TABS tablet TAKE 1 TABLET (20 MG TOTAL) BY MOUTH DAILY WITH SUPPER. 90 tablet 0   No current facility-administered medications for this visit.    Allergies:   Latex    ROS:  Please see the history of present illness.   Otherwise, review of systems are positive for none.   All other systems are reviewed and negative.    PHYSICAL EXAM: VS:  BP (!) 144/76   Pulse 61   Ht 5\' 3"  (1.6 m)   Wt 186 lb 12.8 oz (84.7 kg)   SpO2 97%   BMI 33.09 kg/m  , BMI Body mass index is 33.09 kg/m. GENERAL:  Well appearing NECK:  No jugular venous distention, waveform within normal limits, carotid upstroke  brisk and symmetric, no bruits, no thyromegaly LUNGS:  Clear to auscultation bilaterally CHEST:  Unremarkable HEART:  PMI not displaced or sustained,S1 and S2 within normal limits, no S3, no S4, no clicks, no rubs, no murmurs ABD:  Flat, positive bowel sounds normal in frequency in pitch, no bruits, no rebound, no guarding, no midline pulsatile mass, no hepatomegaly, no splenomegaly EXT:  2 plus pulses throughout, no edema, no cyanosis no clubbing  EKG:  EKG is ordered today. Sinus rhythm, rate 61, premature atrial contractions, left ventricular hypertrophy with repolarization changes, no change from previous.   Recent Labs: 11/30/2019: BUN 19; Creatinine, Ser 0.97; Hemoglobin 12.8;  Magnesium 1.9; Platelets 213; Potassium 4.5; Sodium 138    Lipid Panel    Component Value Date/Time   CHOL 136 09/11/2017 0345   TRIG 53 09/11/2017 0345   HDL 37 (L) 09/11/2017 0345   CHOLHDL 3.7 09/11/2017 0345   VLDL 11 09/11/2017 0345   LDLCALC 88 09/11/2017 0345      Wt Readings from Last 3 Encounters:  05/25/20 186 lb 12.8 oz (84.7 kg)  11/30/19 189 lb 3.2 oz (85.8 kg)  05/20/19 179 lb (81.2 kg)      Other studies Reviewed: Additional studies/ records that were reviewed today include: Labs. Review of the above records demonstrates:  Please see elsewhere in the note.     ASSESSMENT AND PLAN:  ATRIAL FIB -   [name] a CHA2DS2 - VASc score of 2 she had very few palpitations.  She is not having any sustained arrhythmias.  She tolerates anticoagulation.  No change in therapy.   HYPERTENSION, BENIGN -  Her blood pressure is well controlled.  She brings me a blood pressure diary.  It is a little bit elevated today but typically not at home.   HYPERTROPHIC CARDIOMYOPATHY She has mild LVH.  I will follow this with an echo in June and see her after that.  PULMONARY HTN This was not mentioned on the TEE done in February.  I do not think she has this clinically but I will follow up with the echo as above.  OVERWEIGHT MRI possible He has gained some weight and we talked about this.    COVID EDUCATION:  She has had her vaccine.   Current medicines are reviewed at length with the patient today.  The patient does not have concerns regarding medicines.  The following changes have been made:  None  Labs/ tests ordered today include:   Orders Placed This Encounter  Procedures  . ECHOCARDIOGRAM COMPLETE     Disposition:   FU with me in June.   Signed, Minus Breeding, MD  05/25/2020 1:18 PM    [facility]

## 2020-05-25 ENCOUNTER — Other Ambulatory Visit: Payer: Self-pay

## 2020-05-25 ENCOUNTER — Ambulatory Visit: Payer: Medicare HMO | Admitting: Cardiology

## 2020-05-25 ENCOUNTER — Encounter: Payer: Self-pay | Admitting: Cardiology

## 2020-05-25 VITALS — BP 144/76 | HR 61 | Ht 63.0 in | Wt 186.8 lb

## 2020-05-25 DIAGNOSIS — I4819 Other persistent atrial fibrillation: Secondary | ICD-10-CM

## 2020-05-25 DIAGNOSIS — I10 Essential (primary) hypertension: Secondary | ICD-10-CM

## 2020-05-25 DIAGNOSIS — G459 Transient cerebral ischemic attack, unspecified: Secondary | ICD-10-CM | POA: Diagnosis not present

## 2020-05-25 DIAGNOSIS — I272 Pulmonary hypertension, unspecified: Secondary | ICD-10-CM

## 2020-05-25 DIAGNOSIS — Z7189 Other specified counseling: Secondary | ICD-10-CM

## 2020-05-25 MED ORDER — DOFETILIDE 250 MCG PO CAPS: 250.0000 ug | ORAL_CAPSULE | Freq: Two times a day (BID) | ORAL | 2 refills | Status: DC

## 2020-05-25 NOTE — Patient Instructions (Signed)
Medication Instructions:  No Changes In Medications at this time.  *If you need a refill on your cardiac medications before your next appointment, please call your pharmacy*   Lab Work: None Ordered At This Time.  If you have labs (blood work) drawn today and your tests are completely normal, you will receive your results only by: Marland Kitchen MyChart Message (if you have MyChart) OR . A paper copy in the mail If you have any lab test that is abnormal or we need to change your treatment, we will call you to review the results.   Testing/Procedures: Your physician has requested that you have an echocardiogram in June of 2022. Echocardiography is a painless test that uses sound waves to create images of your heart. It provides your doctor with information about the size and shape of your heart and how well your heart's chambers and valves are working. You may receive an ultrasound enhancing agent through an IV if needed to better visualize your heart during the echo.This procedure takes approximately one hour. There are no restrictions for this procedure. This will take place at the [address], [address].   Follow-Up: At [facility], you and your health needs are our priority.  As part of our continuing mission to provide you with exceptional heart care, we have created designated Provider Care Teams.  These Care Teams include your primary Cardiologist (physician) and Advanced Practice Providers (APPs -  Physician Assistants and Nurse Practitioners) who all work together to provide you with the care you need, when you need it.  Your next appointment:   9 month(s) follow up after Echo   The format for your next appointment:   In Person  Provider:   Minus Breeding, MD

## 2020-05-29 ENCOUNTER — Other Ambulatory Visit: Payer: Self-pay | Admitting: Cardiology

## 2020-05-29 NOTE — Telephone Encounter (Signed)
°*  STAT* If patient is at the pharmacy, call can be transferred to refill team.   1. Which medications need to be refilled? (please list name of each medication and dose if known) rivaroxaban (XARELTO) 20 MG TABS tablet  2. Which pharmacy/location (including street and city if local pharmacy) is medication to be sent to? [id number] [place], [place] [facility] [place]  3. Do they need a 30 day or 90 day supply? 90 day supply

## 2020-05-30 MED ORDER — RIVAROXABAN 20 MG PO TABS: ORAL_TABLET | ORAL | 0 refills | Status: DC

## 2020-06-01 NOTE — Addendum Note (Signed)
Addended by: Zebedee Iba on: 06/01/2020 04:05 PM   Modules accepted: Orders

## 2020-06-06 ENCOUNTER — Other Ambulatory Visit: Payer: Self-pay | Admitting: Cardiology

## 2020-06-06 ENCOUNTER — Ambulatory Visit: Payer: Medicare HMO | Admitting: Physician Assistant

## 2020-06-06 MED ORDER — METOPROLOL TARTRATE 25 MG PO TABS: 25.0000 mg | ORAL_TABLET | Freq: Two times a day (BID) | ORAL | 3 refills | Status: DC

## 2020-06-06 NOTE — Telephone Encounter (Signed)
° °*  STAT* If patient is at the pharmacy, call can be transferred to refill team.   1. Which medications need to be refilled? (please list name of each medication and dose if known)   metoprolol tartrate (LOPRESSOR) 25 MG tablet  2. Which pharmacy/location (including street and city if local pharmacy) is medication to be sent to?  [id number] [place], [place] - [place]  3. Do they need a 30 day or 90 day supply? 90 days

## 2020-06-07 ENCOUNTER — Other Ambulatory Visit: Payer: Self-pay | Admitting: Cardiology

## 2020-06-07 NOTE — Telephone Encounter (Signed)
*  STAT* If patient is at the pharmacy, call can be transferred to refill team.   1. Which medications need to be refilled? (please list name of each medication and dose if known) potassium chloride SA (KLOR-CON M20) 20 MEQ tablet  2. Which pharmacy/location (including street and city if local pharmacy) is medication to be sent to? [id number] [place], [place] - [place]  3. Do they need a 30 day or 90 day supply? [name]

## 2020-06-08 ENCOUNTER — Other Ambulatory Visit: Payer: Self-pay

## 2020-06-08 ENCOUNTER — Ambulatory Visit
Admission: RE | Admit: 2020-06-08 | Discharge: 2020-06-08 | Disposition: A | Discharge status: Home / Self Care | Payer: Medicare HMO | Source: Ambulatory Visit | Attending: Family Medicine | Admitting: Family Medicine

## 2020-06-08 DIAGNOSIS — E2839 Other primary ovarian failure: Secondary | ICD-10-CM

## 2020-06-08 DIAGNOSIS — M85851 Other specified disorders of bone density and structure, right thigh: Secondary | ICD-10-CM | POA: Diagnosis not present

## 2020-06-08 DIAGNOSIS — M85852 Other specified disorders of bone density and structure, left thigh: Secondary | ICD-10-CM | POA: Diagnosis not present

## 2020-06-08 MED ORDER — POTASSIUM CHLORIDE CRYS ER 20 MEQ PO TBCR: 20.0000 meq | EXTENDED_RELEASE_TABLET | Freq: Every day | ORAL | 2 refills | Status: DC

## 2020-06-08 NOTE — Telephone Encounter (Signed)
Refill sent to pharmacy.   

## 2020-06-19 ENCOUNTER — Other Ambulatory Visit: Payer: Self-pay | Admitting: Cardiology

## 2020-06-19 MED ORDER — MAGNESIUM OXIDE 400 MG PO TABS: ORAL_TABLET | ORAL | 3 refills | Status: DC

## 2020-06-19 MED ORDER — ATORVASTATIN CALCIUM 40 MG PO TABS
40.0000 mg | ORAL_TABLET | Freq: Every day | ORAL | 3 refills | Status: DC
Start: 2020-06-19 — End: 2021-06-11

## 2020-06-19 NOTE — Telephone Encounter (Signed)
*  STAT* If patient is at the pharmacy, call can be transferred to refill team.   1. Which medications need to be refilled? (please list name of each medication and dose if known)  atorvastatin (LIPITOR) 40 MG tablet magnesium oxide (MAG-OX) 400 MG tablet 2. Which pharmacy/location (including street and city if local pharmacy) is medication to be sent to? [id number] [place], [place] - [place]  3. Do they need a 30 day or 90 day supply? 90 day supply

## 2020-06-20 ENCOUNTER — Ambulatory Visit: Payer: Medicare HMO | Attending: Internal Medicine

## 2020-06-20 DIAGNOSIS — Z23 Encounter for immunization: Secondary | ICD-10-CM

## 2020-06-20 NOTE — Progress Notes (Signed)
   Covid-19 Vaccination Clinic  Name:  Ajwa Kimberley    MRN: [id number] DOB: Aug 20, 1951  06/20/2020  Ms. Esty was observed post Covid-19 immunization for 15 minutes without incident. She was provided with Vaccine Information Sheet and instruction to access the V-Safe system.   Ms. Nangle was instructed to call 911 with any severe reactions post vaccine: Marland Kitchen Difficulty breathing  . Swelling of face and throat  . A fast heartbeat  . A bad rash all over body  . Dizziness and weakness

## 2020-07-17 ENCOUNTER — Other Ambulatory Visit: Payer: Self-pay | Admitting: Cardiology

## 2020-07-17 NOTE — Telephone Encounter (Signed)
    *  STAT* If patient is at the pharmacy, call can be transferred to refill team.   1. Which medications need to be refilled? (please list name of each medication and dose if known) potassium chloride SA (KLOR-CON M20) 20 MEQ tablet  2. Which pharmacy/location (including street and city if local pharmacy) is medication to be sent to?[id number] [place], [place] - [place]  3. Do they need a 30 day or 90 day supply? 90 days

## 2020-07-25 ENCOUNTER — Other Ambulatory Visit: Payer: Self-pay | Admitting: Cardiology

## 2020-07-25 NOTE — Telephone Encounter (Signed)
   *  STAT* If patient is at the pharmacy, call can be transferred to refill team.   1. Which medications need to be refilled? (please list name of each medication and dose if known)   losartan (COZAAR) 100 MG tablet    2. Which pharmacy/location (including street and city if local pharmacy) is medication to be sent to? [id number] [place], [place] - [place]  3. Do they need a 30 day or 90 day supply? 90 days

## 2020-07-26 MED ORDER — LOSARTAN POTASSIUM 100 MG PO TABS
100.0000 mg | ORAL_TABLET | Freq: Every day | ORAL | 2 refills | Status: DC
Start: 2020-07-26 — End: 2021-04-17

## 2020-08-22 ENCOUNTER — Other Ambulatory Visit: Payer: Self-pay | Admitting: Cardiology

## 2020-08-22 MED ORDER — RIVAROXABAN 20 MG PO TABS
ORAL_TABLET | ORAL | 3 refills | Status: DC
Start: 2020-08-22 — End: 2020-09-07

## 2020-08-22 NOTE — Telephone Encounter (Signed)
*  STAT* If patient is at the pharmacy, call can be transferred to refill team.   1. Which medications need to be refilled? (please list name of each medication and dose if known)  rivaroxaban (XARELTO) 20 MG TABS tablet  2. Which pharmacy/location (including street and city if local pharmacy) is medication to be sent to? [id number] [place], [place] - [place]  3. Do they need a 30 day or 90 day supply? 90 day supply

## 2020-08-29 ENCOUNTER — Other Ambulatory Visit: Payer: Medicare HMO

## 2020-08-29 DIAGNOSIS — Z20822 Contact with and (suspected) exposure to covid-19: Secondary | ICD-10-CM | POA: Diagnosis not present

## 2020-08-30 LAB — SARS-COV-2, NAA 2 DAY TAT

## 2020-08-30 LAB — NOVEL CORONAVIRUS, NAA: SARS-CoV-2, NAA: NOT DETECTED

## 2020-09-07 ENCOUNTER — Other Ambulatory Visit: Payer: Self-pay | Admitting: Cardiology

## 2020-10-06 DIAGNOSIS — E669 Obesity, unspecified: Secondary | ICD-10-CM | POA: Diagnosis not present

## 2020-10-06 DIAGNOSIS — I4891 Unspecified atrial fibrillation: Secondary | ICD-10-CM | POA: Diagnosis not present

## 2020-10-06 DIAGNOSIS — Z23 Encounter for immunization: Secondary | ICD-10-CM | POA: Diagnosis not present

## 2020-10-06 DIAGNOSIS — I10 Essential (primary) hypertension: Secondary | ICD-10-CM | POA: Diagnosis not present

## 2020-10-06 DIAGNOSIS — Z8673 Personal history of transient ischemic attack (TIA), and cerebral infarction without residual deficits: Secondary | ICD-10-CM | POA: Diagnosis not present

## 2020-10-06 DIAGNOSIS — R7303 Prediabetes: Secondary | ICD-10-CM | POA: Diagnosis not present

## 2021-02-14 ENCOUNTER — Other Ambulatory Visit: Payer: Self-pay | Admitting: Cardiology

## 2021-02-14 MED ORDER — DOFETILIDE 250 MCG PO CAPS
250.0000 ug | ORAL_CAPSULE | Freq: Two times a day (BID) | ORAL | 0 refills | Status: DC
Start: 2021-02-14 — End: 2021-05-11

## 2021-02-14 NOTE — Telephone Encounter (Signed)
Dofetilide refilled

## 2021-02-22 ENCOUNTER — Ambulatory Visit (HOSPITAL_COMMUNITY): Payer: Medicare HMO | Attending: Cardiology

## 2021-02-22 ENCOUNTER — Other Ambulatory Visit: Payer: Self-pay

## 2021-02-22 DIAGNOSIS — G459 Transient cerebral ischemic attack, unspecified: Secondary | ICD-10-CM

## 2021-02-22 DIAGNOSIS — I4819 Other persistent atrial fibrillation: Secondary | ICD-10-CM

## 2021-02-22 DIAGNOSIS — I421 Obstructive hypertrophic cardiomyopathy: Secondary | ICD-10-CM

## 2021-02-22 DIAGNOSIS — I10 Essential (primary) hypertension: Secondary | ICD-10-CM

## 2021-02-22 DIAGNOSIS — I272 Pulmonary hypertension, unspecified: Secondary | ICD-10-CM | POA: Diagnosis not present

## 2021-02-22 LAB — ECHOCARDIOGRAM COMPLETE
P 1/2 time: 601 msec
S' Lateral: 2.8 cm

## 2021-02-22 MED ORDER — PERFLUTREN LIPID MICROSPHERE
1.0000 mL | INTRAVENOUS | Status: AC | PRN
  Administered 2021-02-22: 2 mL via INTRAVENOUS

## 2021-02-25 DIAGNOSIS — E663 Overweight: Secondary | ICD-10-CM | POA: Insufficient documentation

## 2021-02-25 NOTE — Progress Notes (Signed)
Cardiology Office Note   Date:  02/26/2021   ID:  [place], [place] 01-03-1951, MRN [id number]  PCP:  Alroy Dust, Carlean Jews.Marlou Sa, MD  Cardiologist:   Minus Breeding, MD  Chief Complaint  Patient presents with   Cardiomyopathy       History of Present Illness: [name] is a 70 y.o. female who presents for followup of atrial fibrillation and hypertrophic cardiomyopathy.  She has been followed in the atrial fib clinic.  She had ablation of her atrial fib by Dr. Curt Bears.  Since I last saw her she has done okay.  She brings me a blood pressure diary today and her blood pressures have been well controlled although the elevated today which is quite unusual.  She is going to continue to keep this diary but her pressures are systolic 315Q diastolic 00Q to 67Y at home.  She denies any new cardiovascular symptoms.  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  The patient denies any new symptoms such as chest discomfort, neck or arm discomfort. There has been no new shortness of breath, PND or orthopnea. There have been no reported palpitations, presyncope or syncope.    Past Medical History:  Diagnosis Date   Atrial fibrillation ([place])    Atrial flutter ([place])    Hypertension    Left ventricular hypertrophy    possibly hypertrophic cardiomyopathy   Obesity    Stroke [facility])     Past Surgical History:  Procedure Laterality Date   ABDOMINAL HYSTERECTOMY     ATRIAL FIBRILLATION ABLATION N/A 11/12/2018   Procedure: ATRIAL FIBRILLATION ABLATION;  Surgeon: Constance Haw, MD;  Location: [place] [facility];  Service: Cardiovascular;  Laterality: N/A;   TEE WITHOUT CARDIOVERSION N/A 11/11/2018   Procedure: TRANSESOPHAGEAL ECHOCARDIOGRAM (TEE);  Surgeon: Pixie Casino, MD;  Location: [facility] ENDOSCOPY;  Service: Cardiovascular;  Laterality: N/A;     Current Outpatient Medications   Medication Sig Dispense Refill   atorvastatin (LIPITOR) 40 MG tablet Take 1 tablet (40 mg total) by mouth daily at 6 PM. 90 tablet 3   calcium carbonate (OS-CAL) 1250 (500 Ca) MG chewable tablet Chew 2 tablets by mouth daily.     clotrimazole (LOTRIMIN) 1 % cream Apply 1 application topically 2 (two) times daily as needed (skin irritation).     dofetilide (TIKOSYN) 250 MCG capsule Take 1 capsule (250 mcg total) by mouth 2 (two) times daily. 180 capsule 0   hydrocortisone 1 % lotion Apply 1 application topically daily. For dry skin on face.      losartan (COZAAR) 100 MG tablet Take 1 tablet (100 mg total) by mouth daily. 90 tablet 2   magnesium oxide (MAG-OX) 400 MG tablet TAKE 2 TABLETS DAILY BY MOUTH. 180 tablet 3   metoprolol tartrate (LOPRESSOR) 25 MG tablet Take 1 tablet (25 mg total) by mouth 2 (two) times daily. 180 tablet 3   ondansetron (ZOFRAN-ODT) 8 MG disintegrating tablet TAKE 1 TABLET BY MOUTH TWICE A DAY AS NEEDED FOR NAUSEA     potassium chloride SA (KLOR-CON M20) 20 MEQ tablet Take 1 tablet (20 mEq total) by mouth daily. 90 tablet 2   XARELTO 20 MG TABS tablet TAKE 1 TABLET (20 MG TOTAL) BY MOUTH DAILY WITH SUPPER. 90 tablet 3   No current facility-administered medications for this visit.    Allergies:   Latex    ROS:  Please see the history of present illness.   Otherwise, review of systems are positive for none.   All other systems are reviewed and negative.    PHYSICAL EXAM: VS:  BP (!) 170/84 (BP Location: Left Arm, Patient Position: Sitting, Cuff Size: Normal)   Pulse 84   Ht 5\' 3"  (1.6 m)   Wt 185 lb 9.6 oz (84.2 kg)   BMI 32.88 kg/m  , BMI Body mass index is 32.88 kg/m. GENERAL:  Well appearing NECK:  No jugular venous distention, waveform within normal limits, carotid upstroke brisk and symmetric, no bruits, no thyromegaly LUNGS:  Clear to auscultation bilaterally CHEST:  Unremarkable HEART:  PMI not displaced or sustained,S1 and S2 within normal limits, no  S3, no S4, no clicks, no rubs, no murmurs ABD:  Flat, positive bowel sounds normal in frequency in pitch, no bruits, no rebound, no guarding, no midline pulsatile mass, no hepatomegaly, no splenomegaly EXT:  2 plus pulses throughout, no edema, no cyanosis no clubbing   EKG:  EKG is  ordered today. Sinus rhythm, rate 84, premature atrial contractions, left ventricular hypertrophy with repolarization changes, no change from previous.  Her ventricular contractions in a trigeminal pattern QT not prolonged.  Given first-degree AV block.   Recent Labs: No results found for requested labs within last 8760 hours.    Lipid Panel    Component Value Date/Time   CHOL 136 09/11/2017 0345   TRIG 53 09/11/2017 0345   HDL 37 (L) 09/11/2017 0345   CHOLHDL 3.7 09/11/2017 0345   VLDL 11 09/11/2017 0345   LDLCALC 88 09/11/2017 0345      Wt Readings from Last 3 Encounters:  02/26/21 185 lb 9.6 oz (84.2 kg)  05/25/20 186 lb 12.8 oz (84.7 kg)  11/30/19 189 lb 3.2 oz (85.8 kg)      Other studies Reviewed: Additional studies/ records that were reviewed today include: Echo, labs. Review of the above records demonstrates:  Please see elsewhere in the note.     ASSESSMENT AND PLAN:  ATRIAL FIB -   [name] has a CHA2DS2 - VASc score of 2.   Tolerates anticoagulation.  She has not had any recurrent symptomatic atrial fibrillation ..  I will check a mag today and potassium today.   HYPERTENSION, BENIGN -  Her blood pressure is controlled.  She will get a diary.   HYPERTROPHIC CARDIOMYOPATHY  She has moderate asymmetric LVH of the apical segement.    She will need a cardiac MRI.  I talked about this and the is possible testing going forward.  She has a brother with a pacemaker but no other history of sudden.  She might need further stratification based on the results of the MRI     Current medicines are reviewed at length with the patient today.  The patient does not have concerns  regarding medicines.  The following changes have been made:  None  Labs/ tests ordered today include:   Orders Placed This Encounter  Procedures   MR CARDIAC MORPHOLOGY W WO CONTRAST   Magnesium   EKG 12-Lead      Disposition:   FU with me 12 months.   Signed, Minus Breeding, MD  02/26/2021 11:20 AM    [facility]

## 2021-02-26 ENCOUNTER — Encounter: Payer: Self-pay | Admitting: Cardiology

## 2021-02-26 ENCOUNTER — Other Ambulatory Visit: Payer: Self-pay

## 2021-02-26 ENCOUNTER — Ambulatory Visit: Payer: Medicare HMO | Admitting: Cardiology

## 2021-02-26 ENCOUNTER — Other Ambulatory Visit: Payer: Self-pay | Admitting: Cardiology

## 2021-02-26 VITALS — BP 170/84 | HR 84 | Ht 63.0 in | Wt 185.6 lb

## 2021-02-26 DIAGNOSIS — I480 Paroxysmal atrial fibrillation: Secondary | ICD-10-CM

## 2021-02-26 DIAGNOSIS — I10 Essential (primary) hypertension: Secondary | ICD-10-CM

## 2021-02-26 DIAGNOSIS — I421 Obstructive hypertrophic cardiomyopathy: Secondary | ICD-10-CM

## 2021-02-26 DIAGNOSIS — E663 Overweight: Secondary | ICD-10-CM

## 2021-02-26 LAB — MAGNESIUM: Magnesium: 2.1 mg/dL (ref 1.6–2.3)

## 2021-02-26 NOTE — Patient Instructions (Signed)
Medication Instructions:  The current medical regimen is effective;  continue present plan and medications.  *If you need a refill on your cardiac medications before your next appointment, please call your pharmacy*   Lab Work: MAG today   If you have labs (blood work) drawn today and your tests are completely normal, you will receive your results only by: [place] (if you have MyChart) OR A paper copy in the mail If you have any lab test that is abnormal or we need to change your treatment, we will call you to review the results.   Testing/Procedures: Your physician has requested that you have a cardiac MRI. Cardiac MRI uses a computer to create images of your heart as its beating, producing both still and moving pictures of your heart and major blood vessels. For further information please visit [email or web address] Please follow the instruction sheet given to you today for more information.    Follow-Up: At [facility], you and your health needs are our priority.  As part of our continuing mission to provide you with exceptional heart care, we have created designated Provider Care Teams.  These Care Teams include your primary Cardiologist (physician) and Advanced Practice Providers (APPs -  Physician Assistants and Nurse Practitioners) who all work together to provide you with the care you need, when you need it.  We recommend signing up for the patient portal called "MyChart".  Sign up information is provided on this After Visit Summary.  MyChart is used to connect with patients for Virtual Visits (Telemedicine).  Patients are able to view lab/test results, encounter notes, upcoming appointments, etc.  Non-urgent messages can be sent to your provider as well.   To learn more about what you can do with MyChart, go to [email or web address].    Your next appointment:   12 month(s)  The format for your next appointment:   In Person  Provider:   Minus Breeding, MD

## 2021-03-27 ENCOUNTER — Other Ambulatory Visit: Payer: Self-pay

## 2021-03-27 ENCOUNTER — Telehealth: Payer: Self-pay

## 2021-03-27 DIAGNOSIS — Z01812 Encounter for preprocedural laboratory examination: Secondary | ICD-10-CM

## 2021-03-27 DIAGNOSIS — I480 Paroxysmal atrial fibrillation: Secondary | ICD-10-CM

## 2021-03-27 DIAGNOSIS — I421 Obstructive hypertrophic cardiomyopathy: Secondary | ICD-10-CM

## 2021-03-27 NOTE — Telephone Encounter (Signed)
Spoke to patient advised she will need a cbc before cardiac mri scheduled this Friday 7/15.Stated she will have cbc done tomorrow 7/13.Order placed.

## 2021-03-28 DIAGNOSIS — I480 Paroxysmal atrial fibrillation: Secondary | ICD-10-CM | POA: Diagnosis not present

## 2021-03-28 DIAGNOSIS — I421 Obstructive hypertrophic cardiomyopathy: Secondary | ICD-10-CM | POA: Diagnosis not present

## 2021-03-28 DIAGNOSIS — Z01812 Encounter for preprocedural laboratory examination: Secondary | ICD-10-CM | POA: Diagnosis not present

## 2021-03-28 LAB — CBC WITH DIFFERENTIAL/PLATELET
Basophils Absolute: 0 10*3/uL (ref 0.0–0.2)
Basos: 1 %
EOS (ABSOLUTE): 0.1 10*3/uL (ref 0.0–0.4)
Eos: 2 %
Hematocrit: 42.8 % (ref 34.0–46.6)
Hemoglobin: 13.2 g/dL (ref 11.1–15.9)
Immature Grans (Abs): 0 10*3/uL (ref 0.0–0.1)
Immature Granulocytes: 0 %
Lymphocytes Absolute: 1.2 10*3/uL (ref 0.7–3.1)
Lymphs: 26 %
MCH: 24.4 pg — ABNORMAL LOW (ref 26.6–33.0)
MCHC: 30.8 g/dL — ABNORMAL LOW (ref 31.5–35.7)
MCV: 79 fL (ref 79–97)
Monocytes Absolute: 0.6 10*3/uL (ref 0.1–0.9)
Monocytes: 12 %
Neutrophils Absolute: 2.7 10*3/uL (ref 1.4–7.0)
Neutrophils: 59 %
Platelets: 220 10*3/uL (ref 150–450)
RBC: 5.41 x10E6/uL — ABNORMAL HIGH (ref 3.77–5.28)
RDW: 13.6 % (ref 11.7–15.4)
WBC: 4.7 10*3/uL (ref 3.4–10.8)

## 2021-03-29 ENCOUNTER — Telehealth: Payer: Self-pay | Admitting: Cardiology

## 2021-03-29 ENCOUNTER — Telehealth (HOSPITAL_COMMUNITY): Payer: Self-pay | Admitting: Emergency Medicine

## 2021-03-29 NOTE — Telephone Encounter (Signed)
Appointment has been cancelled per chart review

## 2021-03-29 NOTE — Telephone Encounter (Signed)
Patient scheduled for cardiac MRI tomorrow Bethany Cruz RN attempted to call patient today -- will route to her as patient wants to cancel  Will make MD aware  Unsure if billing was able to communicate the cost/co-pay(if any) to patient -- will route to this team

## 2021-03-29 NOTE — Telephone Encounter (Signed)
Patient was [name] that she would have to pay for appt on tomorrow 7/15. So she will be canceling the appt because she is unable to pay for it. Please advise

## 2021-03-29 NOTE — Telephone Encounter (Signed)
Attempted to call patient regarding upcoming cardiac MR appointment. Left message on voicemail with name and callback number Dorinda Stehr RN Navigator Cardiac Imaging [facility] [phone] Office [phone] Cell  

## 2021-03-30 ENCOUNTER — Ambulatory Visit (HOSPITAL_COMMUNITY): Admission: RE | Admit: 2021-03-30 | Payer: Medicare HMO | Source: Ambulatory Visit

## 2021-04-04 ENCOUNTER — Encounter: Payer: Self-pay | Admitting: *Deleted

## 2021-04-05 DIAGNOSIS — Z0100 Encounter for examination of eyes and vision without abnormal findings: Secondary | ICD-10-CM | POA: Diagnosis not present

## 2021-04-16 ENCOUNTER — Other Ambulatory Visit: Payer: Self-pay | Admitting: Cardiology

## 2021-04-16 ENCOUNTER — Telehealth: Payer: Self-pay | Admitting: Cardiology

## 2021-04-16 NOTE — Telephone Encounter (Signed)
*  STAT* If patient is at the pharmacy, call can be transferred to refill team.   1. Which medications need to be refilled? (please list name of each medication and dose if known) losartan (COZAAR) 100 MG tablet  2. Which pharmacy/location (including street and city if local pharmacy) is medication to be sent to? [id number] [place] [place] [address] [place] 3. Do they need a 30 day or 90 day supply? 90 day supply  Pt is out of this medication

## 2021-04-30 ENCOUNTER — Other Ambulatory Visit: Payer: Self-pay | Admitting: Cardiology

## 2021-05-11 ENCOUNTER — Other Ambulatory Visit: Payer: Self-pay | Admitting: Cardiology

## 2021-05-23 DIAGNOSIS — U071 COVID-19: Secondary | ICD-10-CM | POA: Diagnosis not present

## 2021-05-24 ENCOUNTER — Other Ambulatory Visit: Payer: Self-pay | Admitting: Cardiology

## 2021-05-28 ENCOUNTER — Other Ambulatory Visit: Payer: Self-pay | Admitting: Family Medicine

## 2021-05-28 DIAGNOSIS — Z1231 Encounter for screening mammogram for malignant neoplasm of breast: Secondary | ICD-10-CM

## 2021-05-31 ENCOUNTER — Ambulatory Visit: Payer: Medicare HMO

## 2021-06-11 ENCOUNTER — Telehealth: Payer: Self-pay | Admitting: Cardiology

## 2021-06-11 MED ORDER — ATORVASTATIN CALCIUM 40 MG PO TABS: 40.0000 mg | ORAL_TABLET | Freq: Every day | ORAL | 3 refills | Status: DC

## 2021-06-11 NOTE — Telephone Encounter (Signed)
*  STAT* If patient is at the pharmacy, call can be transferred to refill team.   1. Which medications need to be refilled? (please list name of each medication and dose if known) atorvastatin (LIPITOR) 40 MG tablet  2. Which pharmacy/location (including street and city if local pharmacy) is medication to be sent to? [id number] [place], [place] - [place]  3. Do they need a 30 day or 90 day supply?  90 day supply

## 2021-06-12 ENCOUNTER — Other Ambulatory Visit (HOSPITAL_BASED_OUTPATIENT_CLINIC_OR_DEPARTMENT_OTHER): Payer: Self-pay

## 2021-06-12 ENCOUNTER — Other Ambulatory Visit: Payer: Self-pay | Admitting: Cardiology

## 2021-06-14 DIAGNOSIS — R7303 Prediabetes: Secondary | ICD-10-CM | POA: Diagnosis not present

## 2021-06-14 DIAGNOSIS — I10 Essential (primary) hypertension: Secondary | ICD-10-CM | POA: Diagnosis not present

## 2021-06-14 DIAGNOSIS — Z0000 Encounter for general adult medical examination without abnormal findings: Secondary | ICD-10-CM | POA: Diagnosis not present

## 2021-06-14 DIAGNOSIS — E669 Obesity, unspecified: Secondary | ICD-10-CM | POA: Diagnosis not present

## 2021-06-14 DIAGNOSIS — Z23 Encounter for immunization: Secondary | ICD-10-CM | POA: Diagnosis not present

## 2021-06-14 DIAGNOSIS — Z8673 Personal history of transient ischemic attack (TIA), and cerebral infarction without residual deficits: Secondary | ICD-10-CM | POA: Diagnosis not present

## 2021-06-14 DIAGNOSIS — I480 Paroxysmal atrial fibrillation: Secondary | ICD-10-CM | POA: Diagnosis not present

## 2021-06-14 DIAGNOSIS — D6869 Other thrombophilia: Secondary | ICD-10-CM | POA: Diagnosis not present

## 2021-06-29 DIAGNOSIS — Z0100 Encounter for examination of eyes and vision without abnormal findings: Secondary | ICD-10-CM | POA: Diagnosis not present

## 2021-07-18 ENCOUNTER — Other Ambulatory Visit: Payer: Self-pay

## 2021-07-18 ENCOUNTER — Ambulatory Visit
Admission: RE | Admit: 2021-07-18 | Discharge: 2021-07-18 | Disposition: A | Discharge status: Home / Self Care | Payer: Medicare HMO | Source: Ambulatory Visit | Attending: Family Medicine | Admitting: Family Medicine

## 2021-07-18 DIAGNOSIS — Z1231 Encounter for screening mammogram for malignant neoplasm of breast: Secondary | ICD-10-CM | POA: Diagnosis not present

## 2021-08-07 ENCOUNTER — Other Ambulatory Visit: Payer: Self-pay | Admitting: Cardiology

## 2021-08-07 NOTE — Telephone Encounter (Signed)
Prescription refill request for Xarelto received.  Indication: afib  Last office visit: [place], 02/26/2021 Weight:84.2 kg  Age: 70 yo  Scr: 0.94, 06/14/2021 CrCl: 75 ml/min   Refill sent.

## 2021-10-09 DIAGNOSIS — Z23 Encounter for immunization: Secondary | ICD-10-CM | POA: Diagnosis not present

## 2021-11-19 ENCOUNTER — Other Ambulatory Visit: Payer: Self-pay | Admitting: Cardiology

## 2021-12-13 DIAGNOSIS — I4891 Unspecified atrial fibrillation: Secondary | ICD-10-CM | POA: Diagnosis not present

## 2021-12-13 DIAGNOSIS — Z8673 Personal history of transient ischemic attack (TIA), and cerebral infarction without residual deficits: Secondary | ICD-10-CM | POA: Diagnosis not present

## 2021-12-13 DIAGNOSIS — I10 Essential (primary) hypertension: Secondary | ICD-10-CM | POA: Diagnosis not present

## 2021-12-13 DIAGNOSIS — M25561 Pain in right knee: Secondary | ICD-10-CM | POA: Diagnosis not present

## 2021-12-13 DIAGNOSIS — E1169 Type 2 diabetes mellitus with other specified complication: Secondary | ICD-10-CM | POA: Diagnosis not present

## 2021-12-13 DIAGNOSIS — E669 Obesity, unspecified: Secondary | ICD-10-CM | POA: Diagnosis not present

## 2021-12-13 DIAGNOSIS — Z6833 Body mass index (BMI) 33.0-33.9, adult: Secondary | ICD-10-CM | POA: Diagnosis not present

## 2022-01-04 ENCOUNTER — Telehealth: Payer: Self-pay | Admitting: Cardiology

## 2022-01-04 NOTE — Telephone Encounter (Signed)
?*  STAT* If patient is at the pharmacy, call can be transferred to refill team. ? ? ?1. Which medications need to be refilled? (please list name of each medication and dose if known)  ?losartan (COZAAR) 100 MG tablet ? ?2. Which pharmacy/location (including street and city if local pharmacy) is medication to be sent to? ?[id number] [place] [place] [address]? ?3. Do they need a 30 day or 90 day supply? 90 with refills ?

## 2022-01-07 ENCOUNTER — Other Ambulatory Visit: Payer: Self-pay

## 2022-01-07 MED ORDER — LOSARTAN POTASSIUM 100 MG PO TABS: 100.0000 mg | ORAL_TABLET | Freq: Every day | ORAL | 2 refills | Status: DC

## 2022-01-07 NOTE — Telephone Encounter (Signed)
Called patient to advise medication refill sent to pharmacy. ?

## 2022-01-24 ENCOUNTER — Telehealth: Payer: Self-pay | Admitting: Cardiology

## 2022-01-24 NOTE — Telephone Encounter (Signed)
?*  STAT* If patient is at the pharmacy, call can be transferred to refill team. ? ? ?1. Which medications need to be refilled? (please list name of each medication and dose if known)  ?rivaroxaban (XARELTO) 20 MG TABS tablet ? ?2. Which pharmacy/location (including street and city if local pharmacy) is medication to be sent to? ?[id number] [place], [place] [address]? ?3. Do they need a 30 day or 90 day supply? 90 with refills  ?

## 2022-01-24 NOTE — Telephone Encounter (Signed)
Prescription refill request for Xarelto received.  ?Indication:Afib ?Last office visit:6/22 ?Weight:84.2 kg ?Age:71 ?Scr:0.8 ?CrCl:88.22 ml/min ? ?Prescription refilled ? ?

## 2022-01-30 ENCOUNTER — Other Ambulatory Visit: Payer: Self-pay | Admitting: Cardiology

## 2022-01-30 NOTE — Telephone Encounter (Signed)
Prescription refill request for Xarelto received.  ?Indication:Afib ?Last office visit:6/22 ?Weight:84.2 kg ?Age:71 ?Scr:0.8 ?CrCl:88.22 ml/min ? ?Prescription refilled ? ?

## 2022-02-05 ENCOUNTER — Telehealth: Payer: Self-pay | Admitting: Cardiology

## 2022-02-05 NOTE — Telephone Encounter (Signed)
*  STAT* If patient is at the pharmacy, call can be transferred to refill team.   1. Which medications need to be refilled? (please list name of each medication and dose if known) dofetilide (TIKOSYN) 250 MCG capsule  2. Which pharmacy/location (including street and city if local pharmacy) is medication to be sent to? [id number] [place] [place] [facility] [address]  3. Do they need a 30 day or 90 day supply? [address]

## 2022-02-07 ENCOUNTER — Other Ambulatory Visit: Payer: Self-pay | Admitting: Cardiology

## 2022-02-14 ENCOUNTER — Other Ambulatory Visit: Payer: Self-pay | Admitting: Cardiology

## 2022-02-24 NOTE — Progress Notes (Unsigned)
Cardiology Office Note   Date:  02/25/2022   ID:  [place], [place] April 08, 1951, MRN [id number]  PCP:  Alroy Dust, Carlean Jews.Marlou Sa, MD  Cardiologist:   Minus Breeding, MD  Chief Complaint  Patient presents with   Atrial Fibrillation       History of Present Illness: [name] is a 71 y.o. female who presents for followup of atrial fibrillation and hypertrophic cardiomyopathy.  She has been followed in the atrial fib clinic.  She had ablation of her atrial fib by Dr. Curt Bears.  Since I last saw her she has done well.  She is exercising 1 to 1-1/2 hours 3 times a week sometimes before.  The patient denies any new symptoms such as chest discomfort, neck or arm discomfort. There has been no new shortness of breath, PND or orthopnea. There have been no reported palpitations, presyncope or syncope.  In retrospect now that today she knows she is back in atrial flutter she says she does not feel the palpitations.  Of note she was to have an MRI but she did not want to do this because of cost.     Past Medical History:  Diagnosis Date   Atrial fibrillation (HCC)    Atrial flutter ([place])    Hypertension    Left ventricular hypertrophy    possibly hypertrophic cardiomyopathy   Obesity    Stroke [facility])     Past Surgical History:  Procedure Laterality Date   ABDOMINAL HYSTERECTOMY     ATRIAL FIBRILLATION ABLATION N/A 11/12/2018   Procedure: ATRIAL FIBRILLATION ABLATION;  Surgeon: Constance Haw, MD;  Location: [facility];  Service: Cardiovascular;  Laterality: N/A;   TEE WITHOUT CARDIOVERSION N/A 11/11/2018   Procedure: TRANSESOPHAGEAL ECHOCARDIOGRAM (TEE);  Surgeon: Pixie Casino, MD;  Location: [facility] ENDOSCOPY;  Service: Cardiovascular;  Laterality: N/A;     Current Outpatient Medications  Medication Sig Dispense Refill   atorvastatin (LIPITOR) 40 MG tablet TAKE 1 TABLET (40 MG TOTAL) BY MOUTH DAILY AT 6 PM. 90 tablet 3   bisacodyl (DULCOLAX) 5 MG EC  tablet as needed.     clotrimazole (LOTRIMIN) 1 % cream Apply 1 application topically 2 (two) times daily as needed (skin irritation).     dofetilide (TIKOSYN) 250 MCG capsule TAKE 1 CAPSULE BY MOUTH TWICE A DAY 180 capsule 2   hydrocortisone 1 % lotion Apply 1 application topically daily. For dry skin on face.      losartan (COZAAR) 100 MG tablet Take 1 tablet (100 mg total) by mouth daily. 90 tablet 2   magnesium oxide (MAG-OX) 400 (240 Mg) MG tablet TAKE 2 TABLETS BY MOUTH EVERY DAY 180 tablet 3   metoprolol tartrate (LOPRESSOR) 25 MG tablet TAKE 1 TABLET BY MOUTH TWICE A DAY 180 tablet 3   potassium chloride SA (KLOR-CON M20) 20 MEQ tablet TAKE 1 TABLET BY MOUTH EVERY DAY 90 tablet 1   XARELTO 20 MG TABS tablet TAKE 1 TABLET BY MOUTH DAILY WITH SUPPER 90 tablet 1   No current facility-administered medications for this visit.    Allergies:   Penicillin g and Latex    ROS:  Please see the history of present illness.   Otherwise, review of systems are positive for none.    All other systems are reviewed and negative.    PHYSICAL EXAM: VS:  BP 140/88   Pulse 76   Ht '5\' 3"'$  (1.6 m)   Wt 189 lb 9.6 oz (86 kg)  SpO2 96%   BMI 33.59 kg/m  , BMI Body mass index is 33.59 kg/m. GENERAL:  Well appearing NECK:  No jugular venous distention, waveform within normal limits, carotid upstroke brisk and symmetric, no bruits, no thyromegaly LUNGS:  Clear to auscultation bilaterally CHEST:  Unremarkable HEART:  PMI not displaced or sustained,S1 and S2 within normal limits, no S3,  no clicks, no rubs, no murmurs, irregular ABD:  Flat, positive bowel sounds normal in frequency in pitch, no bruits, no rebound, no guarding, no midline pulsatile mass, no hepatomegaly, no splenomegaly EXT:  2 plus pulses throughout, no edema, no cyanosis no clubbing   EKG:  EKG is  ordered today. Atrial flutter with variable conduction with controlled ventricular rate, left ventricular hypertrophy with repolarization  patterns, QT prolongation   Recent Labs: 02/26/2021: Magnesium 2.1 03/28/2021: Hemoglobin 13.2; Platelets 220    Lipid Panel    Component Value Date/Time   CHOL 136 09/11/2017 0345   TRIG 53 09/11/2017 0345   HDL 37 (L) 09/11/2017 0345   CHOLHDL 3.7 09/11/2017 0345   VLDL 11 09/11/2017 0345   LDLCALC 88 09/11/2017 0345      Wt Readings from Last 3 Encounters:  02/25/22 189 lb 9.6 oz (86 kg)  02/26/21 185 lb 9.6 oz (84.2 kg)  05/25/20 186 lb 12.8 oz (84.7 kg)      Other studies Reviewed: Additional studies/ records that were reviewed today include: BP diary Review of the above records demonstrates:  Please see elsewhere in the note.     ASSESSMENT AND PLAN:  ATRIAL FIB -   [name] has a CHA2DS2 - VASc score of 3.  She really feels rare palpitations and otherwise does not have any problems with this rhythm.  She tolerates anticoagulation.  No change in therapy.   We are going to continue with rate control and anticoagulation rather than further attempts at rhythm control because she is really not feeling this.  HYPERTENSION, BENIGN -  Her blood pressure is controlled.  She will continue the meds as listed.  I did review her blood pressure diary.   HYPERTROPHIC CARDIOMYOPATHY  She has moderate asymmetric LVH of the apical segement.   She was to get an MRI.  However, she did not want to have this because of cost.  We had a long discussion about risk stratification with hypertrophic cardiomyopathy.  We talked about the genetics.  She knows her daughters had an echocardiogram because she was hospitalized with atrial fibrillation and COVID.  She at this time does not want to do further testing again because of cost.  Current medicines are reviewed at length with the patient today.  The patient does not have concerns regarding medicines.  The following changes have been made: None  Labs/ tests ordered today include: None  Orders Placed This Encounter  Procedures    EKG 12-Lead      Disposition:   FU with me 12 months.   Signed, Minus Breeding, MD  02/25/2022 11:04 AM    [place]

## 2022-02-25 ENCOUNTER — Ambulatory Visit (INDEPENDENT_AMBULATORY_CARE_PROVIDER_SITE_OTHER): Payer: Medicare HMO | Admitting: Cardiology

## 2022-02-25 ENCOUNTER — Encounter: Payer: Self-pay | Admitting: Cardiology

## 2022-02-25 VITALS — BP 140/88 | HR 76 | Ht 63.0 in | Wt 189.6 lb

## 2022-02-25 DIAGNOSIS — I10 Essential (primary) hypertension: Secondary | ICD-10-CM | POA: Diagnosis not present

## 2022-02-25 DIAGNOSIS — I480 Paroxysmal atrial fibrillation: Secondary | ICD-10-CM | POA: Diagnosis not present

## 2022-02-25 DIAGNOSIS — I422 Other hypertrophic cardiomyopathy: Secondary | ICD-10-CM | POA: Diagnosis not present

## 2022-02-25 NOTE — Patient Instructions (Signed)

## 2022-05-15 ENCOUNTER — Telehealth: Payer: Self-pay | Admitting: Cardiology

## 2022-05-15 MED ORDER — ATORVASTATIN CALCIUM 40 MG PO TABS: 40.0000 mg | ORAL_TABLET | Freq: Every day | ORAL | 3 refills | Status: DC

## 2022-05-15 MED ORDER — MAGNESIUM OXIDE -MG SUPPLEMENT 400 (240 MG) MG PO TABS: ORAL_TABLET | ORAL | 3 refills | Status: DC

## 2022-05-15 MED ORDER — METOPROLOL TARTRATE 25 MG PO TABS: 25.0000 mg | ORAL_TABLET | Freq: Two times a day (BID) | ORAL | 3 refills | Status: DC

## 2022-05-15 NOTE — Telephone Encounter (Signed)
*  STAT* If patient is at the pharmacy, call can be transferred to refill team.   1. Which medications need to be refilled? (please list name of each medication and dose if known) metoprolol tartrate (LOPRESSOR) 25 MG tablet  atorvastatin (LIPITOR) 40 MG tablet  magnesium oxide (MAG-OX) 400 (240 Mg) MG tablet   2. Which pharmacy/location (including street and city if local pharmacy) is medication to be sent to?    [id number] [place] [place] [facility] [address] 3. Do they need a 30 day or 90 day supply? [address]

## 2022-06-04 ENCOUNTER — Other Ambulatory Visit: Payer: Self-pay | Admitting: Family Medicine

## 2022-06-04 ENCOUNTER — Other Ambulatory Visit: Payer: Self-pay | Admitting: Internal Medicine

## 2022-06-04 DIAGNOSIS — Z1231 Encounter for screening mammogram for malignant neoplasm of breast: Secondary | ICD-10-CM

## 2022-06-26 DIAGNOSIS — M8589 Other specified disorders of bone density and structure, multiple sites: Secondary | ICD-10-CM | POA: Diagnosis not present

## 2022-06-26 DIAGNOSIS — E1169 Type 2 diabetes mellitus with other specified complication: Secondary | ICD-10-CM | POA: Diagnosis not present

## 2022-06-26 DIAGNOSIS — I422 Other hypertrophic cardiomyopathy: Secondary | ICD-10-CM | POA: Diagnosis not present

## 2022-06-26 DIAGNOSIS — Z8673 Personal history of transient ischemic attack (TIA), and cerebral infarction without residual deficits: Secondary | ICD-10-CM | POA: Diagnosis not present

## 2022-06-26 DIAGNOSIS — Z0000 Encounter for general adult medical examination without abnormal findings: Secondary | ICD-10-CM | POA: Diagnosis not present

## 2022-06-26 DIAGNOSIS — I10 Essential (primary) hypertension: Secondary | ICD-10-CM | POA: Diagnosis not present

## 2022-06-26 DIAGNOSIS — E669 Obesity, unspecified: Secondary | ICD-10-CM | POA: Diagnosis not present

## 2022-06-26 DIAGNOSIS — D6869 Other thrombophilia: Secondary | ICD-10-CM | POA: Diagnosis not present

## 2022-06-26 DIAGNOSIS — Z13820 Encounter for screening for osteoporosis: Secondary | ICD-10-CM | POA: Diagnosis not present

## 2022-06-26 DIAGNOSIS — E7800 Pure hypercholesterolemia, unspecified: Secondary | ICD-10-CM | POA: Diagnosis not present

## 2022-06-26 DIAGNOSIS — Z23 Encounter for immunization: Secondary | ICD-10-CM | POA: Diagnosis not present

## 2022-06-26 DIAGNOSIS — I480 Paroxysmal atrial fibrillation: Secondary | ICD-10-CM | POA: Diagnosis not present

## 2022-06-28 ENCOUNTER — Other Ambulatory Visit: Payer: Self-pay | Admitting: Internal Medicine

## 2022-06-28 DIAGNOSIS — M8589 Other specified disorders of bone density and structure, multiple sites: Secondary | ICD-10-CM

## 2022-07-01 ENCOUNTER — Other Ambulatory Visit: Payer: Self-pay

## 2022-07-01 ENCOUNTER — Telehealth: Payer: Self-pay | Admitting: Cardiology

## 2022-07-01 MED ORDER — RIVAROXABAN 20 MG PO TABS: 20.0000 mg | ORAL_TABLET | Freq: Every day | ORAL | 1 refills | Status: DC

## 2022-07-01 NOTE — Telephone Encounter (Signed)
*  STAT* If patient is at the pharmacy, call can be transferred to refill team.   1. Which medications need to be refilled? (please list name of each medication and dose if known) XARELTO 20 MG TABS tablet  2. Which pharmacy/location (including street and city if local pharmacy) is medication to be sent to?  [id number] [place], [place] - [address] 3. Do they need a 30 day or 90 day supply? 90 day supply

## 2022-07-01 NOTE — Telephone Encounter (Signed)
Prescription refill request for Xarelto received.  Indication:Afib Last office visit:6/23 Weight:86 kg Age:71 Scr:0.8 CrCl:88.84 ml/min  Prescription refilled

## 2022-07-19 ENCOUNTER — Ambulatory Visit
Admission: RE | Admit: 2022-07-19 | Discharge: 2022-07-19 | Disposition: A | Discharge status: Home / Self Care | Payer: Medicare HMO | Source: Ambulatory Visit | Attending: Internal Medicine | Admitting: Internal Medicine

## 2022-07-19 DIAGNOSIS — Z1231 Encounter for screening mammogram for malignant neoplasm of breast: Secondary | ICD-10-CM

## 2022-07-28 DIAGNOSIS — H524 Presbyopia: Secondary | ICD-10-CM | POA: Diagnosis not present

## 2022-08-13 ENCOUNTER — Other Ambulatory Visit: Payer: Self-pay | Admitting: Cardiology

## 2022-09-18 ENCOUNTER — Telehealth: Payer: Self-pay | Admitting: Cardiology

## 2022-09-18 ENCOUNTER — Telehealth: Payer: Self-pay

## 2022-09-18 MED ORDER — LOSARTAN POTASSIUM 100 MG PO TABS: 100.0000 mg | ORAL_TABLET | Freq: Every day | ORAL | 2 refills | Status: DC

## 2022-09-18 NOTE — Telephone Encounter (Signed)
 *  STAT* If patient is at the pharmacy, call can be transferred to refill team.   1. Which medications need to be refilled? (please list name of each medication and dose if known) losartan (COZAAR) 100 MG tablet   2. Which pharmacy/location (including street and city if local pharmacy) is medication to be sent to?  [id number] [place], [place] [facility] [address]   3. Do they need a 30 day or 90 day supply? 90 days

## 2022-09-18 NOTE — Patient Outreach (Signed)
  Care Coordination   09/18/2022 Name: [name] MRN: [id number] DOB: Jan 11, 1951   Care Coordination Outreach Attempts:  An unsuccessful telephone outreach was attempted today to offer the patient information about available care coordination services as a benefit of their health plan.   Follow Up Plan:  Additional outreach attempts will be made to offer the patient care coordination information and services.   Encounter Outcome:  No Answer   Care Coordination Interventions:  No, not indicated    Peter Garter RN, BSN,CCM, CDE Care Management Coordinator [place] [phone]

## 2022-09-25 ENCOUNTER — Telehealth: Payer: Self-pay

## 2022-09-25 NOTE — Patient Outreach (Signed)
  Care Coordination   09/25/2022 Name: Christianna Belmonte MRN: [id number] DOB: 07-22-1951   Care Coordination Outreach Attempts:  A second unsuccessful outreach was attempted today to offer the patient with information about available care coordination services as a benefit of their health plan.     Follow Up Plan:  Additional outreach attempts will be made to offer the patient care coordination information and services.   Encounter Outcome:  No Answer   Care Coordination Interventions:  No, not indicated    Peter Garter RN, BSN,CCM, CDE Care Management Coordinator [place] [facility] [phone]

## 2022-10-01 ENCOUNTER — Telehealth: Payer: Self-pay

## 2022-10-01 NOTE — Patient Outreach (Signed)
  Care Coordination   10/01/2022 Name: Tyra Gural MRN: [id number] DOB: 01-01-1951   Care Coordination Outreach Attempts:  A third unsuccessful outreach was attempted today to offer the patient with information about available care coordination services as a benefit of their health plan.   Follow Up Plan:  No further outreach attempts will be made at this time. We have been unable to contact the patient to offer or enroll patient in care coordination services  Encounter Outcome:  No Answer   Care Coordination Interventions:  No, not indicated    Peter Garter RN, BSN,CCM, [place] [facility] [phone]

## 2022-10-31 ENCOUNTER — Telehealth: Payer: Self-pay | Admitting: Cardiology

## 2022-10-31 NOTE — Telephone Encounter (Signed)
*  STAT* If patient is at the pharmacy, call can be transferred to refill team.   1. Which medications need to be refilled? (please list name of each medication and dose if known) Dofetilide  2. Which pharmacy/location (including street and city if local pharmacy) is medication to be sent to? [place], [place]  3. Do they need a 30 day or 90 day supply? 60 #180 and refills

## 2022-11-04 ENCOUNTER — Other Ambulatory Visit: Payer: Self-pay

## 2022-11-04 MED ORDER — DOFETILIDE 250 MCG PO CAPS: 250.0000 ug | ORAL_CAPSULE | Freq: Two times a day (BID) | ORAL | 1 refills | Status: DC

## 2022-11-06 NOTE — Telephone Encounter (Signed)
*  STAT* If patient is at the pharmacy, call can be transferred to refill team.   1. Which medications need to be refilled? (please list name of each medication and dose if known) Dofetilide  2. Which pharmacy/location (including street and city if local pharmacy) is medication to be sent to? [facility] [address] [place]  3. Do they need a 30 day or 90 day supply? 90 day #180 and refills

## 2022-12-20 ENCOUNTER — Ambulatory Visit
Admission: RE | Admit: 2022-12-20 | Discharge: 2022-12-20 | Disposition: A | Discharge status: Home / Self Care | Payer: Medicare HMO | Source: Ambulatory Visit | Attending: Internal Medicine | Admitting: Internal Medicine

## 2022-12-20 DIAGNOSIS — M8589 Other specified disorders of bone density and structure, multiple sites: Secondary | ICD-10-CM | POA: Diagnosis not present

## 2022-12-20 DIAGNOSIS — Z780 Asymptomatic menopausal state: Secondary | ICD-10-CM | POA: Diagnosis not present

## 2022-12-26 DIAGNOSIS — E7800 Pure hypercholesterolemia, unspecified: Secondary | ICD-10-CM | POA: Diagnosis not present

## 2022-12-26 DIAGNOSIS — I10 Essential (primary) hypertension: Secondary | ICD-10-CM | POA: Diagnosis not present

## 2022-12-26 DIAGNOSIS — E1165 Type 2 diabetes mellitus with hyperglycemia: Secondary | ICD-10-CM | POA: Diagnosis not present

## 2022-12-26 DIAGNOSIS — M8588 Other specified disorders of bone density and structure, other site: Secondary | ICD-10-CM | POA: Diagnosis not present

## 2022-12-26 DIAGNOSIS — E1169 Type 2 diabetes mellitus with other specified complication: Secondary | ICD-10-CM | POA: Diagnosis not present

## 2022-12-26 DIAGNOSIS — D6869 Other thrombophilia: Secondary | ICD-10-CM | POA: Diagnosis not present

## 2022-12-26 DIAGNOSIS — I480 Paroxysmal atrial fibrillation: Secondary | ICD-10-CM | POA: Diagnosis not present

## 2022-12-26 DIAGNOSIS — I422 Other hypertrophic cardiomyopathy: Secondary | ICD-10-CM | POA: Diagnosis not present

## 2022-12-26 DIAGNOSIS — R600 Localized edema: Secondary | ICD-10-CM | POA: Diagnosis not present

## 2022-12-26 DIAGNOSIS — E559 Vitamin D deficiency, unspecified: Secondary | ICD-10-CM | POA: Diagnosis not present

## 2023-01-17 DIAGNOSIS — Z0100 Encounter for examination of eyes and vision without abnormal findings: Secondary | ICD-10-CM | POA: Diagnosis not present

## 2023-03-01 NOTE — Progress Notes (Unsigned)
  Cardiology Office Note:   Date:  03/03/2023  ID:  [id number] [place], [place] Dec 25, 1950, MRN [id number] PCP: Ollen Bowl, MD   HeartCare Providers Cardiologist:  Rollene Rotunda, MD Electrophysiologist:  Will Jorja Loa, MD    History of Present Illness:   [name] is a 72 y.o. female who presents for followup of atrial fibrillation and hypertrophic cardiomyopathy.  She has been followed in the atrial fib clinic.  She had ablation of her atrial fib by Dr. Elberta Fortis.  Since I last saw her she has been still exercising 3 times a week and from the TV with vigorous exercise.  She rarely feels palpitations.  She does not have any presyncope or syncope. The patient denies any new symptoms such as chest discomfort, neck or arm discomfort. There has been no new shortness of breath, PND or orthopnea. There have been no reported palpitations, presyncope or syncope.   ROS: As stated in the HPI and negative for all other systems.  Studies Reviewed:    EKG: Sinus rhythm, rate 64, axis within normal limits, intervals within normal limits, left ventricular hypertrophy with polarization changes.   Risk Assessment/Calculations:    CHA2DS2-VASc Score = 3   This indicates a 3.2% annual risk of stroke. The patient's score is based upon: CHF History: 0 HTN History: 1 Diabetes History: 0 Stroke History: 0 Vascular Disease History: 0 Age Score: 1 Gender Score: 1        Physical Exam:   VS:  BP (!) 158/78 (BP Location: Left Arm, Patient Position: Sitting, Cuff Size: Normal)   Pulse 64   Ht 5\' 3"  (1.6 m)   Wt 190 lb 9.6 oz (86.5 kg)   SpO2 95%   BMI 33.76 kg/m    Wt Readings from Last 3 Encounters:  03/03/23 190 lb 9.6 oz (86.5 kg)  02/25/22 189 lb 9.6 oz (86 kg)  02/26/21 185 lb 9.6 oz (84.2 kg)     GEN: Well nourished, well developed in no acute distress NECK: No JVD; No carotid bruits CARDIAC: RRR, no murmurs, rubs, gallops RESPIRATORY:  Clear to  auscultation without rales, wheezing or rhonchi  ABDOMEN: Soft, non-tender, non-distended EXTREMITIES:  No edema; No deformity   ASSESSMENT AND PLAN:   ATRIAL FIB -   [name] has a CHA2DS2 - VASc score of 3.  No change in therapy.  QT is unchanged on Tikosyn.  HYPERTENSION, BENIGN -  Her blood pressure is elevated and I am going to add amlodipine 2.5 mg daily.    HYPERTROPHIC CARDIOMYOPATHY  She has moderate asymmetric LVH of the apical segement.   She was distressed because she was then told that she refused MRI in the past.  I have mentioned in my notes that there was a cost to MRI that she could not afford and she also did not want to consider the genetic testing because her daughter has already been seen by cardiologist in the past.  We had another long conversation about this.   She says she is not refusing MRI but it is cost consideration and she would be okay for orthopedic again.  She is going to continue to consider whether she wants genetic testing.  For now she has no symptoms.  She has no other high risk findings.  No change in therapy.   Follow up with me in 6 months  Signed, Rollene Rotunda, MD

## 2023-03-03 ENCOUNTER — Ambulatory Visit: Payer: Medicare HMO | Attending: Cardiology | Admitting: Cardiology

## 2023-03-03 ENCOUNTER — Encounter: Payer: Self-pay | Admitting: Cardiology

## 2023-03-03 VITALS — BP 158/78 | HR 64 | Ht 63.0 in | Wt 190.6 lb

## 2023-03-03 DIAGNOSIS — I422 Other hypertrophic cardiomyopathy: Secondary | ICD-10-CM

## 2023-03-03 DIAGNOSIS — I10 Essential (primary) hypertension: Secondary | ICD-10-CM | POA: Diagnosis not present

## 2023-03-03 DIAGNOSIS — I480 Paroxysmal atrial fibrillation: Secondary | ICD-10-CM

## 2023-03-03 MED ORDER — AMLODIPINE BESYLATE 2.5 MG PO TABS
2.5000 mg | ORAL_TABLET | Freq: Every day | ORAL | 3 refills | Status: DC
Start: 2023-03-03 — End: 2023-09-04

## 2023-03-03 NOTE — Patient Instructions (Signed)
Medication Instructions:   START AMLODIPINE 2.5 MG ONCE DAILY  *If you need a refill on your cardiac medications before your next appointment, please call your pharmacy*   Testing/Procedures:   You are scheduled for Cardiac MRI on ______________. Please arrive for your appointment at ______________ ( arrive 30-45 minutes prior to test start time). ?  [facility] [address] [place], [place] [address] [phone] Please take advantage of the free valet parking available at the MAIN entrance (A entrance).  Proceed to the [facility] Radiology Department (First Floor) for check-in.    Magnetic resonance imaging (MRI) is a painless test that produces images of the inside of the body without using Xrays.  During an MRI, strong magnets and radio waves work together in a Data processing manager to form detailed images.   MRI images may provide more details about a medical condition than X-rays, CT scans, and ultrasounds can provide.  You may be given earphones to listen for instructions.  You may eat a light breakfast and take medications as ordered. Please avoid stimulants for 12 hr prior to test. (Ie. Caffeine, nicotine, chocolate, or antihistamine medications)  If a contrast material will be used, an IV will be inserted into one of your veins. Contrast material will be injected into your IV. It will leave your body through your urine within a day. You may be told to drink plenty of fluids to help flush the contrast material out of your system.  You will be asked to remove all metal, including: Watch, jewelry, and other metal objects including hearing aids, hair pieces and dentures. Also wearable glucose monitoring systems (ie. Freestyle Libre and Omnipods) (Braces and fillings normally are not a problem.)   TEST WILL TAKE APPROXIMATELY 1 HOUR  PLEASE NOTIFY SCHEDULING AT LEAST 24 HOURS IN ADVANCE IF YOU ARE UNABLE TO KEEP YOUR APPOINTMENT. [phone]  Please call Rockwell Alexandria, cardiac imaging nurse navigator with any questions/concerns. Rockwell Alexandria RN Navigator Cardiac Imaging Larey Brick RN Navigator Cardiac Imaging [facility] [phone] Office     Follow-Up: At [facility], you and your health needs are our priority.  As part of our continuing mission to provide you with exceptional heart care, we have created designated Provider Care Teams.  These Care Teams include your primary Cardiologist (physician) and Advanced Practice Providers (APPs -  Physician Assistants and Nurse Practitioners) who all work together to provide you with the care you need, when you need it.  We recommend signing up for the patient portal called "MyChart".  Sign up information is provided on this After Visit Summary.  MyChart is used to connect with patients for Virtual Visits (Telemedicine).  Patients are able to view lab/test results, encounter notes, upcoming appointments, etc.  Non-urgent messages can be sent to your provider as well.   To learn more about what you can do with MyChart, go to [email or web address].    Your next appointment:   6 month(s)  Provider:   Rollene Rotunda, MD

## 2023-03-10 ENCOUNTER — Telehealth: Payer: Self-pay | Admitting: Cardiology

## 2023-03-10 DIAGNOSIS — I480 Paroxysmal atrial fibrillation: Secondary | ICD-10-CM

## 2023-03-10 NOTE — Telephone Encounter (Signed)
*  STAT* If patient is at the pharmacy, call can be transferred to refill team.   1. Which medications need to be refilled? (please list name of each medication and dose if known)  rivaroxaban (XARELTO) 20 MG TABS tablet  2. Which pharmacy/location (including street and city if local pharmacy) is medication to be sent to?[id number] [place], [place] - [address]  3. Do they need a 30 day or 90 day supply? 90 day

## 2023-03-11 MED ORDER — RIVAROXABAN 20 MG PO TABS
20.0000 mg | ORAL_TABLET | Freq: Every day | ORAL | 1 refills | Status: DC
Start: 2023-03-11 — End: 2023-08-18

## 2023-03-11 NOTE — Telephone Encounter (Signed)
Prescription refill request for Xarelto received.  Indication: Afib  Last office visit: 03/03/23 ([place])  Weight: 85.6kg Age: 72 Scr: 0.93 (12/26/22 via [facility])  CrCl: 76.3ml/min  Appropriate dose. Refill sent.

## 2023-03-27 ENCOUNTER — Telehealth: Payer: Self-pay | Admitting: Cardiology

## 2023-03-27 MED ORDER — MAGNESIUM OXIDE -MG SUPPLEMENT 400 (240 MG) MG PO TABS: ORAL_TABLET | ORAL | 3 refills | Status: AC

## 2023-03-27 NOTE — Telephone Encounter (Signed)
*  STAT* If patient is at the pharmacy, call can be transferred to refill team.   1. Which medications need to be refilled? (please list name of each medication and dose if known)   magnesium oxide (MAG-OX) 400 (240 Mg) MG tablet   2. Which pharmacy/location (including street and city if local pharmacy) is medication to be sent to?  [id number] [place], [place] - [address]   3. Do they need a 30 day or 90 day supply?   60 day  Patient stated she still has some medication left.

## 2023-03-27 NOTE — Telephone Encounter (Signed)
Pt's medication was sent to pt's pharmacy as requested. Confirmation received.  °

## 2023-05-05 ENCOUNTER — Telehealth (HOSPITAL_COMMUNITY): Payer: Self-pay | Admitting: *Deleted

## 2023-05-05 ENCOUNTER — Other Ambulatory Visit: Payer: Self-pay | Admitting: Cardiology

## 2023-05-05 NOTE — Telephone Encounter (Signed)
Reaching out to patient to offer assistance regarding upcoming cardiac imaging study; pt verbalizes understanding of appt date/time, parking situation and where to check in, pre-test NPO status and medications ordered, and verified current allergies; name and call back number provided for further questions should they arise Hayley Sharpe RN Navigator Cardiac Imaging [facility] [phone] office [phone] cell  

## 2023-05-07 ENCOUNTER — Telehealth: Payer: Self-pay | Admitting: Cardiology

## 2023-05-07 MED ORDER — POTASSIUM CHLORIDE CRYS ER 20 MEQ PO TBCR: 20.0000 meq | EXTENDED_RELEASE_TABLET | Freq: Every day | ORAL | 2 refills | Status: DC

## 2023-05-07 NOTE — Telephone Encounter (Signed)
Pt's medication was already sent to pt's pharmacy as requested. Confirmation received.  

## 2023-05-07 NOTE — Telephone Encounter (Signed)
*  STAT* If patient is at the pharmacy, call can be transferred to refill team.   1. Which medications need to be refilled? (please list name of each medication and dose if known) KLOR-CON M20 20 MEQ tablet  2. Which pharmacy/location (including street and city if local pharmacy) is medication to be sent to? [id number] [place], [place] - [address]    3. Do they need a 30 day or 90 day supply? 90 Day Supply

## 2023-05-08 ENCOUNTER — Telehealth (HOSPITAL_COMMUNITY): Payer: Self-pay | Admitting: *Deleted

## 2023-05-08 NOTE — Telephone Encounter (Signed)
Reaching out to patient to offer assistance regarding upcoming cardiac imaging study; pt verbalizes understanding of appt date/time, parking situation and where to check in, and verified current allergies; name and call back number provided for further questions should they arise  Larey Brick RN Navigator Cardiac Imaging [facility] [phone] office [phone] cell  Patient denies metal and claustrophobia.

## 2023-05-09 ENCOUNTER — Ambulatory Visit (HOSPITAL_COMMUNITY)
Admission: RE | Admit: 2023-05-09 | Discharge: 2023-05-09 | Disposition: A | Discharge status: Home / Self Care | Payer: Medicare HMO | Source: Ambulatory Visit | Attending: Cardiology | Admitting: Cardiology

## 2023-05-09 ENCOUNTER — Ambulatory Visit (HOSPITAL_COMMUNITY): Admission: RE | Admit: 2023-05-09 | Payer: Medicare HMO | Source: Ambulatory Visit

## 2023-05-09 ENCOUNTER — Other Ambulatory Visit: Payer: Self-pay | Admitting: Cardiology

## 2023-05-09 DIAGNOSIS — I422 Other hypertrophic cardiomyopathy: Secondary | ICD-10-CM | POA: Diagnosis not present

## 2023-05-09 MED ORDER — GADOBUTROL 1 MMOL/ML IV SOLN
10.0000 mL | Freq: Once | INTRAVENOUS | Status: AC | PRN
  Administered 2023-05-09: 10 mL via INTRAVENOUS

## 2023-05-14 ENCOUNTER — Telehealth: Payer: Self-pay | Admitting: Cardiology

## 2023-05-14 MED ORDER — ATORVASTATIN CALCIUM 40 MG PO TABS: 40.0000 mg | ORAL_TABLET | Freq: Every day | ORAL | 3 refills | Status: DC

## 2023-05-14 NOTE — Telephone Encounter (Signed)
*  STAT* If patient is at the pharmacy, call can be transferred to refill team.   1. Which medications need to be refilled? (please list name of each medication and dose if known) atorvastatin (LIPITOR) 40 MG tablet   2. Which pharmacy/location (including street and city if local pharmacy) is medication to be sent to? [id number] [place], [place] - [address]    3. Do they need a 30 day or 90 day supply? 90

## 2023-05-14 NOTE — Telephone Encounter (Signed)
[facility]

## 2023-06-05 ENCOUNTER — Other Ambulatory Visit: Payer: Self-pay | Admitting: Internal Medicine

## 2023-06-05 DIAGNOSIS — Z1231 Encounter for screening mammogram for malignant neoplasm of breast: Secondary | ICD-10-CM

## 2023-06-13 ENCOUNTER — Other Ambulatory Visit: Payer: Self-pay | Admitting: Cardiology

## 2023-07-21 ENCOUNTER — Ambulatory Visit
Admission: RE | Admit: 2023-07-21 | Discharge: 2023-07-21 | Disposition: A | Discharge status: Home / Self Care | Payer: Medicare HMO | Source: Ambulatory Visit | Attending: Internal Medicine

## 2023-07-21 DIAGNOSIS — Z1231 Encounter for screening mammogram for malignant neoplasm of breast: Secondary | ICD-10-CM | POA: Diagnosis not present

## 2023-08-09 DIAGNOSIS — E119 Type 2 diabetes mellitus without complications: Secondary | ICD-10-CM | POA: Diagnosis not present

## 2023-08-18 ENCOUNTER — Telehealth: Payer: Self-pay | Admitting: Cardiology

## 2023-08-18 DIAGNOSIS — I480 Paroxysmal atrial fibrillation: Secondary | ICD-10-CM

## 2023-08-18 MED ORDER — RIVAROXABAN 20 MG PO TABS
20.0000 mg | ORAL_TABLET | Freq: Every day | ORAL | 1 refills | Status: DC
Start: 2023-08-18 — End: 2023-09-04

## 2023-08-18 NOTE — Telephone Encounter (Signed)
*  STAT* If patient is at the pharmacy, call can be transferred to refill team.   1. Which medications need to be refilled? (please list name of each medication and dose if known)   rivaroxaban (XARELTO) 20 MG TABS tablet      4. Which pharmacy/location (including street and city if local pharmacy) is medication to be sent to? [id number] [place], [place] - [address]     5. Do they need a 30 day or 90 day supply? 90

## 2023-08-18 NOTE — Telephone Encounter (Signed)
Spoke with pt. Pt is aware that her prescription was sent to her pharmacy.

## 2023-09-03 DIAGNOSIS — I422 Other hypertrophic cardiomyopathy: Secondary | ICD-10-CM | POA: Insufficient documentation

## 2023-09-03 NOTE — Progress Notes (Unsigned)
  Cardiology Office Note:   Date:  09/04/2023  ID:  [address] [place], [place] Jan 20, 1951, MRN [id number] PCP: Ollen Bowl, MD  [facility] Providers Cardiologist:  Rollene Rotunda, MD Electrophysiologist:  Will Jorja Loa, MD {  History of Present Illness:   [name] is a 72 y.o. female  who presents for followup of atrial fibrillation and hypertrophic cardiomyopathy.  She has been followed in the atrial fib clinic.  She had ablation of her atrial fib by Dr. Elberta Fortis.   Since I last saw her she had an MRI.  This demonstrated severe biatrial dilatation.  There is some apical hypertrophy.  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  Maximal LV thickness is 22 mm.    She states she feels great.  She is doing some exercises.  She does a little light weight lifting.  She shoots some basketball with her grandson.  She walks. The patient denies any new symptoms such as chest discomfort, neck or arm discomfort. There has been no new shortness of breath, PND or orthopnea. There have been no reported palpitations, presyncope or syncope.   ROS: As stated in the HPI and negative for all other systems.  Studies Reviewed:    EKG:   NA   Risk Assessment/Calculations:    CHA2DS2-VASc Score = 3   This indicates a 3.2% annual risk of stroke. The patient's score is based upon: CHF History: 0 HTN History: 1 Diabetes History: 0 Stroke History: 0 Vascular Disease History: 0 Age Score: 1 Gender Score: 1    Physical Exam:   VS:  BP (!) 144/86 (BP Location: Left Arm, Patient Position: Sitting, Cuff Size: Normal)   Pulse 76   Ht 5\' 3"  (1.6 m)   Wt 185 lb (83.9 kg)   SpO2 95%   BMI 32.77 kg/m    Wt Readings from Last 3 Encounters:  09/04/23 185 lb (83.9 kg)  03/03/23 190 lb 9.6 oz (86.5 kg)  02/25/22 189 lb 9.6 oz (86 kg)     GEN: Well nourished, well developed in no acute distress NECK: No JVD; No carotid bruits CARDIAC: RRR, no murmurs, rubs, gallops RESPIRATORY:   Clear to auscultation without rales, wheezing or rhonchi  ABDOMEN: Soft, non-tender, non-distended EXTREMITIES:  No edema; No deformity   ASSESSMENT AND PLAN:   ATRIAL FIB :  She has no symptomatic paroxysms.  No change in therapy.  QT is at target.    HYPERTENSION, BENIGN : Her blood pressure is well-controlled.  No change in therapy.   HYPERTROPHIC CARDIOMYOPATHY -the patient has had moderate asymmetric left ventricular apical hypertrophy.  This is an apical variant with some aneurysmal dilatation.  She has no other high risk features but I am going to put a 3-day Zio patch on her.  She has not wanted to consider genetic testing.  At this point I see no indication for change in therapy or ICD.         Follow up with me in one year.   Signed, Rollene Rotunda, MD

## 2023-09-04 ENCOUNTER — Ambulatory Visit: Payer: Medicare HMO | Attending: Cardiology | Admitting: Cardiology

## 2023-09-04 ENCOUNTER — Encounter: Payer: Self-pay | Admitting: Cardiology

## 2023-09-04 ENCOUNTER — Ambulatory Visit (INDEPENDENT_AMBULATORY_CARE_PROVIDER_SITE_OTHER): Payer: Medicare HMO

## 2023-09-04 ENCOUNTER — Other Ambulatory Visit (HOSPITAL_COMMUNITY): Payer: Self-pay

## 2023-09-04 VITALS — BP 144/86 | HR 76 | Ht 63.0 in | Wt 185.0 lb

## 2023-09-04 DIAGNOSIS — I10 Essential (primary) hypertension: Secondary | ICD-10-CM

## 2023-09-04 DIAGNOSIS — I422 Other hypertrophic cardiomyopathy: Secondary | ICD-10-CM

## 2023-09-04 DIAGNOSIS — I4819 Other persistent atrial fibrillation: Secondary | ICD-10-CM | POA: Diagnosis not present

## 2023-09-04 DIAGNOSIS — I480 Paroxysmal atrial fibrillation: Secondary | ICD-10-CM | POA: Diagnosis not present

## 2023-09-04 MED ORDER — DOFETILIDE 250 MCG PO CAPS
250.0000 ug | ORAL_CAPSULE | Freq: Two times a day (BID) | ORAL | 3 refills | Status: AC
  Filled 2023-09-04 – 2024-02-24 (×2): qty 180, 90d supply, fill #0
  Filled 2024-05-24: qty 180, 90d supply, fill #1
  Filled 2024-08-26: qty 180, 90d supply, fill #2

## 2023-09-04 MED ORDER — LOSARTAN POTASSIUM 100 MG PO TABS: 100.0000 mg | ORAL_TABLET | Freq: Every day | ORAL | 3 refills | Status: DC

## 2023-09-04 MED ORDER — JARDIANCE 10 MG PO TABS
10.0000 mg | ORAL_TABLET | Freq: Every day | ORAL | 3 refills | Status: DC
  Filled 2023-09-04 – 2024-01-14 (×2): qty 90, 90d supply, fill #0
  Filled 2024-04-09: qty 90, 90d supply, fill #1
  Filled 2024-07-12: qty 90, 90d supply, fill #2

## 2023-09-04 MED ORDER — ATORVASTATIN CALCIUM 40 MG PO TABS
40.0000 mg | ORAL_TABLET | Freq: Every day | ORAL | 3 refills | Status: AC
  Filled 2023-09-04: qty 90, 90d supply, fill #0

## 2023-09-04 MED ORDER — POTASSIUM CHLORIDE CRYS ER 20 MEQ PO TBCR: 20.0000 meq | EXTENDED_RELEASE_TABLET | Freq: Every day | ORAL | 3 refills | Status: DC

## 2023-09-04 MED ORDER — METOPROLOL TARTRATE 25 MG PO TABS: 25.0000 mg | ORAL_TABLET | Freq: Two times a day (BID) | ORAL | 3 refills | Status: DC

## 2023-09-04 MED ORDER — LOSARTAN POTASSIUM 100 MG PO TABS
100.0000 mg | ORAL_TABLET | Freq: Every day | ORAL | 3 refills | Status: DC
  Filled 2023-09-04: qty 90, 90d supply, fill #0
  Filled 2023-12-01: qty 90, 90d supply, fill #1
  Filled 2024-03-29: qty 90, 90d supply, fill #2
  Filled 2024-06-08: qty 90, 90d supply, fill #3

## 2023-09-04 MED ORDER — JARDIANCE 10 MG PO TABS: 10.0000 mg | ORAL_TABLET | Freq: Every day | ORAL | 3 refills | Status: DC

## 2023-09-04 MED ORDER — POTASSIUM CHLORIDE CRYS ER 20 MEQ PO TBCR
20.0000 meq | EXTENDED_RELEASE_TABLET | Freq: Every day | ORAL | 3 refills | Status: DC
  Filled 2023-09-04 – 2023-12-01 (×2): qty 90, 90d supply, fill #0
  Filled 2023-12-17: qty 30, 30d supply, fill #0
  Filled 2023-12-17: qty 90, 90d supply, fill #0
  Filled 2024-03-29: qty 30, 30d supply, fill #1
  Filled 2024-05-06: qty 30, 30d supply, fill #2
  Filled 2024-06-08: qty 30, 30d supply, fill #3
  Filled 2024-07-05: qty 30, 30d supply, fill #4
  Filled 2024-08-09: qty 30, 30d supply, fill #5

## 2023-09-04 MED ORDER — RIVAROXABAN 20 MG PO TABS
20.0000 mg | ORAL_TABLET | Freq: Every day | ORAL | 3 refills | Status: DC
Start: 2023-09-04 — End: 2023-09-04

## 2023-09-04 MED ORDER — METOPROLOL TARTRATE 25 MG PO TABS
25.0000 mg | ORAL_TABLET | Freq: Two times a day (BID) | ORAL | 3 refills | Status: AC
  Filled 2023-09-04 – 2024-02-16 (×2): qty 180, 90d supply, fill #0
  Filled 2024-05-13: qty 180, 90d supply, fill #1
  Filled 2024-08-15 – 2024-08-19 (×2): qty 180, 90d supply, fill #2

## 2023-09-04 MED ORDER — ATORVASTATIN CALCIUM 40 MG PO TABS: 40.0000 mg | ORAL_TABLET | Freq: Every day | ORAL | 3 refills | Status: DC

## 2023-09-04 MED ORDER — RIVAROXABAN 20 MG PO TABS
20.0000 mg | ORAL_TABLET | Freq: Every day | ORAL | 3 refills | Status: AC
  Filled 2023-09-04 – 2023-11-10 (×2): qty 90, 90d supply, fill #0
  Filled 2024-02-16 (×2): qty 90, 90d supply, fill #1
  Filled 2024-05-13: qty 90, 90d supply, fill #2
  Filled 2024-08-15 – 2024-08-16 (×2): qty 90, 90d supply, fill #3

## 2023-09-04 MED ORDER — DOFETILIDE 250 MCG PO CAPS: 250.0000 ug | ORAL_CAPSULE | Freq: Two times a day (BID) | ORAL | 3 refills | Status: DC

## 2023-09-04 MED ORDER — AMLODIPINE BESYLATE 2.5 MG PO TABS: 2.5000 mg | ORAL_TABLET | Freq: Every day | ORAL | 3 refills | Status: DC

## 2023-09-04 MED ORDER — AMLODIPINE BESYLATE 2.5 MG PO TABS
2.5000 mg | ORAL_TABLET | Freq: Every day | ORAL | 3 refills | Status: DC
  Filled 2023-09-04 – 2024-01-14 (×2): qty 100, 100d supply, fill #0
  Filled 2024-04-09: qty 100, 100d supply, fill #1
  Filled 2024-07-12: qty 100, 100d supply, fill #2

## 2023-09-04 NOTE — Progress Notes (Unsigned)
Enrolled patient for a 3 day Zio XT monitor to be mailed to patients home  

## 2023-09-04 NOTE — Patient Instructions (Signed)
Medication Instructions:  No changes.  *If you need a refill on your cardiac medications before your next appointment, please call your pharmacy*  Testing/Procedures: ZIO XT- Long Term Monitor Instructions  Your physician has requested you wear a ZIO patch monitor for 3 days.  This is a single patch monitor. [employer] supplies one patch monitor per enrollment. Additional stickers are not available. Please do not apply patch if you will be having a Nuclear Stress Test,  Echocardiogram, Cardiac CT, MRI, or Chest Xray during the period you would be wearing the  monitor. The patch cannot be worn during these tests. You cannot remove and re-apply the  ZIO XT patch monitor.  Your ZIO patch monitor will be mailed 3 day USPS to your address on file. It may take 3-5 days  to receive your monitor after you have been enrolled.  Once you have received your monitor, please review the enclosed instructions. Your monitor  has already been registered assigning a specific monitor serial # to you.  Billing and Patient Assistance Program Information  We have supplied [employer] with any of your insurance information on file for billing purposes. [employer] offers a sliding scale Patient Assistance Program for patients that do not have  insurance, or whose insurance does not completely cover the cost of the ZIO monitor.  You must apply for the Patient Assistance Program to qualify for this discounted rate.  To apply, please call [employer] at [phone], select option 4, select option 2, ask to apply for  Patient Assistance Program. Meredeth Ide will ask your household income, and how many people  are in your household. They will quote your out-of-pocket cost based on that information.  [employer] will also be able to set up a 31-month, interest-free payment plan if needed.  Applying the monitor   Shave hair from upper left chest.  Hold abrader disc by orange tab. Rub abrader in 40 strokes over the upper left chest as   indicated in your monitor instructions.  Clean area with 4 enclosed alcohol pads. Let dry.  Apply patch as indicated in monitor instructions. Patch will be placed under collarbone on left  side of chest with arrow pointing upward.  Rub patch adhesive wings for 2 minutes. Remove white label marked "1". Remove the white  label marked "2". Rub patch adhesive wings for 2 additional minutes.  While looking in a mirror, press and release button in center of patch. A small green light will  flash 3-4 times. This will be your only indicator that the monitor has been turned on.  Do not shower for the first 24 hours. You may shower after the first 24 hours.  Press the button if you feel a symptom. You will hear a small click. Record Date, Time and  Symptom in the Patient Logbook.  When you are ready to remove the patch, follow instructions on the last 2 pages of Patient  Logbook. Stick patch monitor onto the last page of Patient Logbook.  Place Patient Logbook in the blue and white box. Use locking tab on box and tape box closed  securely. The blue and white box has prepaid postage on it. Please place it in the mailbox as  soon as possible. Your physician should have your test results approximately 7 days after the  monitor has been mailed back to [facility].  Call [facility] Customer Care at [phone] if you have questions regarding  your ZIO XT patch monitor. Call them immediately if you see an orange light  blinking on your  monitor.  If your monitor falls off in less than 4 days, contact our Monitor department at [phone].  If your monitor becomes loose or falls off after 4 days call [employer] at [phone] for  suggestions on securing your monitor    Follow-Up: At [facility], you and your health needs are our priority.  As part of our continuing mission to provide you with exceptional heart care, we have created designated Provider Care Teams.  These Care Teams  include your primary Cardiologist (physician) and Advanced Practice Providers (APPs -  Physician Assistants and Nurse Practitioners) who all work together to provide you with the care you need, when you need it.  We recommend signing up for the patient portal called "MyChart".  Sign up information is provided on this After Visit Summary.  MyChart is used to connect with patients for Virtual Visits (Telemedicine).  Patients are able to view lab/test results, encounter notes, upcoming appointments, etc.  Non-urgent messages can be sent to your provider as well.   To learn more about what you can do with MyChart, go to [email or web address].    Your next appointment:   12 month(s)  Provider:   Rollene Rotunda, MD     Other Instructions Please mail her Zio to post office address on file.

## 2023-09-05 ENCOUNTER — Other Ambulatory Visit (HOSPITAL_COMMUNITY): Payer: Self-pay

## 2023-09-11 DIAGNOSIS — I422 Other hypertrophic cardiomyopathy: Secondary | ICD-10-CM | POA: Diagnosis not present

## 2023-09-11 DIAGNOSIS — I4819 Other persistent atrial fibrillation: Secondary | ICD-10-CM

## 2023-09-23 DIAGNOSIS — I4819 Other persistent atrial fibrillation: Secondary | ICD-10-CM | POA: Diagnosis not present

## 2023-10-07 ENCOUNTER — Other Ambulatory Visit (HOSPITAL_COMMUNITY): Payer: Self-pay

## 2023-10-07 MED ORDER — VITAMIN D (ERGOCALCIFEROL) 1.25 MG (50000 UNIT) PO CAPS
50000.0000 [IU] | ORAL_CAPSULE | Freq: Every day | ORAL | 3 refills | Status: DC
  Filled 2023-10-07: qty 4, 4d supply, fill #0
  Filled 2023-11-10: qty 4, 4d supply, fill #1
  Filled 2023-12-01: qty 4, 4d supply, fill #2
  Filled 2024-01-01: qty 4, 4d supply, fill #3

## 2023-10-07 MED ORDER — ATORVASTATIN CALCIUM 40 MG PO TABS
40.0000 mg | ORAL_TABLET | Freq: Every day | ORAL | 1 refills | Status: DC
  Filled 2023-10-07 – 2023-12-01 (×2): qty 90, 90d supply, fill #0
  Filled 2024-02-16: qty 90, 90d supply, fill #1

## 2023-10-15 ENCOUNTER — Other Ambulatory Visit (HOSPITAL_COMMUNITY): Payer: Self-pay

## 2023-11-10 ENCOUNTER — Other Ambulatory Visit (HOSPITAL_COMMUNITY): Payer: Self-pay

## 2023-12-01 ENCOUNTER — Other Ambulatory Visit (HOSPITAL_COMMUNITY): Payer: Self-pay

## 2023-12-02 ENCOUNTER — Other Ambulatory Visit: Payer: Self-pay

## 2023-12-17 ENCOUNTER — Other Ambulatory Visit (HOSPITAL_COMMUNITY): Payer: Self-pay

## 2023-12-25 ENCOUNTER — Other Ambulatory Visit (HOSPITAL_COMMUNITY): Payer: Self-pay

## 2023-12-25 ENCOUNTER — Other Ambulatory Visit: Payer: Self-pay

## 2023-12-25 MED ORDER — CICLOPIROX 8 % EX SOLN
1.0000 | Freq: Every day | CUTANEOUS | 1 refills | Status: AC
  Filled 2023-12-25: qty 19.8, 90d supply, fill #0
  Filled 2024-05-13: qty 19.8, 90d supply, fill #1

## 2023-12-26 ENCOUNTER — Other Ambulatory Visit (HOSPITAL_COMMUNITY): Payer: Self-pay

## 2024-01-01 ENCOUNTER — Other Ambulatory Visit (HOSPITAL_COMMUNITY): Payer: Self-pay

## 2024-01-01 MED ORDER — VITAMIN D (ERGOCALCIFEROL) 1.25 MG (50000 UNIT) PO CAPS
50000.0000 [IU] | ORAL_CAPSULE | ORAL | 0 refills | Status: DC
Start: 2023-10-07 — End: 2024-02-24
  Filled 2024-02-02: qty 4, 28d supply, fill #0

## 2024-01-02 ENCOUNTER — Other Ambulatory Visit (HOSPITAL_COMMUNITY): Payer: Self-pay

## 2024-01-14 ENCOUNTER — Other Ambulatory Visit (HOSPITAL_COMMUNITY): Payer: Self-pay

## 2024-02-02 ENCOUNTER — Other Ambulatory Visit (HOSPITAL_COMMUNITY): Payer: Self-pay

## 2024-02-16 ENCOUNTER — Other Ambulatory Visit (HOSPITAL_COMMUNITY): Payer: Self-pay

## 2024-02-16 ENCOUNTER — Other Ambulatory Visit: Payer: Self-pay

## 2024-02-17 ENCOUNTER — Other Ambulatory Visit (HOSPITAL_COMMUNITY): Payer: Self-pay

## 2024-02-24 ENCOUNTER — Other Ambulatory Visit (HOSPITAL_COMMUNITY): Payer: Self-pay

## 2024-02-24 MED ORDER — VITAMIN D (ERGOCALCIFEROL) 1.25 MG (50000 UNIT) PO CAPS
50000.0000 [IU] | ORAL_CAPSULE | ORAL | 3 refills | Status: DC
Start: 2024-02-24 — End: 2024-06-21
  Filled 2024-02-24: qty 4, 28d supply, fill #0
  Filled 2024-03-26: qty 4, 28d supply, fill #1
  Filled 2024-04-20: qty 4, 28d supply, fill #2
  Filled 2024-05-24 (×2): qty 4, 28d supply, fill #3

## 2024-03-26 ENCOUNTER — Other Ambulatory Visit (HOSPITAL_COMMUNITY): Payer: Self-pay

## 2024-03-29 ENCOUNTER — Other Ambulatory Visit (HOSPITAL_COMMUNITY): Payer: Self-pay

## 2024-04-09 ENCOUNTER — Other Ambulatory Visit (HOSPITAL_COMMUNITY): Payer: Self-pay

## 2024-04-15 DIAGNOSIS — Z0100 Encounter for examination of eyes and vision without abnormal findings: Secondary | ICD-10-CM | POA: Diagnosis not present

## 2024-04-20 ENCOUNTER — Other Ambulatory Visit (HOSPITAL_COMMUNITY): Payer: Self-pay

## 2024-04-21 ENCOUNTER — Other Ambulatory Visit (HOSPITAL_COMMUNITY): Payer: Self-pay

## 2024-04-21 ENCOUNTER — Other Ambulatory Visit (HOSPITAL_BASED_OUTPATIENT_CLINIC_OR_DEPARTMENT_OTHER): Payer: Self-pay

## 2024-04-21 DIAGNOSIS — R300 Dysuria: Secondary | ICD-10-CM | POA: Diagnosis not present

## 2024-04-21 DIAGNOSIS — E1169 Type 2 diabetes mellitus with other specified complication: Secondary | ICD-10-CM | POA: Diagnosis not present

## 2024-04-21 DIAGNOSIS — I480 Paroxysmal atrial fibrillation: Secondary | ICD-10-CM | POA: Diagnosis not present

## 2024-04-21 DIAGNOSIS — I10 Essential (primary) hypertension: Secondary | ICD-10-CM | POA: Diagnosis not present

## 2024-04-21 DIAGNOSIS — D6869 Other thrombophilia: Secondary | ICD-10-CM | POA: Diagnosis not present

## 2024-04-21 MED ORDER — NITROFURANTOIN MONOHYD MACRO 100 MG PO CAPS
100.0000 mg | ORAL_CAPSULE | Freq: Two times a day (BID) | ORAL | 0 refills | Status: AC
  Filled 2024-04-21: qty 14, 7d supply, fill #0

## 2024-05-06 ENCOUNTER — Other Ambulatory Visit (HOSPITAL_COMMUNITY): Payer: Self-pay

## 2024-05-13 ENCOUNTER — Other Ambulatory Visit (HOSPITAL_COMMUNITY): Payer: Self-pay

## 2024-05-24 ENCOUNTER — Other Ambulatory Visit (HOSPITAL_COMMUNITY): Payer: Self-pay

## 2024-05-24 MED ORDER — ATORVASTATIN CALCIUM 40 MG PO TABS
40.0000 mg | ORAL_TABLET | Freq: Every day | ORAL | 1 refills | Status: AC
  Filled 2024-05-24: qty 90, 90d supply, fill #0
  Filled 2024-08-26: qty 90, 90d supply, fill #1

## 2024-05-29 DIAGNOSIS — Z23 Encounter for immunization: Secondary | ICD-10-CM | POA: Diagnosis not present

## 2024-06-08 ENCOUNTER — Other Ambulatory Visit (HOSPITAL_COMMUNITY): Payer: Self-pay

## 2024-06-21 ENCOUNTER — Other Ambulatory Visit (HOSPITAL_COMMUNITY): Payer: Self-pay

## 2024-06-21 MED ORDER — VITAMIN D (ERGOCALCIFEROL) 1.25 MG (50000 UNIT) PO CAPS
50000.0000 [IU] | ORAL_CAPSULE | ORAL | 3 refills | Status: DC
  Filled 2024-06-21: qty 4, 28d supply, fill #0
  Filled 2024-07-12: qty 4, 28d supply, fill #1
  Filled 2024-08-15: qty 4, 28d supply, fill #2
  Filled 2024-09-13: qty 4, 28d supply, fill #3

## 2024-06-23 ENCOUNTER — Other Ambulatory Visit (HOSPITAL_COMMUNITY): Payer: Self-pay

## 2024-06-23 ENCOUNTER — Other Ambulatory Visit: Payer: Self-pay | Admitting: Internal Medicine

## 2024-06-23 DIAGNOSIS — Z1231 Encounter for screening mammogram for malignant neoplasm of breast: Secondary | ICD-10-CM

## 2024-06-29 ENCOUNTER — Telehealth: Payer: Self-pay

## 2024-06-29 DIAGNOSIS — Z01818 Encounter for other preprocedural examination: Secondary | ICD-10-CM

## 2024-06-29 DIAGNOSIS — Z860101 Personal history of adenomatous and serrated colon polyps: Secondary | ICD-10-CM | POA: Diagnosis not present

## 2024-06-29 DIAGNOSIS — I4891 Unspecified atrial fibrillation: Secondary | ICD-10-CM | POA: Diagnosis not present

## 2024-06-29 DIAGNOSIS — Z7901 Long term (current) use of anticoagulants: Secondary | ICD-10-CM | POA: Diagnosis not present

## 2024-06-29 NOTE — Telephone Encounter (Signed)
   Pre-operative Risk Assessment    Patient Name: [name]  DOB: October 17, 1950 MRN: [id number]   Date of last office visit: 09/11/23 Date of next office visit: 08/25/24   Request for Surgical Clearance    Procedure:  Colonoscopy   Date of Surgery:  Clearance 07/27/24                                 Surgeon:  Dr. Elicia, Parag  Surgeon's Group or Practice Name:  [facility]  Phone number:  [phone] Fax number:  [phone]   Type of Clearance Requested:   - Medical  - Pharmacy:  Hold Rivaroxaban  (Xarelto ) 2 days prior    Type of Anesthesia:  propofol     Additional requests/questions:    [name]   06/29/2024, 3:27 PM

## 2024-07-02 NOTE — Telephone Encounter (Signed)
 Patient with diagnosis of atrial fibrillation on Xarelto  for anticoagulation.    Procedure:  Colonoscopy    Date of Surgery:  Clearance 07/27/24       CHA2DS2-VASc Score = 5   This indicates a 7.2% annual risk of stroke. The patient's score is based upon: CHF History: 0 HTN History: 1 Diabetes History: 0 Stroke History: 2 Vascular Disease History: 0 Age Score: 1 Gender Score: 1   No recent labs found  Patient has not had an Afib/aflutter ablation in the last 3 months, DCCV within the last 4 weeks or a watchman implanted in the last 45 days

## 2024-07-02 NOTE — Telephone Encounter (Signed)
 Patient needs CBC and BMET for pharmacy recommendations. Will also need VV after pharmacy calculates hold time.

## 2024-07-05 ENCOUNTER — Other Ambulatory Visit (HOSPITAL_COMMUNITY): Payer: Self-pay

## 2024-07-12 ENCOUNTER — Other Ambulatory Visit (HOSPITAL_COMMUNITY): Payer: Self-pay

## 2024-07-14 ENCOUNTER — Telehealth (HOSPITAL_BASED_OUTPATIENT_CLINIC_OR_DEPARTMENT_OTHER): Payer: Self-pay | Admitting: *Deleted

## 2024-07-14 ENCOUNTER — Other Ambulatory Visit (HOSPITAL_COMMUNITY): Payer: Self-pay

## 2024-07-14 DIAGNOSIS — Z01818 Encounter for other preprocedural examination: Secondary | ICD-10-CM | POA: Diagnosis not present

## 2024-07-14 MED ORDER — PEG 3350-KCL-NA BICARB-NACL 420 G PO SOLR
ORAL | 0 refills | Status: AC
  Filled 2024-07-14: qty 4000, 1d supply, fill #0

## 2024-07-14 MED ORDER — BISACODYL 5 MG PO TBEC
DELAYED_RELEASE_TABLET | ORAL | 0 refills | Status: AC
  Filled 2024-07-14: qty 4, 1d supply, fill #0

## 2024-07-14 NOTE — Addendum Note (Signed)
 Addended by: WYNETTA NIELS HERO on: 07/14/2024 08:46 AM   Modules accepted: Orders

## 2024-07-14 NOTE — Telephone Encounter (Signed)
 Pt will get labs today BMP/CBC for preop clearance. Tele preop appt 07/22/24. Med rec and consent are done.

## 2024-07-14 NOTE — Telephone Encounter (Signed)
 Pt will get labs today BMP/CBC for preop clearance. Tele preop appt 07/22/24. Med rec and consent are done.      Patient Consent for Virtual Visit        [name] has provided verbal consent on 07/14/2024 for a virtual visit (video or telephone).   CONSENT FOR VIRTUAL VISIT FOR:  [name]  By participating in this virtual visit I agree to the following:  I hereby voluntarily request, consent and authorize [facility] and its employed or contracted physicians, physician assistants, nurse practitioners or other licensed health care professionals (the Practitioner), to provide me with telemedicine health care services (the "Services) as deemed necessary by the treating Practitioner. I acknowledge and consent to receive the Services by the Practitioner via telemedicine. I understand that the telemedicine visit will involve communicating with the Practitioner through live audiovisual communication technology and the disclosure of certain medical information by electronic transmission. I acknowledge that I have been given the opportunity to request an in-person assessment or other available alternative prior to the telemedicine visit and am voluntarily participating in the telemedicine visit.  I understand that I have the right to withhold or withdraw my consent to the use of telemedicine in the course of my care at any time, without affecting my right to future care or treatment, and that the Practitioner or I may terminate the telemedicine visit at any time. I understand that I have the right to inspect all information obtained and/or recorded in the course of the telemedicine visit and may receive copies of available information for a reasonable fee.  I understand that some of the potential risks of receiving the Services via telemedicine include:  Delay or interruption in medical evaluation due to technological equipment failure or disruption; Information  transmitted may not be sufficient (e.g. poor resolution of images) to allow for appropriate medical decision making by the Practitioner; and/or  In rare instances, security protocols could fail, causing a breach of personal health information.  Furthermore, I acknowledge that it is my responsibility to provide information about my medical history, conditions and care that is complete and accurate to the best of my ability. I acknowledge that Practitioner's advice, recommendations, and/or decision may be based on factors not within their control, such as incomplete or inaccurate data provided by me or distortions of diagnostic images or specimens that may result from electronic transmissions. I understand that the practice of medicine is not an exact science and that Practitioner makes no warranties or guarantees regarding treatment outcomes. I acknowledge that a copy of this consent can be made available to me via my patient portal [facility] MyChart), or I can request a printed copy by calling the office of [facility].    I understand that my insurance will be billed for this visit.   I have read or had this consent read to me. I understand the contents of this consent, which adequately explains the benefits and risks of the Services being provided via telemedicine.  I have been provided ample opportunity to ask questions regarding this consent and the Services and have had my questions answered to my satisfaction. I give my informed consent for the services to be provided through the use of telemedicine in my medical care

## 2024-07-15 LAB — CBC
Hematocrit: 45.3 % (ref 34.0–46.6)
Hemoglobin: 13.6 g/dL (ref 11.1–15.9)
MCH: 24.4 pg — ABNORMAL LOW (ref 26.6–33.0)
MCHC: 30 g/dL — ABNORMAL LOW (ref 31.5–35.7)
MCV: 81 fL (ref 79–97)
Platelets: 217 x10E3/uL (ref 150–450)
RBC: 5.57 x10E6/uL — ABNORMAL HIGH (ref 3.77–5.28)
RDW: 13.7 % (ref 11.7–15.4)
WBC: 4.6 x10E3/uL (ref 3.4–10.8)

## 2024-07-15 LAB — BASIC METABOLIC PANEL WITH GFR
BUN/Creatinine Ratio: 18 (ref 12–28)
BUN: 17 mg/dL (ref 8–27)
CO2: 23 mmol/L (ref 20–29)
Calcium: 10 mg/dL (ref 8.7–10.3)
Chloride: 104 mmol/L (ref 96–106)
Creatinine, Ser: 0.95 mg/dL (ref 0.57–1.00)
Glucose: 91 mg/dL (ref 70–99)
Potassium: 4.8 mmol/L (ref 3.5–5.2)
Sodium: 140 mmol/L (ref 134–144)
eGFR: 64 mL/min/1.73 (ref >59)

## 2024-07-15 NOTE — Telephone Encounter (Signed)
 Pt has tele 07/22/24

## 2024-07-15 NOTE — Telephone Encounter (Signed)
 Primary Cardiologist:James Hochrein, MD   Preoperative team, please contact this patient and set up a phone call appointment for further preoperative risk assessment. Please obtain consent and complete medication review. Thank you for your help.   I confirm that guidance regarding antiplatelet and oral anticoagulation therapy has been completed and, if necessary, noted below.  Patient may hold Xarelto  for 2 days prior to procedure.   I also confirmed the patient resides in the state of [place] . As per [facility] Medical Board telemedicine laws, the patient must reside in the state in which the provider is licensed.   Bethany EMERSON Bane, NP-C  07/15/2024, 9:19 AM [address], [address] [place], [place] [address] Office [phone] Fax [phone]

## 2024-07-15 NOTE — Telephone Encounter (Signed)
  Patient with diagnosis of atrial fibrillation on Xarelto  for anticoagulation.     Procedure:  Colonoscopy    Date of Surgery:  Clearance 07/27/24         CHA2DS2-VASc Score = 5   This indicates a 7.2% annual risk of stroke. The patient's score is based upon: CHF History: 0 HTN History: 1 Diabetes History: 0 Stroke History: 2 Vascular Disease History: 0 Age Score: 1 Gender Score: 1     CrCl:54 ml/min Plt: 217  Patient may hold Xarelto  for 2 days prior to procedure.   Patient has not had an Afib/aflutter ablation in the last 3 months, DCCV within the last 4 weeks or a watchman implanted in the last 45 days

## 2024-07-22 ENCOUNTER — Ambulatory Visit: Attending: Cardiovascular Disease | Admitting: Cardiology

## 2024-07-22 DIAGNOSIS — Z01810 Encounter for preprocedural cardiovascular examination: Secondary | ICD-10-CM

## 2024-07-22 DIAGNOSIS — Z01818 Encounter for other preprocedural examination: Secondary | ICD-10-CM

## 2024-07-22 NOTE — Progress Notes (Signed)
 Virtual Visit via Telephone Note   Because of [name] co-morbid illnesses, she is at least at moderate risk for complications without adequate follow up.  This format is felt to be most appropriate for this patient at this time.  Due to technical limitations with video connection (technology), today's appointment will be conducted as an audio only telehealth visit, and [name] verbally agreed to proceed in this manner.   All issues noted in this document were discussed and addressed.  No physical exam could be performed with this format.  Evaluation Performed:  Preoperative cardiovascular risk assessment _____________   Date:  07/22/2024   Patient ID:  [address] [place], [place] [birth year], MRN [id number] Patient Location:  Home Provider location:   Office  Primary Care Provider:  Vernon Velna SAUNDERS, MD Primary Cardiologist:  Lynwood Schilling, MD  Chief Complaint / Patient Profile   73 y.o. y/o female with a h/o atrial fibrillation on Xarelto  s/p ablation, hypertrophic cardiomyopathy, hypertension who is pending colonoscopy and presents today for telephonic preoperative cardiovascular risk assessment.  History of Present Illness    [name] is a 73 y.o. female who presents via audio/video conferencing for a telehealth visit today.  Pt was last seen in cardiology clinic on 09/04/2023 by Dr. Schilling.  At that time [facility] was doing well, feeling good, walking for exercise.  The patient is now pending procedure as outlined above. Since her last visit, she has had no major health changes, continues to stay active and exercising. She denies chest pain, palpitations, dyspnea, pnd, orthopnea, n, v, dizziness, syncope, edema, weight gain, or early satiety.    Past Medical History    Past Medical History:  Diagnosis Date   Atrial fibrillation (HCC)    Atrial flutter (HCC)    Hypertension    Left ventricular hypertrophy     possibly hypertrophic cardiomyopathy   Obesity    Stroke [facility])    Past Surgical History:  Procedure Laterality Date   ABDOMINAL HYSTERECTOMY     ATRIAL FIBRILLATION ABLATION N/A 11/12/2018   Procedure: ATRIAL FIBRILLATION ABLATION;  Surgeon: Inocencio Soyla Lunger, MD;  Location: [facility] INVASIVE CV LAB;  Service: Cardiovascular;  Laterality: N/A;   TEE WITHOUT CARDIOVERSION N/A 11/11/2018   Procedure: TRANSESOPHAGEAL ECHOCARDIOGRAM (TEE);  Surgeon: Mona Vinie BROCKS, MD;  Location: [facility] ENDOSCOPY;  Service: Cardiovascular;  Laterality: N/A;    Allergies  Allergies  Allergen Reactions   Penicillin G     Other reaction(s): Unknown   Latex Itching    Home Medications    Prior to Admission medications   Medication Sig Start Date End Date Taking? Authorizing Provider  amLODipine  (NORVASC ) 2.5 MG tablet Take 1 tablet (2.5 mg total) by mouth daily. 09/04/23 11/09/24  Schilling [place], MD  atorvastatin  (LIPITOR) 40 MG tablet Take 1 tablet (40 mg total) by mouth daily at 6 PM. 09/04/23   Schilling [place], MD  atorvastatin  (LIPITOR) 40 MG tablet Take 1 tablet (40 mg total) by mouth daily at 6 PM. 05/24/24     bisacodyl (DULCOLAX) 5 MG EC tablet as needed. 06/08/19   [provider]  bisacodyl (DULCOLAX) 5 MG EC tablet Take by mouth as directed 06/28/24     ciclopirox  (PENLAC ) 8 % solution Apply 1 Application topically daily. 12/25/23     clotrimazole (LOTRIMIN) 1 % cream Apply 1 application topically 2 (two) times daily as needed (skin irritation).    [provider]  dofetilide  (TIKOSYN ) 250 MCG capsule Take  1 capsule (250 mcg total) by mouth 2 (two) times daily. 09/04/23   Lavona Agent, MD  hydrocortisone 1 % lotion Apply 1 application topically daily. For dry skin on face.     [provider]  JARDIANCE  10 MG TABS tablet Take 1 tablet (10 mg total) by mouth daily. 09/04/23   Lavona Agent, MD  losartan  (COZAAR ) 100 MG tablet Take 1 tablet (100 mg total) by mouth daily.  09/04/23   Lavona Agent, MD  magnesium  oxide (MAG-OX) 400 (240 Mg) MG tablet TAKE 2 TABLETS BY MOUTH EVERY DAY 03/27/23   Lavona Agent, MD  metoprolol  tartrate (LOPRESSOR ) 25 MG tablet Take 1 tablet (25 mg total) by mouth 2 (two) times daily. 09/04/23   Lavona Agent, MD  nitrofurantoin , macrocrystal-monohydrate, (MACROBID ) 100 MG capsule Take 1 capsule (100 mg total) by mouth 2 (two) times daily for urinary tract infection. 04/21/24     polyethylene glycol-electrolytes (NULYTELY) 420 g solution take as directed 06/28/24     potassium chloride  SA (KLOR-CON  M20) 20 MEQ tablet Take 1 tablet (20 mEq total) by mouth daily. 09/04/23   Lavona Agent, MD  rivaroxaban  (XARELTO ) 20 MG TABS tablet Take 1 tablet (20 mg total) by mouth daily with supper. 09/04/23   Lavona Agent, MD  Vitamin D , Ergocalciferol , (DRISDOL ) 1.25 MG (50000 UNIT) CAPS capsule Take 50,000 Units by mouth once a week. 01/20/23   [provider]  Vitamin D , Ergocalciferol , (DRISDOL ) 1.25 MG (50000 UNIT) CAPS capsule Take 1 capsule (50,000 Units total) by mouth once a week. Patient not taking: Reported on 07/14/2024 06/21/24       Physical Exam    Vital Signs:  [name] does not have vital signs available for review today.  Given telephonic nature of communication, physical exam is limited. AAOx3. NAD. Normal affect.  Speech and respirations are unlabored.  Accessory Clinical Findings    None  Assessment & Plan    1.  Preoperative Cardiovascular Risk Assessment: According to the Revised Cardiac Risk Index (RCRI), her Perioperative Risk of Major Cardiac Event is (%): 0.4 Her Functional Capacity in METs is: 6.05 according to the Duke Activity Status Index (DASI). Therefore, based on ACC/AHA guidelines, patient would be at acceptable risk for the planned procedure without further cardiovascular testing. I will route this recommendation to the requesting party via Epic fax function.   The patient was  advised that if she develops new symptoms prior to surgery to contact our office to arrange for a follow-up visit, and she verbalized understanding.  Per office protocol, patient may hold Xarelto  for 2 days prior to procedure, resumed as soon as possible.    A copy of this note will be routed to requesting surgeon.  Time:   Today, I have spent 10 minutes with the patient with telehealth technology discussing medical history, symptoms, and management plan.     Delon JAYSON Hoover, NP  07/22/2024, 12:20 PM

## 2024-07-23 ENCOUNTER — Ambulatory Visit
Admission: RE | Admit: 2024-07-23 | Discharge: 2024-07-23 | Disposition: A | Discharge status: Home / Self Care | Source: Ambulatory Visit | Attending: Internal Medicine | Admitting: Internal Medicine

## 2024-07-23 DIAGNOSIS — Z1231 Encounter for screening mammogram for malignant neoplasm of breast: Secondary | ICD-10-CM

## 2024-07-24 DIAGNOSIS — E119 Type 2 diabetes mellitus without complications: Secondary | ICD-10-CM | POA: Diagnosis not present

## 2024-07-24 DIAGNOSIS — H5203 Hypermetropia, bilateral: Secondary | ICD-10-CM | POA: Diagnosis not present

## 2024-07-27 DIAGNOSIS — Z860101 Personal history of adenomatous and serrated colon polyps: Secondary | ICD-10-CM | POA: Diagnosis not present

## 2024-07-27 DIAGNOSIS — Z08 Encounter for follow-up examination after completed treatment for malignant neoplasm: Secondary | ICD-10-CM | POA: Diagnosis not present

## 2024-07-27 DIAGNOSIS — Q438 Other specified congenital malformations of intestine: Secondary | ICD-10-CM | POA: Diagnosis not present

## 2024-07-27 DIAGNOSIS — K644 Residual hemorrhoidal skin tags: Secondary | ICD-10-CM | POA: Diagnosis not present

## 2024-07-27 DIAGNOSIS — K648 Other hemorrhoids: Secondary | ICD-10-CM | POA: Diagnosis not present

## 2024-08-16 ENCOUNTER — Other Ambulatory Visit (HOSPITAL_COMMUNITY): Payer: Self-pay

## 2024-08-19 ENCOUNTER — Other Ambulatory Visit (HOSPITAL_COMMUNITY): Payer: Self-pay

## 2024-08-24 NOTE — Progress Notes (Unsigned)
  Cardiology Office Note:   Date:  08/24/2024  ID:  [id number] [place], [place] 07-Nov-1950, MRN [id number] PCP: Vernon Velna SAUNDERS, MD  [facility] Providers Cardiologist:  Lynwood Schilling, MD Electrophysiologist:  Will Gladis [place], MD {  History of Present Illness:   [name] is a 73 y.o. female  who presents for followup of atrial fibrillation and hypertrophic cardiomyopathy.  She has been followed in the atrial fib clinic.  She had ablation of her atrial fib by Dr. [place].   Since I last saw her she had an MRI.  This demonstrated severe biatrial dilatation.  There is some apical hypertrophy.  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  Maximal LV thickness is 22 mm.    Since I last saw her ***   ***  She states she feels great.  She is doing some exercises.  She does a little light weight lifting.  She shoots some basketball with her grandson.  She walks. The patient denies any new symptoms such as chest discomfort, neck or arm discomfort. There has been no new shortness of breath, PND or orthopnea. There have been no reported palpitations, presyncope or syncope.   ROS: ***  Studies Reviewed:    EKG:       ***  Risk Assessment/Calculations:   {Does this patient have ATRIAL FIBRILLATION?:[phone]} No BP recorded.  {Refresh Note OR Click here to enter BP  :1}***        Physical Exam:   VS:  There were no vitals taken for this visit.   Wt Readings from Last 3 Encounters:  09/04/23 185 lb (83.9 kg)  03/03/23 190 lb 9.6 oz (86.5 kg)  02/25/22 189 lb 9.6 oz (86 kg)     GEN: Well nourished, well developed in no acute distress NECK: No JVD; No carotid bruits CARDIAC: ***RR, *** murmurs, rubs, gallops RESPIRATORY:  Clear to auscultation without rales, wheezing or rhonchi  ABDOMEN: Soft, non-tender, non-distended EXTREMITIES:  No edema; No deformity   ASSESSMENT AND PLAN:   ATRIAL FIB :  ***  She has no symptomatic paroxysms.  No change in therapy.  QT is at  target.    HYPERTENSION, BENIGN :  ***   Her blood pressure is well-controlled.  No change in therapy.   HYPERTROPHIC CARDIOMYOPATHY :   ***  -the patient has had moderate asymmetric left ventricular apical hypertrophy.  This is an apical variant with some aneurysmal dilatation.  She has no other high risk features but I am going to put a 3-day Zio patch on her.  She has not wanted to consider genetic testing.  At this point I see no indication for change in therapy or ICD.            Follow up ***  Signed, Lynwood Schilling, MD

## 2024-08-25 ENCOUNTER — Encounter: Payer: Self-pay | Admitting: Cardiology

## 2024-08-25 ENCOUNTER — Ambulatory Visit: Attending: Cardiology | Admitting: Cardiology

## 2024-08-25 VITALS — BP 160/84 | HR 68 | Ht 63.0 in | Wt 183.0 lb

## 2024-08-25 DIAGNOSIS — I422 Other hypertrophic cardiomyopathy: Secondary | ICD-10-CM

## 2024-08-25 DIAGNOSIS — I4819 Other persistent atrial fibrillation: Secondary | ICD-10-CM

## 2024-08-25 DIAGNOSIS — I10 Essential (primary) hypertension: Secondary | ICD-10-CM | POA: Diagnosis not present

## 2024-08-25 NOTE — Patient Instructions (Signed)
 Medication Instructions:  Your physician recommends that you continue on your current medications as directed. Please refer to the Current Medication list given to you today.  *If you need a refill on your cardiac medications before your next appointment, please call your pharmacy*  Lab Work: None ordered.  If you have labs (blood work) drawn today and your tests are completely normal, you will receive your results only by: MyChart Message (if you have MyChart) OR A paper copy in the mail If you have any lab test that is abnormal or we need to change your treatment, we will call you to review the results.  Testing/Procedures: None ordered.   Follow-Up: At [facility], you and your health needs are our priority.  As part of our continuing mission to provide you with exceptional heart care, our providers are all part of one team.  This team includes your primary Cardiologist (physician) and Advanced Practice Providers or APPs (Physician Assistants and Nurse Practitioners) who all work together to provide you with the care you need, when you need it.  Your next appointment:   12 months with Dr Lavona  Please continue monitoring your blood pressure

## 2024-08-26 ENCOUNTER — Other Ambulatory Visit (HOSPITAL_COMMUNITY): Payer: Self-pay

## 2024-09-06 ENCOUNTER — Other Ambulatory Visit (HOSPITAL_COMMUNITY): Payer: Self-pay

## 2024-09-06 ENCOUNTER — Other Ambulatory Visit: Payer: Self-pay | Admitting: Cardiology

## 2024-09-07 ENCOUNTER — Other Ambulatory Visit (HOSPITAL_COMMUNITY): Payer: Self-pay

## 2024-09-07 MED ORDER — POTASSIUM CHLORIDE CRYS ER 20 MEQ PO TBCR
20.0000 meq | EXTENDED_RELEASE_TABLET | Freq: Every day | ORAL | 3 refills | Status: AC
  Filled 2024-09-07: qty 90, 90d supply, fill #0

## 2024-09-10 ENCOUNTER — Other Ambulatory Visit (HOSPITAL_COMMUNITY): Payer: Self-pay

## 2024-09-13 ENCOUNTER — Other Ambulatory Visit: Payer: Self-pay

## 2024-09-13 ENCOUNTER — Other Ambulatory Visit: Payer: Self-pay | Admitting: Cardiology

## 2024-09-14 ENCOUNTER — Other Ambulatory Visit: Payer: Self-pay

## 2024-09-14 ENCOUNTER — Other Ambulatory Visit (HOSPITAL_COMMUNITY): Payer: Self-pay

## 2024-09-14 MED ORDER — LOSARTAN POTASSIUM 100 MG PO TABS
100.0000 mg | ORAL_TABLET | Freq: Every day | ORAL | 3 refills | Status: AC
  Filled 2024-09-14: qty 90, 90d supply, fill #0

## 2024-10-13 ENCOUNTER — Other Ambulatory Visit (HOSPITAL_COMMUNITY): Payer: Self-pay

## 2024-10-13 ENCOUNTER — Ambulatory Visit (INDEPENDENT_AMBULATORY_CARE_PROVIDER_SITE_OTHER)

## 2024-10-13 ENCOUNTER — Ambulatory Visit: Admission: EM | Admit: 2024-10-13 | Discharge: 2024-10-13 | Disposition: A | Discharge status: Home / Self Care

## 2024-10-13 ENCOUNTER — Other Ambulatory Visit: Payer: Self-pay | Admitting: Cardiology

## 2024-10-13 ENCOUNTER — Encounter: Payer: Self-pay | Admitting: Emergency Medicine

## 2024-10-13 DIAGNOSIS — S93402A Sprain of unspecified ligament of left ankle, initial encounter: Secondary | ICD-10-CM

## 2024-10-13 DIAGNOSIS — E559 Vitamin D deficiency, unspecified: Secondary | ICD-10-CM | POA: Insufficient documentation

## 2024-10-13 DIAGNOSIS — D6869 Other thrombophilia: Secondary | ICD-10-CM | POA: Insufficient documentation

## 2024-10-13 DIAGNOSIS — E2839 Other primary ovarian failure: Secondary | ICD-10-CM | POA: Insufficient documentation

## 2024-10-13 DIAGNOSIS — M8580 Other specified disorders of bone density and structure, unspecified site: Secondary | ICD-10-CM | POA: Insufficient documentation

## 2024-10-13 DIAGNOSIS — I10 Essential (primary) hypertension: Secondary | ICD-10-CM

## 2024-10-13 DIAGNOSIS — Z8673 Personal history of transient ischemic attack (TIA), and cerebral infarction without residual deficits: Secondary | ICD-10-CM | POA: Insufficient documentation

## 2024-10-13 DIAGNOSIS — E1165 Type 2 diabetes mellitus with hyperglycemia: Secondary | ICD-10-CM | POA: Insufficient documentation

## 2024-10-13 DIAGNOSIS — E1169 Type 2 diabetes mellitus with other specified complication: Secondary | ICD-10-CM | POA: Insufficient documentation

## 2024-10-13 DIAGNOSIS — E7800 Pure hypercholesterolemia, unspecified: Secondary | ICD-10-CM | POA: Insufficient documentation

## 2024-10-13 MED ORDER — AMLODIPINE BESYLATE 2.5 MG PO TABS
2.5000 mg | ORAL_TABLET | Freq: Every day | ORAL | 3 refills | Status: AC
  Filled 2024-10-13: qty 90, 90d supply, fill #0

## 2024-10-13 MED ORDER — VITAMIN D (ERGOCALCIFEROL) 1.25 MG (50000 UNIT) PO CAPS
50000.0000 [IU] | ORAL_CAPSULE | ORAL | 3 refills | Status: AC
  Filled 2024-10-13: qty 4, 28d supply, fill #0

## 2024-10-13 MED ORDER — JARDIANCE 10 MG PO TABS
10.0000 mg | ORAL_TABLET | Freq: Every day | ORAL | 3 refills | Status: AC
  Filled 2024-10-13: qty 90, 90d supply, fill #0

## 2024-10-13 NOTE — ED Provider Notes (Signed)
 " [facility]    CSN: [id number] Arrival date & time: 10/13/24  1509      History   Chief Complaint Chief Complaint  Patient presents with   Ankle Pain    HPI [name] is a 74 y.o. female.   Pt presents today due left ankle pain after sliding down her drive way on ice and falling. Pt was able to get herself up and was able to get into the car. Pt states that she took Tylenol  before presenting to office. Pt is able to ambulate without issue.   The history is provided by the patient.  Ankle Pain   Past Medical History:  Diagnosis Date   Atrial fibrillation [facility])    Atrial flutter (HCC)    Hypertension    Left ventricular hypertrophy    possibly hypertrophic cardiomyopathy   Obesity    Stroke [facility])     Patient Active Problem List   Diagnosis Date Noted   Decreased estrogen level 10/13/2024   History of stroke without residual deficits 10/13/2024   Hypercholesterolemia 10/13/2024   Hyperglycemia due to type 2 diabetes mellitus (HCC) 10/13/2024   Osteopenia 10/13/2024   Secondary hypercoagulable state 10/13/2024   Type 2 diabetes mellitus with other specified complication (HCC) 10/13/2024   Vitamin D  deficiency 10/13/2024   Apical variant hypertrophic cardiomyopathy (HCC) 09/03/2023   Overweight 02/25/2021   Educated about COVID-19 virus infection 05/24/2020   Pulmonary HTN (HCC) 12/10/2017   LVH (left ventricular hypertrophy) 12/10/2017   TIA (transient ischemic attack) 09/10/2017   Pulmonary hypertension, primary (HCC) 01/22/2017   Encounter for therapeutic drug monitoring 11/08/2013   Long term (current) use of anticoagulants 08/14/2011   QT prolongation 08/10/2011   Mitral regurgitation 08/09/2011   URI (upper respiratory infection) 08/09/2011   Atrial fibrillation (HCC) 03/09/2009   OBESITY 11/10/2008   HYPERTROPHIC CARDIOMYOPATHY 11/10/2008   HYPERTENSION, BENIGN 10/12/2008   SVT/ PSVT/ PAT 10/12/2008    Past Surgical  History:  Procedure Laterality Date   ABDOMINAL HYSTERECTOMY     ATRIAL FIBRILLATION ABLATION N/A 11/12/2018   Procedure: ATRIAL FIBRILLATION ABLATION;  Surgeon: Inocencio Soyla Lunger, MD;  Location: [facility] INVASIVE CV LAB;  Service: Cardiovascular;  Laterality: N/A;   TEE WITHOUT CARDIOVERSION N/A 11/11/2018   Procedure: TRANSESOPHAGEAL ECHOCARDIOGRAM (TEE);  Surgeon: Mona Vinie BROCKS, MD;  Location: [facility] ENDOSCOPY;  Service: Cardiovascular;  Laterality: N/A;    OB History   No obstetric history on file.      Home Medications    Prior to Admission medications  Medication Sig Start Date End Date Taking? Authorizing Provider  acetaminophen  (TYLENOL ) 500 MG tablet Take 1,000 mg by mouth every 6 (six) hours as needed.   Yes [provider]  AREXVY 120 MCG/0.5ML injection  06/28/24  Yes [provider]  amLODipine  (NORVASC ) 2.5 MG tablet Take 1 tablet (2.5 mg total) by mouth daily. 10/13/24 01/11/25  Lavona Agent, MD  atorvastatin  (LIPITOR) 40 MG tablet Take 1 tablet (40 mg total) by mouth daily at 6 PM. Patient not taking: Reported on 08/25/2024 09/04/23   Lavona Agent, MD  atorvastatin  (LIPITOR) 40 MG tablet Take 1 tablet (40 mg total) by mouth daily at 6 PM. 05/24/24     bisacodyl  (DULCOLAX) 5 MG EC tablet as needed. Patient not taking: Reported on 08/25/2024 06/08/19   [provider]  bisacodyl  (DULCOLAX) 5 MG EC tablet Take by mouth as directed 06/28/24     ciclopirox  (PENLAC ) 8 % solution Apply 1 Application topically  daily. 12/25/23     clotrimazole (LOTRIMIN) 1 % cream Apply 1 application topically 2 (two) times daily as needed (skin irritation).    [provider]  dofetilide  (TIKOSYN ) 250 MCG capsule Take 1 capsule (250 mcg total) by mouth 2 (two) times daily. 09/04/23   Lavona Agent, MD  hydrocortisone 1 % lotion Apply 1 application topically daily. For dry skin on face.     [provider]  JARDIANCE  10 MG TABS tablet Take 1 tablet (10  mg total) by mouth daily. 10/13/24   Lavona Agent, MD  losartan  (COZAAR ) 100 MG tablet Take 1 tablet (100 mg total) by mouth daily. 09/14/24   Lavona Agent, MD  magnesium  oxide (MAG-OX) 400 (240 Mg) MG tablet TAKE 2 TABLETS BY MOUTH EVERY DAY 03/27/23   Lavona Agent, MD  metoprolol  tartrate (LOPRESSOR ) 25 MG tablet Take 1 tablet (25 mg total) by mouth 2 (two) times daily. 09/04/23   Lavona Agent, MD  nitrofurantoin , macrocrystal-monohydrate, (MACROBID ) 100 MG capsule Take 1 capsule (100 mg total) by mouth 2 (two) times daily for urinary tract infection. Patient not taking: Reported on 08/25/2024 04/21/24     polyethylene glycol-electrolytes (NULYTELY) 420 g solution take as directed Patient not taking: Reported on 08/25/2024 06/28/24     potassium chloride  SA (KLOR-CON  M20) 20 MEQ tablet Take 1 tablet (20 mEq total) by mouth daily. 09/07/24   Lavona Agent, MD  rivaroxaban  (XARELTO ) 20 MG TABS tablet Take 1 tablet (20 mg total) by mouth daily with supper. 09/04/23   Lavona Agent, MD  Vitamin D , Ergocalciferol , (DRISDOL ) 1.25 MG (50000 UNIT) CAPS capsule Take 50,000 Units by mouth once a week. Patient not taking: Reported on 08/25/2024 01/20/23   [provider]  Vitamin D , Ergocalciferol , (DRISDOL ) 1.25 MG (50000 UNIT) CAPS capsule Take 1 capsule (50,000 Units total) by mouth once a week. 10/13/24       Family History Family History  Problem Relation Age of Onset   Heart failure Mother    Breast cancer Sister 51 - 43   Diabetes Daughter    Stroke Paternal Grandfather     Social History Social History[1]   Allergies   Penicillin g and Latex   Review of Systems Review of Systems   Physical Exam Triage Vital Signs ED Triage Vitals  Encounter Vitals Group     BP 10/13/24 1535 (!) 170/89     Girls Systolic BP Percentile --      Girls Diastolic BP Percentile --      Boys Systolic BP Percentile --      Boys Diastolic BP Percentile --      Pulse Rate 10/13/24  1535 76     Resp 10/13/24 1535 18     Temp 10/13/24 1535 98.4 F (36.9 C)     Temp Source 10/13/24 1535 Oral     SpO2 10/13/24 1535 94 %     Weight 10/13/24 1535 182 lb 15.7 oz (83 kg)     Height --      Head Circumference --      Peak Flow --      Pain Score 10/13/24 1533 6     Pain Loc --      Pain Education --      Exclude from Growth Chart --    No data found.  Updated Vital Signs BP (!) 170/89 (BP Location: Right Arm)   Pulse 76   Temp 98.4 F (36.9 C) (Oral)   Resp 18  Wt 182 lb 15.7 oz (83 kg)   SpO2 94%   BMI 32.41 kg/m   Visual Acuity Right Eye Distance:   Left Eye Distance:   Bilateral Distance:    Right Eye Near:   Left Eye Near:    Bilateral Near:     Physical Exam Vitals and nursing note reviewed.  Constitutional:      General: She is not in acute distress.    Appearance: Normal appearance. She is not ill-appearing, toxic-appearing or diaphoretic.  Eyes:     General: No scleral icterus. Cardiovascular:     Rate and Rhythm: Normal rate and regular rhythm.     Heart sounds: Normal heart sounds.  Pulmonary:     Effort: Pulmonary effort is normal. No respiratory distress.     Breath sounds: Normal breath sounds. No wheezing or rhonchi.  Musculoskeletal:     Left ankle: No swelling, deformity, ecchymosis or lacerations. Tenderness present over the lateral malleolus. Normal range of motion.     Left Achilles Tendon: No tenderness or defects. Thompson's test negative.  Skin:    General: Skin is warm.  Neurological:     Mental Status: She is alert and oriented to person, place, and time.  Psychiatric:        Mood and Affect: Mood normal.        Behavior: Behavior normal.      UC Treatments / Results  Labs (all labs ordered are listed, but only abnormal results are displayed) Labs Reviewed - No data to display  EKG   Radiology DG Ankle Complete Left Result Date: 10/13/2024 EXAM: 3 or more VIEW(S) XRAY OF THE LEFT ANKLE 10/13/2024 04:01:37  PM CLINICAL HISTORY: Fall; injury. COMPARISON: None available. FINDINGS: BONES AND JOINTS: Small joint effusion. Degenerative changes in the intertarsal joints. SOFT TISSUES: Diffuse soft tissue swelling. IMPRESSION: 1. No acute fracture or dislocation. 2. Small joint effusion and diffuse soft tissue swelling. Electronically signed by: Elsie Gravely MD 10/13/2024 04:38 PM EST RP Workstation: [id number]    Procedures Procedures (including critical care time)  Medications Ordered in UC Medications - No data to display  Initial Impression / Assessment and Plan / UC Course  I have reviewed the triage vital signs and the nursing notes.  Pertinent labs & imaging results that were available during my care of the patient were reviewed by me and considered in my medical decision making (see chart for details).     Final Clinical Impressions(s) / UC Diagnoses   Final diagnoses:  Sprain of left ankle, unspecified ligament, initial encounter     Discharge Instructions      Today you have been diagnosed with a musculoskeletal injury.  You may take 1000 mg of Tylenol  every 6 hrs, no more than 4000 mg.You should use ice on affected area for 20 minutes at a time a couple times a day for the first 24 hours then you may switch to heat in the same intervals.  Be sure to put a barrier between ice or heat source and skin to prevent burns.  May also wrap affected area and Ace bandage if tolerated and appropriate, and elevate above the level of the heart to help reduce swelling.  Do not wrap Ace bandages around neck or torso as wrapping too tight can restrict air movement inability to breathe.  If symptoms do not seem to be improving in 3 to 5 days after following these instructions we need to follow-up with orthopedist or PCP.  ED Prescriptions   None    PDMP not reviewed this encounter.    [1]  Social History Tobacco Use   Smoking status: Former    Types: Cigarettes    Passive exposure:  Past   Smokeless tobacco: Never  Vaping Use   Vaping status: Never Used  Substance Use Topics   Alcohol use: No   Drug use: No     Andra Corean BROCKS, PA-C 10/13/24 1705  "

## 2024-10-13 NOTE — ED Triage Notes (Signed)
 Pt presents c/o L ankle pain x today days. Pt states,  I slipped on some ice today nand hurt my ankle. Pt denies LOC.

## 2024-10-13 NOTE — Discharge Instructions (Addendum)
 Today you have been diagnosed with a musculoskeletal injury.  You may take 1000 mg of Tylenol  every 6 hrs, no more than 4000 mg.You should use ice on affected area for 20 minutes at a time a couple times a day for the first 24 hours then you may switch to heat in the same intervals.  Be sure to put a barrier between ice or heat source and skin to prevent burns.  May also wrap affected area and Ace bandage if tolerated and appropriate, and elevate above the level of the heart to help reduce swelling.  Do not wrap Ace bandages around neck or torso as wrapping too tight can restrict air movement inability to breathe.  If symptoms do not seem to be improving in 3 to 5 days after following these instructions we need to follow-up with orthopedist or PCP.

## 2024-10-14 ENCOUNTER — Other Ambulatory Visit: Payer: Self-pay
